# Patient Record
Sex: Female | Born: 1965 | Race: Black or African American | Hispanic: No | Smoking: Never smoker
Health system: Southern US, Community
[De-identification: ages and names within clinical notes are randomized; demographics above are authoritative.]

## PROBLEM LIST (undated history)

## (undated) DIAGNOSIS — F32A Depression, unspecified: Secondary | ICD-10-CM

## (undated) DIAGNOSIS — F419 Anxiety disorder, unspecified: Secondary | ICD-10-CM

## (undated) DIAGNOSIS — I10 Essential (primary) hypertension: Secondary | ICD-10-CM

## (undated) DIAGNOSIS — E119 Type 2 diabetes mellitus without complications: Secondary | ICD-10-CM

## (undated) DIAGNOSIS — F329 Major depressive disorder, single episode, unspecified: Secondary | ICD-10-CM

## (undated) DIAGNOSIS — M1990 Unspecified osteoarthritis, unspecified site: Secondary | ICD-10-CM

## (undated) DIAGNOSIS — E7800 Pure hypercholesterolemia, unspecified: Secondary | ICD-10-CM

## (undated) HISTORY — PX: TONSILLECTOMY: SUR1361

## (undated) HISTORY — DX: Essential (primary) hypertension: I10

## (undated) HISTORY — DX: Major depressive disorder, single episode, unspecified: F32.9

## (undated) HISTORY — DX: Anxiety disorder, unspecified: F41.9

## (undated) HISTORY — DX: Unspecified osteoarthritis, unspecified site: M19.90

## (undated) HISTORY — DX: Depression, unspecified: F32.A

## (undated) HISTORY — PX: TUBAL LIGATION: SHX77

---

## 1989-02-01 HISTORY — PX: ANKLE SURGERY: SHX546

## 2005-02-15 ENCOUNTER — Emergency Department (HOSPITAL_COMMUNITY): Admission: EM | Admit: 2005-02-15 | Discharge: 2005-02-15 | Payer: Self-pay | Admitting: Emergency Medicine

## 2005-02-25 ENCOUNTER — Ambulatory Visit (HOSPITAL_COMMUNITY): Admission: RE | Admit: 2005-02-25 | Discharge: 2005-02-25 | Payer: Self-pay | Admitting: Orthopaedic Surgery

## 2012-09-06 ENCOUNTER — Ambulatory Visit (INDEPENDENT_AMBULATORY_CARE_PROVIDER_SITE_OTHER): Payer: BC Managed Care – PPO | Admitting: Family Medicine

## 2012-09-06 ENCOUNTER — Encounter: Payer: Self-pay | Admitting: Family Medicine

## 2012-09-06 VITALS — BP 150/90 | HR 93 | Resp 18 | Ht 63.0 in | Wt 249.1 lb

## 2012-09-06 DIAGNOSIS — M1610 Unilateral primary osteoarthritis, unspecified hip: Secondary | ICD-10-CM

## 2012-09-06 DIAGNOSIS — IMO0002 Reserved for concepts with insufficient information to code with codable children: Secondary | ICD-10-CM

## 2012-09-06 DIAGNOSIS — R7309 Other abnormal glucose: Secondary | ICD-10-CM

## 2012-09-06 DIAGNOSIS — R7303 Prediabetes: Secondary | ICD-10-CM

## 2012-09-06 DIAGNOSIS — E8881 Metabolic syndrome: Secondary | ICD-10-CM

## 2012-09-06 DIAGNOSIS — I10 Essential (primary) hypertension: Secondary | ICD-10-CM

## 2012-09-06 DIAGNOSIS — Z96642 Presence of left artificial hip joint: Secondary | ICD-10-CM | POA: Insufficient documentation

## 2012-09-06 DIAGNOSIS — M170 Bilateral primary osteoarthritis of knee: Secondary | ICD-10-CM | POA: Insufficient documentation

## 2012-09-06 DIAGNOSIS — Z23 Encounter for immunization: Secondary | ICD-10-CM

## 2012-09-06 DIAGNOSIS — Z0000 Encounter for general adult medical examination without abnormal findings: Secondary | ICD-10-CM | POA: Insufficient documentation

## 2012-09-06 DIAGNOSIS — M160 Bilateral primary osteoarthritis of hip: Secondary | ICD-10-CM

## 2012-09-06 DIAGNOSIS — N3000 Acute cystitis without hematuria: Secondary | ICD-10-CM

## 2012-09-06 DIAGNOSIS — M169 Osteoarthritis of hip, unspecified: Secondary | ICD-10-CM

## 2012-09-06 DIAGNOSIS — Z1239 Encounter for other screening for malignant neoplasm of breast: Secondary | ICD-10-CM

## 2012-09-06 DIAGNOSIS — M1710 Unilateral primary osteoarthritis, unspecified knee: Secondary | ICD-10-CM

## 2012-09-06 DIAGNOSIS — Z01818 Encounter for other preprocedural examination: Secondary | ICD-10-CM

## 2012-09-06 DIAGNOSIS — E785 Hyperlipidemia, unspecified: Secondary | ICD-10-CM

## 2012-09-06 LAB — POCT URINALYSIS DIPSTICK
Bilirubin, UA: NEGATIVE
Blood, UA: NEGATIVE
Glucose, UA: NEGATIVE
Ketones, UA: NEGATIVE
pH, UA: 6

## 2012-09-06 LAB — CBC
MCH: 28.8 pg (ref 26.0–34.0)
Platelets: 380 10*3/uL (ref 150–400)
RDW: 15.1 % (ref 11.5–15.5)

## 2012-09-06 MED ORDER — TRIAMTERENE-HCTZ 37.5-25 MG PO TABS: 1.0000 | ORAL_TABLET | Freq: Every day | ORAL | Status: DC

## 2012-09-06 MED ORDER — HYDROCODONE-ACETAMINOPHEN 7.5-300 MG PO TABS: 1.0000 | ORAL_TABLET | Freq: Three times a day (TID) | ORAL | Status: DC

## 2012-09-06 NOTE — Patient Instructions (Addendum)
F/u in  [facility] September.  Pls get CXR today  CCUA today , also cbc, chem 7, lipid, HBA1c, TSH and Vit D  You are referred for a mammogram  New for blood pressure is maxzide one daily.    EKG today

## 2012-09-06 NOTE — Progress Notes (Signed)
  Subjective:    Patient ID: Tami Pearson, female    DOB: 1965/08/31, 47 y.o.   MRN: [id number]  HPI Pt in to re  establish care.Was seen for  A brief period over 5 years ago.   Has a h/o HTN but has been off medication for years, states due to lack of insurance. Now has been getting injections in her knee for instability, with no improvement, evaluation by ortho needs bilateral hip replacement, and possibly knee replacements. Chronic uncontrolled pain with instability, very poor quality of life. Excessive weight gain and no exercise. Here also for pre op evaluation    Review of Systems See HPI Denies recent fever or chills. Denies sinus pressure, nasal congestion, ear pain or sore throat. Denies chest congestion, productive cough or wheezing. Denies chest pains, palpitations and leg swelling Denies abdominal pain, nausea, vomiting,diarrhea or constipation.   Denies dysuria, frequency, hesitancy or incontinence.  Denies headaches, seizures, numbness, or tingling. Denies uncontrolled  depression, does have  Insomnia from pain Denies skin break down or rash.        Objective:   Physical Exam Patient alert and oriented and in no cardiopulmonary distress.  HEENT: No facial asymmetry, EOMI, no sinus tenderness,  oropharynx pink and moist.  Neck supple no adenopathy.  Chest: Clear to auscultation bilaterally.  CVS: S1, S2 no murmurs, no S3.  ABD: Soft non tender. Bowel sounds normal.  Ext: No edema  NW:GNFAOZHYQ ROM spine,  hips and knees.  Skin: Intact, no ulcerations or rash noted.  Psych: Good eye contact, normal affect. Memory intact not anxious or depressed appearing.  CNS: CN 2-12 intact, power, tone and sensation normal throughout.        Assessment & Plan:

## 2012-09-07 LAB — LIPID PANEL
HDL: 48 mg/dL (ref >39)
LDL Cholesterol: 128 mg/dL — ABNORMAL HIGH (ref 0–99)
Total CHOL/HDL Ratio: 4 Ratio
VLDL: 15 mg/dL (ref 0–40)

## 2012-09-07 LAB — BASIC METABOLIC PANEL
Chloride: 104 mEq/L (ref 96–112)
Potassium: 3.8 mEq/L (ref 3.5–5.3)

## 2012-09-07 LAB — TSH: TSH: 1.185 u[IU]/mL (ref 0.350–4.500)

## 2012-09-07 LAB — HEMOGLOBIN A1C: Hgb A1c MFr Bld: 6.1 % — ABNORMAL HIGH (ref <5.7)

## 2012-09-07 LAB — VITAMIN D 25 HYDROXY (VIT D DEFICIENCY, FRACTURES): Vit D, 25-Hydroxy: 27 ng/mL — ABNORMAL LOW (ref 30–89)

## 2012-09-09 LAB — URINE CULTURE

## 2012-09-10 ENCOUNTER — Other Ambulatory Visit: Payer: Self-pay | Admitting: Family Medicine

## 2012-09-10 DIAGNOSIS — E8881 Metabolic syndrome: Secondary | ICD-10-CM | POA: Insufficient documentation

## 2012-09-10 DIAGNOSIS — E785 Hyperlipidemia, unspecified: Secondary | ICD-10-CM | POA: Insufficient documentation

## 2012-09-10 DIAGNOSIS — E1169 Type 2 diabetes mellitus with other specified complication: Secondary | ICD-10-CM | POA: Insufficient documentation

## 2012-09-10 HISTORY — DX: Metabolic syndrome: E88.810

## 2012-09-10 HISTORY — DX: Metabolic syndrome: E88.81

## 2012-09-10 NOTE — Assessment & Plan Note (Signed)
Severe, limitation in mobility, surgery scheduled for the Fall

## 2012-09-10 NOTE — Assessment & Plan Note (Signed)
Needs to focus on reduced caloric intake for weight loss, unable to exercise due to severe joint disease

## 2012-09-10 NOTE — Assessment & Plan Note (Addendum)
EKG shows nom ischemic changes, and she is in NSR Urinalysis mildly abn sent for c/s

## 2012-09-10 NOTE — Assessment & Plan Note (Signed)
Needs individual counseling with emphasis on low carb diet and low cal diet for weight loss, metformin would also be beneficial will encourage this also on f/u call with resultss

## 2012-09-10 NOTE — Assessment & Plan Note (Signed)
Uncontrolled, pt to start medication DASH diet and commitment to daily physical activity for a minimum of 30 minutes discussed and encouraged, as a part of hypertension management. The importance of attaining a healthy weight is also discussed.

## 2012-09-10 NOTE — Assessment & Plan Note (Signed)
Needs to lose weight , lower carb and fat intake to improve health and reduce cv risk

## 2012-09-10 NOTE — Assessment & Plan Note (Signed)
elervtaed LDL, needs to lower fat intake

## 2012-09-10 NOTE — Assessment & Plan Note (Signed)
Severe, limiting mobility, being followed by orhto

## 2012-09-13 ENCOUNTER — Other Ambulatory Visit: Payer: Self-pay

## 2012-09-13 MED ORDER — METFORMIN HCL 500 MG PO TABS: 500.0000 mg | ORAL_TABLET | Freq: Every day | ORAL | Status: DC

## 2012-09-19 ENCOUNTER — Encounter: Payer: Self-pay | Admitting: Family Medicine

## 2012-09-22 ENCOUNTER — Telehealth: Payer: Self-pay

## 2012-09-22 NOTE — Telephone Encounter (Signed)
She does want to do the 10mg  and pick up wed

## 2012-09-22 NOTE — Telephone Encounter (Signed)
Left hip pain- hurting so bad and can't sleep at night and has even increased her hydrocodone to four times a day as its still really bothering her. Is there anything else that can be called in or a stronger dose of pain med that can be given. Has to have surgery Oct 15.  [facility] in [place]  [phone]

## 2012-09-22 NOTE — Telephone Encounter (Signed)
I can increase her to the 10mg  hydrocodone dose, but this cannot start before next Wednesday, since she recently was prescribed 90 tablets

## 2012-09-26 ENCOUNTER — Other Ambulatory Visit: Payer: Self-pay | Admitting: Family Medicine

## 2012-09-26 MED ORDER — HYDROCODONE-ACETAMINOPHEN 10-325 MG PO TABS: ORAL_TABLET | ORAL | Status: DC

## 2012-09-26 NOTE — Telephone Encounter (Signed)
Script printed and signed , pt may collect on 09/27/2012

## 2012-09-27 NOTE — Telephone Encounter (Signed)
Script sent in per dr to [facility] in [place]

## 2012-10-18 ENCOUNTER — Ambulatory Visit: Payer: BC Managed Care – PPO | Admitting: Family Medicine

## 2012-10-19 ENCOUNTER — Encounter: Payer: Self-pay | Admitting: Family Medicine

## 2012-10-19 ENCOUNTER — Ambulatory Visit (INDEPENDENT_AMBULATORY_CARE_PROVIDER_SITE_OTHER): Payer: BC Managed Care – PPO | Admitting: Family Medicine

## 2012-10-19 ENCOUNTER — Ambulatory Visit (HOSPITAL_COMMUNITY)
Admission: RE | Admit: 2012-10-19 | Discharge: 2012-10-19 | Disposition: A | Discharge status: Home / Self Care | Payer: BC Managed Care – PPO | Source: Ambulatory Visit | Attending: Family Medicine | Admitting: Family Medicine

## 2012-10-19 VITALS — BP 140/96 | HR 98 | Resp 16 | Wt 241.4 lb

## 2012-10-19 DIAGNOSIS — R7303 Prediabetes: Secondary | ICD-10-CM

## 2012-10-19 DIAGNOSIS — I10 Essential (primary) hypertension: Secondary | ICD-10-CM

## 2012-10-19 DIAGNOSIS — M169 Osteoarthritis of hip, unspecified: Secondary | ICD-10-CM

## 2012-10-19 DIAGNOSIS — Z1231 Encounter for screening mammogram for malignant neoplasm of breast: Secondary | ICD-10-CM | POA: Insufficient documentation

## 2012-10-19 DIAGNOSIS — Z1239 Encounter for other screening for malignant neoplasm of breast: Secondary | ICD-10-CM

## 2012-10-19 DIAGNOSIS — M160 Bilateral primary osteoarthritis of hip: Secondary | ICD-10-CM

## 2012-10-19 DIAGNOSIS — R7309 Other abnormal glucose: Secondary | ICD-10-CM

## 2012-10-19 MED ORDER — HYDROCODONE-ACETAMINOPHEN 10-325 MG PO TABS: 1.0000 | ORAL_TABLET | Freq: Four times a day (QID) | ORAL | Status: DC | PRN

## 2012-10-19 MED ORDER — KETOROLAC TROMETHAMINE 60 MG/2ML IJ SOLN
60.0000 mg | Freq: Once | INTRAMUSCULAR | Status: AC
  Administered 2012-10-19: 60 mg via INTRAMUSCULAR

## 2012-10-19 MED ORDER — TRIAMTERENE-HCTZ 75-50 MG PO TABS: 1.0000 | ORAL_TABLET | Freq: Every day | ORAL | Status: DC

## 2012-10-19 NOTE — Progress Notes (Signed)
  Subjective:    Patient ID: [name], female    DOB: [birth year], 47 y.o.   MRN: [id number]  HPI  Pt in primarily for re eval of HTn as needs this controlled for upcoming hip replacement. No adverse s/e with medications noted. Still has disabling hip pain and requests med adjustment  Review of Systems See HPI Denies recent fever or chills. Denies sinus pressure, nasal congestion, ear pain or sore throat. Denies chest congestion, productive cough or wheezing. Denies chest pains, palpitations and leg swelling Denies abdominal pain, nausea, vomiting,diarrhea or constipation.   Denies dysuria, frequency, hesitancy or incontinence.        Objective:   Physical Exam Patient alert and oriented and in no cardiopulmonary distress.Pt in pain  HEENT: No facial asymmetry, EOMI, no sinus tenderness,  oropharynx pink and moist.  Neck supple no adenopathy.  Chest: Clear to auscultation bilaterally.  CVS: S1, S2 no murmurs, no S3.  ABD: Soft non tender. Bowel sounds normal.  Ext: No edema  MS: Decreased ROM  hips and knees.  Skin: Intact, no ulcerations or rash noted.  Psych: Good eye contact, normal affect. Memory intact not anxious or depressed appearing.  CNS: CN 2-12 intact, power, tone and sensation normal throughout.        Assessment & Plan:

## 2012-10-19 NOTE — Patient Instructions (Addendum)
F/u in 2 weeks , call if you need me before  New direction for pain medication is one tablet every 6 hours, (4 times daily, start today)  New higher dose of blood pressure medication. Take TWO 25mg  maxzide tablets once daily, starting tomorrow , until done, your new tablet is maxzide 50mg  ONE daily  Toradol 60mg  Im in the office today for pain  Flu vaccine today

## 2012-10-21 NOTE — Assessment & Plan Note (Signed)
disablinhg , has replacement scheduled for 10/15, increase dose of med, toradol in office today

## 2012-10-21 NOTE — Assessment & Plan Note (Signed)
uncontrolled increase dose of medication, 2 week f/u

## 2012-10-21 NOTE — Assessment & Plan Note (Signed)
Tolerating metformin with excellent weight loss , continue same

## 2012-10-27 ENCOUNTER — Other Ambulatory Visit: Payer: Self-pay | Admitting: Orthopedic Surgery

## 2012-10-27 NOTE — H&P (Signed)
Tami Pearson  DOB: [birth year] Married / Language: English / Race: Black or African American Female  Date of Admission:  11/15/2012  Chief Complaint: Left Hip Pain  History of Present Illness The patient is a 47 year old female who comes in for a preoperative History and Physical. The patient is scheduled for a left total hip arthroplasty to be performed by Tami. Gus Pearson. Tami Pearson at [facility] on 11/15/2012. The patient is a 47 year old female who presents with a hip problem. The patient is seen in referral from Tami Pearson in [place].The patient reports left hip and right hip problems including pain, giving way (left), catching and stiffness symptoms that have been present for year(s). The symptoms began without any known injury. Symptoms reported include hip pain, pain with weightbearing, difficulty flexing hip, difficulty rotating hip and difficulty bearing weightThe symptoms are described as severe.The patient feels as if their symptoms are does feel they are worsening. Symptoms are exacerbated by movement, flexing hip and weight bearing. Symptoms are not relieved by non-opioid analgesics (Tylenol). Prior to being seen today the patient was previously evaluated by an out of town physician (Tami Pearson). Previous workup for this problem has included hip x-rays (Tami Tami Pearson office). Previous treatment for this problem has included oral corticosteroids. Tami Pearson comes in for evaluation of both hips. They have been hurting for years but was progressivley gotten worse with time. She ahs been seen and treated and thought the pain was initially coming form her back but through workup and evaluation, it was determined that the pain has been coming from her hips. She has been treated with medications including analgesics and oral steroid medications without benefit. She has also undergone intraarticular cortisone injections into both hips. She states the injections helped  only for a couple of days and then the pain came right back. She was referred over to Tami. Lequita Pearson by Tami. Eduard Pearson and is her to get her hips evaulated and discuss surgery. 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 She has not missed work but the pain has interferred with her walking and house work. The left hip is the worse of the two and now it has started to give out on her. She can't walk or even stand for any length of time. She has night pain, trouble getting in and out of the car, more difficulty with shoes and socks, and bending. She is ready to get the hip fixed at this time.  They have been treated conservatively in the past for the above stated problem and despite conservative measures, they continue to have progressive pain and severe functional limitations and dysfunction. They have failed non-operative management including home exercise, medications. It is felt that they would benefit from undergoing total joint replacement. Risks and benefits of the procedure have been discussed with the patient and they elect to proceed with surgery. There are no active contraindications to surgery such as ongoing infection or rapidly progressive neurological disease.   Problem List Osteoarthritis, Hip (715.35)   Allergies Ibuprofen *ANALGESICS - ANTI-INFLAMMATORY*. Vomiting.   Family History Hypertension. mother Osteoarthritis. mother Diabetes Mellitus. mother   Social History Marital status. married Number of flights of stairs before winded. 1 Living situation. live with parents Exercise. Exercises rarely; does running / walking Illicit drug use. no Pain Contract. no Tobacco / smoke exposure. no Tobacco use. never smoker Drug/Alcohol Rehab (Currently). no Drug/Alcohol Rehab (Previously). no Current work status. working full time Alcohol use.  never consumed alcohol   Medication History MetFORMIN HCl (500MG  Tablet, Oral)  Active. Hydrocodone-Acetaminophen (10-325MG  Tablet, Oral) Active. Triamterene-HCTZ (75-50MG  Tablet, Oral) Active.   Past Surgical History Ankle Surgery. Date: 10. right Tubal Ligation. Date: 33. Hardware Removal Right Ankle   Medical History High blood pressure Osteoarthritis Diet-Controlled Diabetes Mellitus   Review of Systems General:Not Present- Chills, Fever, Night Sweats, Fatigue, Weight Gain, Weight Loss and Memory Loss. Skin:Not Present- Hives, Itching, Rash, Eczema and Lesions. HEENT:Not Present- Tinnitus, Headache, Double Vision, Visual Loss, Hearing Loss and Dentures. Respiratory:Not Present- Shortness of breath with exertion, Shortness of breath at rest, Allergies, Coughing up blood and Chronic Cough. Cardiovascular:Not Present- Chest Pain, Racing/skipping heartbeats, Difficulty Breathing Lying Down, Murmur, Swelling and Palpitations. Gastrointestinal:Not Present- Bloody Stool, Heartburn, Abdominal Pain, Vomiting, Nausea, Constipation, Diarrhea, Difficulty Swallowing, Jaundice and Loss of appetitie. Female Genitourinary:Not Present- Blood in Urine, Urinary frequency, Weak urinary stream, Discharge, Flank Pain, Incontinence, Painful Urination, Urgency, Urinary Retention and Urinating at Night. Musculoskeletal:Present- Joint Pain. Not Present- Muscle Weakness, Muscle Pain, Joint Swelling, Back Pain, Morning Stiffness and Spasms. Neurological:Not Present- Tremor, Dizziness, Blackout spells, Paralysis, Difficulty with balance and Weakness. Psychiatric:Not Present- Insomnia.   Vitals Pulse: 72 (Regular) Resp.: 16 (Unlabored) BP: 138/92 (Sitting, Right Arm, Standard)    Physical Exam The physical exam findings are as follows:   General Mental Status - Alert, cooperative and good historian. General Appearance- pleasant. Not in acute distress. Orientation- Oriented X3. Build & Nutrition- Overweight, Obese and Well developed.   Head  and Neck Head- normocephalic, atraumatic . Neck Global Assessment- supple. no bruit auscultated on the right and no bruit auscultated on the left.   Eye Pupil- Bilateral- Regular and Round. Motion- Bilateral- EOMI.   Chest and Lung Exam Auscultation: Breath sounds:- clear at anterior chest wall and - clear at posterior chest wall. Adventitious sounds:- No Adventitious sounds.   Cardiovascular Auscultation:Rhythm- Regular rate and rhythm. Heart Sounds- S1 WNL and S2 WNL. Murmurs & Other Heart Sounds:Auscultation of the heart reveals - No Murmurs.   Abdomen Palpation/Percussion:Tenderness- Abdomen is non-tender to palpation. Rigidity (guarding)- Abdomen is soft. Auscultation:Auscultation of the abdomen reveals - Bowel sounds normal.   Female Genitourinary  Not done, not pertinent to present illness  Musculoskeletal Well developed female alert and oriented in no apparent distress. Evaluation of her left hip flexion 90, no internal or external rotation, only about 5 degrees of abduction. Right hip flexion 110, rotation in 10, out 20, abduction 20 with discomfort. She does not have any trochanteric tenderness on either side. Bilateral knee exams are unremarkable except for slight anterior tenderness on the left knee. There is no effusion in either knee. There is no instability noted. Pulses, sensation and motor are intact.  RADIOGRAPHS: AP pelvis, lateral of both hips show severe endstage arthritis in both hips, left being worse than the right. She has severe bone on bone changes with bony erosion of the femoral head. She has large osteophytes present. The femoral head morphology still intact in the right hip but there is bone on bone change with osteophyte formation.  Assessment & Plan Osteoarthritis, Hip (715.35) Impression: Left Hip Current Plans l Pt Education - How to access health information online: discussed with patient and provided  information.  Note: Plan is for a Left Total Hip Replacement by Tami. Lequita Pearson.  Plan is to go home with family.  PCP - Tami. Syliva Overman  The patient does not have any contraindications and will receive TXA (tranexamic acid) prior to surgery.  Signed electronically by Lauraine Rinne, III PA-C

## 2012-10-30 ENCOUNTER — Other Ambulatory Visit: Payer: Self-pay | Admitting: Orthopedic Surgery

## 2012-11-02 ENCOUNTER — Encounter: Payer: Self-pay | Admitting: Family Medicine

## 2012-11-02 ENCOUNTER — Ambulatory Visit (INDEPENDENT_AMBULATORY_CARE_PROVIDER_SITE_OTHER): Payer: BC Managed Care – PPO | Admitting: Family Medicine

## 2012-11-02 VITALS — BP 130/92 | HR 76 | Resp 18 | Ht 63.0 in | Wt 238.0 lb

## 2012-11-02 DIAGNOSIS — R7303 Prediabetes: Secondary | ICD-10-CM

## 2012-11-02 DIAGNOSIS — R7309 Other abnormal glucose: Secondary | ICD-10-CM

## 2012-11-02 DIAGNOSIS — I10 Essential (primary) hypertension: Secondary | ICD-10-CM

## 2012-11-02 NOTE — Patient Instructions (Addendum)
CPE and pap mid January, call if you need me before  All the best with your upcoming surgery, I will send a clearance note to your surgeon  No change in blood pressure medication and metformin  Non fast HBA1C and chem 7 in January before follow up visit

## 2012-11-02 NOTE — Assessment & Plan Note (Signed)
Imoroved, pt cleared for surgery, will send note

## 2012-11-04 NOTE — Assessment & Plan Note (Signed)
Improved. Pt applauded on succesful weight loss through lifestyle change, and encouraged to continue same. Weight loss goal set for the next several months.  

## 2012-11-04 NOTE — Progress Notes (Signed)
  Subjective:    Patient ID: [name], female    DOB: Jun 18, 1965, 47 y.o.   MRN: [id number]  HPI Pt in for re evaluation of uncontrolled BP prior to hip replacement in the next 2 weeks. She has tolerated her increased med dose with no adverse side effects. She reports adequate pain control with current meds   Review of Systems See HPI Denies recent fever or chills. Denies sinus pressure, nasal congestion, ear pain or sore throat. Denies chest congestion, productive cough or wheezing. Denies chest pains, palpitations and leg swelling Denies abdominal pain, nausea, vomiting,diarrhea or constipation.   Denies dysuria, frequency, hesitancy or incontinence.        Objective:   Physical Exam  Patient alert and oriented and in no cardiopulmonary distress.  HEENT: No facial asymmetry, EOMI, no sinus tenderness,  oropharynx pink and moist.  Neck supple no adenopathy.  Chest: Clear to auscultation bilaterally.  CVS: S1, S2 no murmurs, no S3.  ABD: Soft non tender. Bowel sounds normal.  Ext: No edema  MS: decreased  ROM spine,, hips and knees.  Skin: Intact, no ulcerations or rash noted.  Psych: Good eye contact, normal affect. Memory intact not anxious or depressed appearing.  CNS: CN 2-12 intact, power, tone and sensation normal throughout.       Assessment & Plan:

## 2012-11-06 ENCOUNTER — Other Ambulatory Visit (HOSPITAL_COMMUNITY): Payer: Self-pay | Admitting: Orthopedic Surgery

## 2012-11-06 ENCOUNTER — Encounter (HOSPITAL_COMMUNITY): Payer: Self-pay | Admitting: Pharmacy Technician

## 2012-11-06 NOTE — Patient Instructions (Addendum)
20 Tami Pearson  11/06/2012   Your procedure is scheduled on:  11/15/12  [facility]  Report to [facility] at  0600     AM.  Call this number if you have problems the morning of surgery: [phone]       Remember:   Do not eat food  Or drink :After Midnight. Tuesday NIGHT   Take these medicines the morning of surgery with A SIP OF WATER: MAY TAKE HYDROCODONE IF NEEDED              DO NOT TAKE ANY DIABETES MEDICINE MORNING OF SURGERY  .  Contacts, dentures or partial plates can not be worn to surgery  Leave suitcase in the car. After surgery it may be brought to your room.  For patients admitted to the hospital, checkout time is 11:00 AM day of  discharge.             SPECIAL INSTRUCTIONS- SEE [place] PREPARING FOR SURGERY INSTRUCTION SHEET-     DO NOT WEAR JEWELRY, LOTIONS, POWDERS, OR PERFUMES.  WOMEN-- DO NOT SHAVE LEGS OR UNDERARMS FOR 12 HOURS BEFORE SHOWERS. MEN MAY SHAVE FACE.  Patients discharged the day of surgery will not be allowed to drive home. IF going home the day of surgery, you must have a driver and someone to stay with you for the first 24 hours  Name and phone number of your driver:    ADMISSION                                                                    Please read over the following fact sheets that you were given: MRSA Information, Incentive Spirometry Sheet, Blood Transfusion Sheet  Information                                                                                    Telford Nab.Tami Fojtik, RN,BSN PST 336  [id number]                 FAILURE TO FOLLOW THESE INSTRUCTIONS MAY RESULT IN  CANCELLATION   OF YOUR SURGERY                                                  Patient Signature _____________________________

## 2012-11-07 ENCOUNTER — Ambulatory Visit (HOSPITAL_COMMUNITY)
Admission: RE | Admit: 2012-11-07 | Discharge: 2012-11-07 | Disposition: A | Discharge status: Home / Self Care | Payer: BC Managed Care – PPO | Source: Ambulatory Visit | Attending: Orthopedic Surgery | Admitting: Orthopedic Surgery

## 2012-11-07 ENCOUNTER — Encounter (HOSPITAL_COMMUNITY)
Admission: RE | Admit: 2012-11-07 | Discharge: 2012-11-07 | Disposition: A | Discharge status: Home / Self Care | Payer: BC Managed Care – PPO | Source: Ambulatory Visit | Attending: Orthopedic Surgery | Admitting: Orthopedic Surgery

## 2012-11-07 ENCOUNTER — Encounter (HOSPITAL_COMMUNITY): Payer: Self-pay

## 2012-11-07 DIAGNOSIS — M169 Osteoarthritis of hip, unspecified: Secondary | ICD-10-CM | POA: Insufficient documentation

## 2012-11-07 DIAGNOSIS — E119 Type 2 diabetes mellitus without complications: Secondary | ICD-10-CM | POA: Insufficient documentation

## 2012-11-07 DIAGNOSIS — I10 Essential (primary) hypertension: Secondary | ICD-10-CM | POA: Insufficient documentation

## 2012-11-07 DIAGNOSIS — M5137 Other intervertebral disc degeneration, lumbosacral region: Secondary | ICD-10-CM | POA: Insufficient documentation

## 2012-11-07 DIAGNOSIS — M1610 Unilateral primary osteoarthritis, unspecified hip: Secondary | ICD-10-CM | POA: Insufficient documentation

## 2012-11-07 DIAGNOSIS — M51379 Other intervertebral disc degeneration, lumbosacral region without mention of lumbar back pain or lower extremity pain: Secondary | ICD-10-CM | POA: Insufficient documentation

## 2012-11-07 DIAGNOSIS — Z01812 Encounter for preprocedural laboratory examination: Secondary | ICD-10-CM | POA: Insufficient documentation

## 2012-11-07 DIAGNOSIS — I517 Cardiomegaly: Secondary | ICD-10-CM | POA: Insufficient documentation

## 2012-11-07 HISTORY — DX: Type 2 diabetes mellitus without complications: E11.9

## 2012-11-07 LAB — SURGICAL PCR SCREEN
MRSA, PCR: NEGATIVE
Staphylococcus aureus: NEGATIVE

## 2012-11-07 LAB — PROTIME-INR: Prothrombin Time: 13.1 seconds (ref 11.6–15.2)

## 2012-11-07 LAB — CBC
MCHC: 33 g/dL (ref 30.0–36.0)
RBC: 4.23 MIL/uL (ref 3.87–5.11)
RDW: 12.9 % (ref 11.5–15.5)

## 2012-11-07 LAB — COMPREHENSIVE METABOLIC PANEL
ALT: 9 U/L (ref 0–35)
AST: 14 U/L (ref 0–37)
Albumin: 3.4 g/dL — ABNORMAL LOW (ref 3.5–5.2)
Alkaline Phosphatase: 66 U/L (ref 39–117)
BUN: 19 mg/dL (ref 6–23)
Potassium: 3.4 mEq/L — ABNORMAL LOW (ref 3.5–5.1)
Sodium: 136 mEq/L (ref 135–145)
Total Protein: 9.1 g/dL — ABNORMAL HIGH (ref 6.0–8.3)

## 2012-11-07 LAB — URINALYSIS, ROUTINE W REFLEX MICROSCOPIC
Bilirubin Urine: NEGATIVE
Nitrite: NEGATIVE
Protein, ur: NEGATIVE mg/dL
Specific Gravity, Urine: 1.011 (ref 1.005–1.030)
Urobilinogen, UA: 0.2 mg/dL (ref 0.0–1.0)
pH: 6.5 (ref 5.0–8.0)

## 2012-11-07 LAB — URINE MICROSCOPIC-ADD ON

## 2012-11-07 LAB — HCG, SERUM, QUALITATIVE: Preg, Serum: NEGATIVE

## 2012-11-07 LAB — APTT: aPTT: 30 seconds (ref 24–37)

## 2012-11-08 ENCOUNTER — Telehealth: Payer: Self-pay | Admitting: Family Medicine

## 2012-11-08 LAB — URINE CULTURE

## 2012-11-08 NOTE — Telephone Encounter (Signed)
Sent over

## 2012-11-08 NOTE — Telephone Encounter (Signed)
Please advise 

## 2012-11-08 NOTE — Telephone Encounter (Signed)
Script written , pls fax and let her know 

## 2012-11-08 NOTE — Telephone Encounter (Signed)
Called patient.  Unable to leave message.  Will try again to get DME supplier choice.

## 2012-11-08 NOTE — Telephone Encounter (Signed)
Wants an rx for a bath chair also sent to [facility]

## 2012-11-08 NOTE — Telephone Encounter (Signed)
Signed pls send

## 2012-11-08 NOTE — Telephone Encounter (Signed)
Wants it faxed to [facility]

## 2012-11-15 ENCOUNTER — Encounter (HOSPITAL_COMMUNITY): Payer: BC Managed Care – PPO | Admitting: Registered Nurse

## 2012-11-15 ENCOUNTER — Inpatient Hospital Stay (HOSPITAL_COMMUNITY): Payer: BC Managed Care – PPO | Admitting: Registered Nurse

## 2012-11-15 ENCOUNTER — Inpatient Hospital Stay (HOSPITAL_COMMUNITY)
Admission: RE | Admit: 2012-11-15 | Discharge: 2012-11-19 | DRG: 470 | Disposition: A | Discharge status: Home Health | Payer: BC Managed Care – PPO | Source: Ambulatory Visit | Attending: Orthopedic Surgery | Admitting: Orthopedic Surgery

## 2012-11-15 ENCOUNTER — Inpatient Hospital Stay (HOSPITAL_COMMUNITY): Payer: BC Managed Care – PPO

## 2012-11-15 ENCOUNTER — Encounter (HOSPITAL_COMMUNITY)
Admission: RE | Disposition: A | Discharge status: Home Health | Payer: Self-pay | Source: Ambulatory Visit | Attending: Orthopedic Surgery

## 2012-11-15 ENCOUNTER — Encounter (HOSPITAL_COMMUNITY): Payer: Self-pay | Admitting: *Deleted

## 2012-11-15 DIAGNOSIS — E669 Obesity, unspecified: Secondary | ICD-10-CM | POA: Diagnosis present

## 2012-11-15 DIAGNOSIS — Z79899 Other long term (current) drug therapy: Secondary | ICD-10-CM

## 2012-11-15 DIAGNOSIS — M169 Osteoarthritis of hip, unspecified: Secondary | ICD-10-CM | POA: Diagnosis present

## 2012-11-15 DIAGNOSIS — E871 Hypo-osmolality and hyponatremia: Secondary | ICD-10-CM

## 2012-11-15 DIAGNOSIS — I10 Essential (primary) hypertension: Secondary | ICD-10-CM | POA: Diagnosis present

## 2012-11-15 DIAGNOSIS — E119 Type 2 diabetes mellitus without complications: Secondary | ICD-10-CM | POA: Diagnosis present

## 2012-11-15 DIAGNOSIS — E876 Hypokalemia: Secondary | ICD-10-CM

## 2012-11-15 DIAGNOSIS — Z96649 Presence of unspecified artificial hip joint: Secondary | ICD-10-CM

## 2012-11-15 DIAGNOSIS — Z6841 Body Mass Index (BMI) 40.0 and over, adult: Secondary | ICD-10-CM

## 2012-11-15 DIAGNOSIS — M1610 Unilateral primary osteoarthritis, unspecified hip: Principal | ICD-10-CM | POA: Diagnosis present

## 2012-11-15 DIAGNOSIS — D62 Acute posthemorrhagic anemia: Secondary | ICD-10-CM

## 2012-11-15 HISTORY — PX: TOTAL HIP ARTHROPLASTY: SHX124

## 2012-11-15 LAB — TYPE AND SCREEN
ABO/RH(D): B POS
Antibody Screen: NEGATIVE

## 2012-11-15 LAB — GLUCOSE, CAPILLARY
Glucose-Capillary: 138 mg/dL — ABNORMAL HIGH (ref 70–99)
Glucose-Capillary: 119 mg/dL — ABNORMAL HIGH (ref 70–99)

## 2012-11-15 SURGERY — ARTHROPLASTY, HIP, TOTAL,POSTERIOR APPROACH
Anesthesia: Spinal | Site: Hip | Laterality: Left | Wound class: Clean

## 2012-11-15 MED ORDER — CEFAZOLIN SODIUM-DEXTROSE 2-3 GM-% IV SOLR
INTRAVENOUS | Status: AC
  Filled 2012-11-15: qty 50

## 2012-11-15 MED ORDER — RIVAROXABAN 10 MG PO TABS
10.0000 mg | ORAL_TABLET | Freq: Every day | ORAL | Status: DC
  Administered 2012-11-16 – 2012-11-19 (×4): 10 mg via ORAL
  Filled 2012-11-15 (×5): qty 1

## 2012-11-15 MED ORDER — TRANEXAMIC ACID 100 MG/ML IV SOLN
1000.0000 mg | INTRAVENOUS | Status: AC
  Administered 2012-11-15: 1000 mg via INTRAVENOUS
  Filled 2012-11-15: qty 10

## 2012-11-15 MED ORDER — LACTATED RINGERS IV SOLN
INTRAVENOUS | Status: DC | PRN
  Administered 2012-11-15 (×3): via INTRAVENOUS

## 2012-11-15 MED ORDER — MENTHOL 3 MG MT LOZG: 1.0000 | LOZENGE | OROMUCOSAL | Status: DC | PRN

## 2012-11-15 MED ORDER — PHENOL 1.4 % MT LIQD: 1.0000 | OROMUCOSAL | Status: DC | PRN

## 2012-11-15 MED ORDER — MORPHINE SULFATE 2 MG/ML IJ SOLN
1.0000 mg | INTRAMUSCULAR | Status: DC | PRN
  Administered 2012-11-15 (×2): 2 mg via INTRAVENOUS
  Filled 2012-11-15: qty 1

## 2012-11-15 MED ORDER — PROPOFOL INFUSION 10 MG/ML OPTIME
INTRAVENOUS | Status: DC | PRN
  Administered 2012-11-15: 50 ug/kg/min via INTRAVENOUS

## 2012-11-15 MED ORDER — POLYETHYLENE GLYCOL 3350 17 G PO PACK: 17.0000 g | PACK | Freq: Every day | ORAL | Status: DC | PRN

## 2012-11-15 MED ORDER — DEXAMETHASONE SODIUM PHOSPHATE 10 MG/ML IJ SOLN: 10.0000 mg | Freq: Once | INTRAMUSCULAR | Status: DC

## 2012-11-15 MED ORDER — BUPIVACAINE IN DEXTROSE 0.75-8.25 % IT SOLN
INTRATHECAL | Status: DC | PRN
  Administered 2012-11-15: 2 mL via INTRATHECAL

## 2012-11-15 MED ORDER — DOCUSATE SODIUM 100 MG PO CAPS
100.0000 mg | ORAL_CAPSULE | Freq: Two times a day (BID) | ORAL | Status: DC
  Administered 2012-11-15 – 2012-11-19 (×8): 100 mg via ORAL

## 2012-11-15 MED ORDER — CEFAZOLIN SODIUM-DEXTROSE 2-3 GM-% IV SOLR
2.0000 g | Freq: Four times a day (QID) | INTRAVENOUS | Status: AC
  Administered 2012-11-15 (×2): 2 g via INTRAVENOUS
  Filled 2012-11-15 (×2): qty 50

## 2012-11-15 MED ORDER — TRIAMTERENE-HCTZ 75-50 MG PO TABS
1.0000 | ORAL_TABLET | Freq: Every morning | ORAL | Status: DC
  Administered 2012-11-16 – 2012-11-19 (×4): 1 via ORAL
  Filled 2012-11-15 (×4): qty 1

## 2012-11-15 MED ORDER — INSULIN ASPART 100 UNIT/ML ~~LOC~~ SOLN
0.0000 [IU] | Freq: Three times a day (TID) | SUBCUTANEOUS | Status: DC
  Administered 2012-11-15 – 2012-11-19 (×7): 2 [IU] via SUBCUTANEOUS

## 2012-11-15 MED ORDER — TRAMADOL HCL 50 MG PO TABS: 50.0000 mg | ORAL_TABLET | Freq: Four times a day (QID) | ORAL | Status: DC | PRN

## 2012-11-15 MED ORDER — METHOCARBAMOL 500 MG PO TABS
500.0000 mg | ORAL_TABLET | Freq: Four times a day (QID) | ORAL | Status: DC | PRN
  Administered 2012-11-16 – 2012-11-19 (×9): 500 mg via ORAL
  Filled 2012-11-15 (×8): qty 1

## 2012-11-15 MED ORDER — SODIUM CHLORIDE 0.9 % IJ SOLN
INTRAMUSCULAR | Status: AC
  Filled 2012-11-15: qty 50

## 2012-11-15 MED ORDER — FLEET ENEMA 7-19 GM/118ML RE ENEM: 1.0000 | ENEMA | Freq: Once | RECTAL | Status: AC | PRN

## 2012-11-15 MED ORDER — METOCLOPRAMIDE HCL 5 MG/ML IJ SOLN: 5.0000 mg | Freq: Three times a day (TID) | INTRAMUSCULAR | Status: DC | PRN

## 2012-11-15 MED ORDER — BISACODYL 10 MG RE SUPP: 10.0000 mg | Freq: Every day | RECTAL | Status: DC | PRN

## 2012-11-15 MED ORDER — ONDANSETRON HCL 4 MG/2ML IJ SOLN: 4.0000 mg | Freq: Four times a day (QID) | INTRAMUSCULAR | Status: DC | PRN

## 2012-11-15 MED ORDER — OXYCODONE HCL 5 MG PO TABS
5.0000 mg | ORAL_TABLET | ORAL | Status: DC | PRN
  Administered 2012-11-15 – 2012-11-19 (×17): 10 mg via ORAL
  Filled 2012-11-15 (×16): qty 2

## 2012-11-15 MED ORDER — ACETAMINOPHEN 500 MG PO TABS
1000.0000 mg | ORAL_TABLET | Freq: Once | ORAL | Status: AC
  Administered 2012-11-15: 1000 mg via ORAL
  Filled 2012-11-15: qty 2

## 2012-11-15 MED ORDER — METOCLOPRAMIDE HCL 10 MG PO TABS: 5.0000 mg | ORAL_TABLET | Freq: Three times a day (TID) | ORAL | Status: DC | PRN

## 2012-11-15 MED ORDER — MIDAZOLAM HCL 5 MG/5ML IJ SOLN
INTRAMUSCULAR | Status: DC | PRN
  Administered 2012-11-15 (×2): 2 mg via INTRAVENOUS

## 2012-11-15 MED ORDER — ONDANSETRON HCL 4 MG PO TABS: 4.0000 mg | ORAL_TABLET | Freq: Four times a day (QID) | ORAL | Status: DC | PRN

## 2012-11-15 MED ORDER — FENTANYL CITRATE 0.05 MG/ML IJ SOLN
INTRAMUSCULAR | Status: DC | PRN
  Administered 2012-11-15: 100 ug via INTRAVENOUS
  Administered 2012-11-15 (×2): 50 ug via INTRAVENOUS

## 2012-11-15 MED ORDER — DIPHENHYDRAMINE HCL 12.5 MG/5ML PO ELIX: 12.5000 mg | ORAL_SOLUTION | ORAL | Status: DC | PRN

## 2012-11-15 MED ORDER — MORPHINE SULFATE 2 MG/ML IJ SOLN
INTRAMUSCULAR | Status: AC
  Administered 2012-11-15: 2 mg via INTRAVENOUS
  Filled 2012-11-15: qty 1

## 2012-11-15 MED ORDER — BUPIVACAINE HCL (PF) 0.25 % IJ SOLN
INTRAMUSCULAR | Status: AC
  Filled 2012-11-15: qty 30

## 2012-11-15 MED ORDER — HYDROMORPHONE HCL PF 1 MG/ML IJ SOLN
INTRAMUSCULAR | Status: AC
  Filled 2012-11-15: qty 1

## 2012-11-15 MED ORDER — ONDANSETRON HCL 4 MG/2ML IJ SOLN
INTRAMUSCULAR | Status: DC | PRN
  Administered 2012-11-15: 4 mg via INTRAMUSCULAR

## 2012-11-15 MED ORDER — METHOCARBAMOL 100 MG/ML IJ SOLN
500.0000 mg | Freq: Four times a day (QID) | INTRAMUSCULAR | Status: DC | PRN
  Administered 2012-11-15: 500 mg via INTRAVENOUS
  Filled 2012-11-15 (×2): qty 5

## 2012-11-15 MED ORDER — BUPIVACAINE HCL 0.25 % IJ SOLN
INTRAMUSCULAR | Status: DC | PRN
  Administered 2012-11-15: 20 mL

## 2012-11-15 MED ORDER — POTASSIUM CHLORIDE IN NACL 20-0.9 MEQ/L-% IV SOLN
INTRAVENOUS | Status: DC
  Administered 2012-11-15 – 2012-11-16 (×2): via INTRAVENOUS
  Filled 2012-11-15 (×4): qty 1000

## 2012-11-15 MED ORDER — ACETAMINOPHEN 500 MG PO TABS
1000.0000 mg | ORAL_TABLET | Freq: Four times a day (QID) | ORAL | Status: AC
  Administered 2012-11-15 – 2012-11-16 (×4): 1000 mg via ORAL
  Filled 2012-11-15 (×4): qty 2

## 2012-11-15 MED ORDER — SODIUM CHLORIDE 0.9 % IJ SOLN
INTRAMUSCULAR | Status: DC | PRN
  Administered 2012-11-15: 10:00:00

## 2012-11-15 MED ORDER — SODIUM CHLORIDE 0.9 % IV SOLN: INTRAVENOUS | Status: DC

## 2012-11-15 MED ORDER — PHENYLEPHRINE HCL 10 MG/ML IJ SOLN
10.0000 mg | INTRAVENOUS | Status: DC | PRN
  Administered 2012-11-15: 15 ug/min via INTRAVENOUS

## 2012-11-15 MED ORDER — HYDROMORPHONE HCL PF 1 MG/ML IJ SOLN
0.2500 mg | INTRAMUSCULAR | Status: DC | PRN
  Administered 2012-11-15: 0.25 mg via INTRAVENOUS
  Administered 2012-11-15: 0.5 mg via INTRAVENOUS

## 2012-11-15 MED ORDER — SODIUM CHLORIDE 0.9 % IR SOLN
Status: DC | PRN
  Administered 2012-11-15: 1000 mL

## 2012-11-15 MED ORDER — CEFAZOLIN SODIUM-DEXTROSE 2-3 GM-% IV SOLR
2.0000 g | INTRAVENOUS | Status: AC
  Administered 2012-11-15: 2 g via INTRAVENOUS

## 2012-11-15 MED ORDER — BUPIVACAINE LIPOSOME 1.3 % IJ SUSP
20.0000 mL | Freq: Once | INTRAMUSCULAR | Status: DC
  Filled 2012-11-15: qty 20

## 2012-11-15 MED ORDER — METFORMIN HCL 500 MG PO TABS
500.0000 mg | ORAL_TABLET | Freq: Every day | ORAL | Status: DC
  Administered 2012-11-16: 500 mg via ORAL
  Filled 2012-11-15 (×3): qty 1

## 2012-11-15 MED ORDER — PHENYLEPHRINE HCL 10 MG/ML IJ SOLN
INTRAMUSCULAR | Status: DC | PRN
  Administered 2012-11-15 (×2): 80 ug via INTRAVENOUS

## 2012-11-15 MED ORDER — PROMETHAZINE HCL 25 MG/ML IJ SOLN: 6.2500 mg | INTRAMUSCULAR | Status: DC | PRN

## 2012-11-15 SURGICAL SUPPLY — 54 items
BAG SPEC THK2 15X12 ZIP CLS (MISCELLANEOUS) ×1
BAG ZIPLOCK 12X15 (MISCELLANEOUS) ×2 IMPLANT
BIT DRILL 2.8X128 (BIT) ×2 IMPLANT
BLADE EXTENDED COATED 6.5IN (ELECTRODE) ×2 IMPLANT
BLADE SAW SAG 73X25 THK (BLADE) ×1
BLADE SAW SGTL 73X25 THK (BLADE) ×1 IMPLANT
CABLE (Orthopedic Implant) ×1 IMPLANT
CAPT HIP PF COP ×1 IMPLANT
CLOTH BEACON ORANGE TIMEOUT ST (SAFETY) ×1 IMPLANT
DECANTER SPIKE VIAL GLASS SM (MISCELLANEOUS) ×2 IMPLANT
DRAPE INCISE IOBAN 66X45 STRL (DRAPES) ×2 IMPLANT
DRAPE ORTHO SPLIT 77X108 STRL (DRAPES) ×4
DRAPE POUCH INSTRU U-SHP 10X18 (DRAPES) ×2 IMPLANT
DRAPE SURG ORHT 6 SPLT 77X108 (DRAPES) ×2 IMPLANT
DRAPE U-SHAPE 47X51 STRL (DRAPES) ×2 IMPLANT
DRSG ADAPTIC 3X8 NADH LF (GAUZE/BANDAGES/DRESSINGS) ×2 IMPLANT
DRSG MEPILEX BORDER 4X4 (GAUZE/BANDAGES/DRESSINGS) ×2 IMPLANT
DRSG MEPILEX BORDER 4X8 (GAUZE/BANDAGES/DRESSINGS) ×2 IMPLANT
DURAPREP 26ML APPLICATOR (WOUND CARE) ×2 IMPLANT
ELECT REM PT RETURN 9FT ADLT (ELECTROSURGICAL) ×2
ELECTRODE REM PT RTRN 9FT ADLT (ELECTROSURGICAL) ×1 IMPLANT
EVACUATOR 1/8 PVC DRAIN (DRAIN) ×2 IMPLANT
FACESHIELD LNG OPTICON STERILE (SAFETY) ×8 IMPLANT
GLOVE BIO SURGEON STRL SZ7.5 (GLOVE) ×2 IMPLANT
GLOVE BIO SURGEON STRL SZ8 (GLOVE) ×2 IMPLANT
GLOVE BIOGEL PI IND STRL 8 (GLOVE) ×3 IMPLANT
GLOVE BIOGEL PI INDICATOR 8 (GLOVE) ×3
GLOVE SURG SS PI 6.5 STRL IVOR (GLOVE) ×4 IMPLANT
GOWN PREVENTION PLUS LG XLONG (DISPOSABLE) ×4 IMPLANT
GOWN STRL REIN XL XLG (GOWN DISPOSABLE) ×2 IMPLANT
IMMOBILIZER KNEE 20 (SOFTGOODS)
IMMOBILIZER KNEE 20 THIGH 36 (SOFTGOODS) IMPLANT
KIT BASIN OR (CUSTOM PROCEDURE TRAY) ×2 IMPLANT
MANIFOLD NEPTUNE II (INSTRUMENTS) ×2 IMPLANT
NDL SAFETY ECLIPSE 18X1.5 (NEEDLE) ×1 IMPLANT
NEEDLE HYPO 18GX1.5 SHARP (NEEDLE) ×2
NEEDLE HYPO 22GX1.5 SAFETY (NEEDLE) ×2 IMPLANT
NS IRRIG 1000ML POUR BTL (IV SOLUTION) ×2 IMPLANT
PACK TOTAL JOINT (CUSTOM PROCEDURE TRAY) ×2 IMPLANT
PASSER SUT SWANSON 36MM LOOP (INSTRUMENTS) ×2 IMPLANT
POSITIONER SURGICAL ARM (MISCELLANEOUS) ×2 IMPLANT
SPONGE GAUZE 4X4 12PLY (GAUZE/BANDAGES/DRESSINGS) ×1 IMPLANT
STRIP CLOSURE SKIN 1/2X4 (GAUZE/BANDAGES/DRESSINGS) ×3 IMPLANT
SUT ETHIBOND NAB CT1 #1 30IN (SUTURE) ×4 IMPLANT
SUT MNCRL AB 4-0 PS2 18 (SUTURE) ×2 IMPLANT
SUT VIC AB 2-0 CT1 27 (SUTURE) ×6
SUT VIC AB 2-0 CT1 TAPERPNT 27 (SUTURE) ×3 IMPLANT
SUT VLOC 180 0 24IN GS25 (SUTURE) ×4 IMPLANT
SYR 20CC LL (SYRINGE) ×2 IMPLANT
SYR 50ML LL SCALE MARK (SYRINGE) ×2 IMPLANT
TOWEL OR 17X26 10 PK STRL BLUE (TOWEL DISPOSABLE) ×4 IMPLANT
TOWEL OR NON WOVEN STRL DISP B (DISPOSABLE) ×2 IMPLANT
TRAY FOLEY CATH 14FRSI W/METER (CATHETERS) ×2 IMPLANT
WATER STERILE IRR 1500ML POUR (IV SOLUTION) ×3 IMPLANT

## 2012-11-15 NOTE — Progress Notes (Signed)
Utilization review completed.  

## 2012-11-15 NOTE — Transfer of Care (Signed)
Immediate Anesthesia Transfer of Care Note  Patient: Tami Pearson  Procedure(s) Performed: Procedure(s): LEFT TOTAL HIP ARTHROPLASTY (Left)  Patient Location: PACU  Anesthesia Type:Spinal  Level of Consciousness: awake, alert , oriented and patient cooperative  Airway & Oxygen Therapy: Patient Spontanous Breathing and Patient connected to face mask oxygen  Post-op Assessment: Report given to PACU RN and Post -op Vital signs reviewed and stable  Post vital signs: stable  Complications: No apparent anesthesia complications

## 2012-11-15 NOTE — Op Note (Signed)
Pre-operative diagnosis- Osteoarthritis Left hip  Post-operative diagnosis- Osteoarthritis  Left hip  Procedure-  LeftTotal Hip Arthroplasty  Surgeon- Gus Rankin. Hattye Siegfried, MD  Assistant- Avel Peace, PA-C   Anesthesia  Spinal  EBL- 250 ml   Drain Hemovac   Complication- None  Condition-PACU - hemodynamically stable.   Brief Clinical Note-  Tami Pearson is a 47 y.o. female with end stage arthritis of her left hip with progressively worsening pain and dysfunction. Pain occurs with activity and rest including pain at night. She has tried analgesics, protected weight bearing and rest without benefit. Pain is too severe to attempt physical therapy. Radiographs demonstrate bone on bone arthritis with subchondral cyst formation. She presents now for left THA.  Procedure in detail-   The patient is brought into the operating room and placed on the operating table. After successful administration of Spinal  anesthesia, the patient is placed in the  Right lateral decubitus position with the  Left side up and held in place with the hip positioner. The lower extremity is isolated from the perineum with plastic drapes and time-out is performed by the surgical team. The lower extremity is then prepped and draped in the usual sterile fashion. A short posterolateral incision is made with a ten blade through the subcutaneous tissue to the level of the fascia lata which is incised in line with the skin incision. The sciatic nerve is palpated and protected and the short external rotators and capsule are isolated from the femur. The hip is then dislocated and the center of the femoral head is marked. A trial prosthesis is placed such that the trial head corresponds to the center of the patients' native femoral head. The resection level is marked on the femoral neck and the resection is made with an oscillating saw. The femoral head is removed and femoral retractors placed to gain access to the femoral  canal.      The canal finder is passed into the femoral canal and the canal is thoroughly irrigated with sterile saline to remove the fatty contents. Axial reaming is performed to 13.5  mm, proximal reaming to 18 B  and the sleeve machined to a small. A 18 B small trial sleeve is placed into the proximal femur.      The femur is then retracted anteriorly to gain acetabular exposure. Acetabular retractors are placed and the labrum and osteophytes are removed, Acetabular reaming is performed to 49   mm and a 50  mm Pinnacle acetabular shell is placed in anatomic position with excellent purchase. Additional dome screws were placed. An apex hole eliminator is placed and the permanent 32 mm neutral + 4 Marathon liner is placed into the acetabular shell.      The trial femur is then placed into the femoral canal. The size is 18 x 13  stem with a 36 + 8  neck and a 32 + 0 head with the neck version matching  the patients' native anteversion. The hip is reduced with excellent stability with full extension and full external rotation, 70 degrees flexion with 40 degrees adduction and 90 degrees internal rotation and 90 degrees of flexion with 70 degrees of internal rotation. The operative leg is placed on top of the non-operative leg and the leg lengths are found to be equal. The trials are then removed and the permanent implant of the same size is impacted into the femoral canal. It is noted that there is a small crack in the calcar which  is non-displaced and I elected to place a Zimmer cerclage cable around the femur just below the lesser trochanter to stabilize this and prevent propagation of the crack.The ceramic  femoral head of the same size as the trial is placed and the hip is reduced with the same stability parameters. The operative leg is again placed on top of the non-operative leg and the leg lengths are found to be equal.      The wound is then copiously irrigated with saline solution and the capsule and short  external rotators are re-attached to the femur through drill holes with Ethibond suture. The fascia lata is closed over a hemovac drain with #1 vicryl suture and the fascia lata, gluteal muscles and subcutaneous tissues are injected with Exparel 20ml diluted with saline 30 ml plus 20 ml of .25% Marcaine). The subcutaneous tissues are closed with #1 and2-0 vicryl and the subcuticular layer closed with running 4-0 Monocryl. The drain is hooked to suction, incision cleaned and dried, and steri-srips and a bulky sterile dressing applied. The limb is placed into a knee immobilizer and the patient is awakened and transported to recovery in stable condition.      Please note that a surgical assistant was a medical necessity for this procedure in order to perform it in a safe and expeditious manner. The assistant was necessary to provide retraction to the vital neurovascular structures and to retract and position the limb to allow for anatomic placement of the prosthetic components.  Gus Rankin Kashtyn Jankowski, MD    11/15/2012, 10:09 AM

## 2012-11-15 NOTE — Evaluation (Signed)
Physical Therapy Evaluation Patient Details Name: Tami Pearson MRN: [id number] DOB: January 05, 1966 Today's Date: 11/15/2012 Time: 0272-5366 PT Time Calculation (min): 23 min  PT Assessment / Plan / Recommendation History of Present Illness  s/p L posterior THA on 11/15/12  Clinical Impression  Pt expressed being very uncomfortable in bed , felt L knee needed to flexed. Pt assisted to edge of bed and stood. Returned to bed. Positioned pt on partial roll to R with pillows between knees for a rest. Pt will benefit from PT to address problems. Plans for HHPT.    PT Assessment  Patient needs continued PT services    Follow Up Recommendations  Home health PT;Supervision/Assistance - 24 hour    Does the patient have the potential to tolerate intense rehabilitation      Barriers to Discharge        Equipment Recommendations  Rolling walker with 5" wheels    Recommendations for Other Services     Frequency 7X/week    Precautions / Restrictions Precautions Precautions: Posterior Hip Precaution Comments: instructions on wall Restrictions Weight Bearing Restrictions: Yes LLE Weight Bearing: Weight bearing as tolerated   Pertinent Vitals/Pain L knee 9, L hip 9, decreased after moving in/out of bed.      Mobility  Bed Mobility Bed Mobility: Rolling Right;Supine to Sit;Sitting - Scoot to [place] of Bed;Sit to Supine Rolling Right: 1: +2 Total assist;With rail Rolling Right: Patient Percentage: 30% Supine to Sit: 1: +2 Total assist Supine to Sit: Patient Percentage: 30% Sitting - Scoot to Edge of Bed: 3: Mod assist Sit to Supine: 1: +2 Total assist Sit to Supine: Patient Percentage: 30% Details for Bed Mobility Assistance: multimodal cues for safety, posterior hip precautions, assistanc eto semi roll to R side and flex knee due to significant discomfort in KI and on her back. Pillows place between knees , family instructed  to call RN if leg slides down. Transfers Transfers: Sit  to Stand;Stand to Sit Sit to Stand: 1: +2 Total assist;From bed;With upper extremity assist Sit to Stand: Patient Percentage: 60% Stand to Sit: 1: +2 Total assist;To bed Stand to Sit: Patient Percentage: 60% Details for Transfer Assistance: verbal cues for UE placement adnLLE position prior to sitting down. Cues for precautions. Ambulation/Gait Ambulation/Gait Assistance: 1: +2 Total assist Ambulation/Gait: Patient Percentage: 60% Ambulation Distance (Feet): 4 Feet Assistive device: Rolling walker Ambulation/Gait Assistance Details: side steps along edge of bed. cues for sequence, posture. Gait Pattern: Step-to pattern;Decreased stance time - left;Decreased hip/knee flexion - left;Decreased step length - left    Exercises     PT Diagnosis: Difficulty walking;Acute pain  PT Problem List: Decreased strength;Decreased range of motion;Decreased activity tolerance;Decreased mobility;Decreased safety awareness;Decreased knowledge of precautions;Decreased knowledge of use of DME;Pain;Obesity PT Treatment Interventions: DME instruction;Gait training;Stair training;Functional mobility training;Therapeutic activities;Therapeutic exercise;Patient/family education     PT Goals(Current goals can be found in the care plan section) Acute Rehab PT Goals Patient Stated Goal: I want to get  rid of this knee pain, get my joints replaced. PT Goal Formulation: With patient/family Time For Goal Achievement: 11/22/12 Potential to Achieve Goals: Good  Visit Information  Last PT Received On: 11/15/12 Assistance Needed: +2 History of Present Illness: s/p L posterior THA on 11/15/12       Prior Functioning  Home Living Family/patient expects to be discharged to:: Private residence Living Arrangements: Parent;Other relatives Available Help at Discharge: Family;Available 24 hours/day Type of Home: House Home Access: Stairs to enter [employer] of Steps: 3  Entrance Stairs-Rails: None Home  Layout: One level Home Equipment: None Prior Function Level of Independence: Independent Communication Communication: No difficulties    Cognition  Cognition Arousal/Alertness: Awake/alert Behavior During Therapy: WFL for tasks assessed/performed Overall Cognitive Status: Within Functional Limits for tasks assessed    Extremity/Trunk Assessment Upper Extremity Assessment Upper Extremity Assessment: Overall WFL for tasks assessed Lower Extremity Assessment Lower Extremity Assessment: LLE deficits/detail LLE Deficits / Details: requires max assistance to lift or move leg.   Balance    End of Session PT - End of Session Activity Tolerance: Patient limited by pain Patient left: in bed;with call bell/phone within reach;with family/visitor present Nurse Communication: Mobility status  GP     Rada Hay 11/15/2012, 5:30 PM Blanchard Kelch PT [phone]

## 2012-11-15 NOTE — Anesthesia Postprocedure Evaluation (Signed)
  Anesthesia Post-op Note  Patient: Tami Pearson  Procedure(s) Performed: Procedure(s) (LRB): LEFT TOTAL HIP ARTHROPLASTY (Left)  Patient Location: PACU  Anesthesia Type: Spinal  Level of Consciousness: awake and alert   Airway and Oxygen Therapy: Patient Spontanous Breathing  Post-op Pain: mild  Post-op Assessment: Post-op Vital signs reviewed, Patient's Cardiovascular Status Stable, Respiratory Function Stable, Patent Airway and No signs of Nausea or vomiting  Last Vitals:  Filed Vitals:   11/15/12 1152  BP: 106/75  Pulse:   Temp:   Resp:     Post-op Vital Signs: stable   Complications: No apparent anesthesia complications

## 2012-11-15 NOTE — Anesthesia Preprocedure Evaluation (Addendum)
Anesthesia Evaluation  Patient identified by MRN, date of birth, ID band Patient awake    Reviewed: Allergy & Precautions, H&P , NPO status , Patient's Chart, lab work & pertinent test results  History of Anesthesia Complications (+) PONV  Airway Mallampati: III TM Distance: <3 FB Neck ROM: Full    Dental no notable dental hx.    Pulmonary neg pulmonary ROS,  breath sounds clear to auscultation  Pulmonary exam normal       Cardiovascular hypertension, Pt. on medications Rhythm:Regular Rate:Normal     Neuro/Psych negative neurological ROS  negative psych ROS   GI/Hepatic negative GI ROS, Neg liver ROS,   Endo/Other  negative endocrine ROSdiabetesMorbid obesity  Renal/GU negative Renal ROS  negative genitourinary   Musculoskeletal negative musculoskeletal ROS (+)   Abdominal   Peds negative pediatric ROS (+)  Hematology negative hematology ROS (+)   Anesthesia Other Findings   Reproductive/Obstetrics negative OB ROS                           Anesthesia Physical Anesthesia Plan  ASA: III  Anesthesia Plan: Spinal   Post-op Pain Management:    Induction: Intravenous  Airway Management Planned: Simple Face Mask  Additional Equipment:   Intra-op Plan:   Post-operative Plan:   Informed Consent: I have reviewed the patients History and Physical, chart, labs and discussed the procedure including the risks, benefits and alternatives for the proposed anesthesia with the patient or authorized representative who has indicated his/her understanding and acceptance.   Dental advisory given  Plan Discussed with: CRNA and Surgeon  Anesthesia Plan Comments:        Anesthesia Quick Evaluation

## 2012-11-15 NOTE — Progress Notes (Signed)
PACU NOTE:  NEO gtt stopped at 1130.  Total fluids in PACU . Unable to chart in EPIC.

## 2012-11-15 NOTE — H&P (View-Only) (Signed)
Tami Pearson  DOB: 07-11-1965 Married / Language: English / Race: Black or African American Female  Date of Admission:  11/15/2012  Chief Complaint: Left Hip Pain  History of Present Illness The patient is a 47 year old female who comes in for a preoperative History and Physical. The patient is scheduled for a left total hip arthroplasty to be performed by Dr. Gus Pearson. Tami Pearson at [facility] on 11/15/2012. The patient is a 47 year old female who presents with a hip problem. The patient is seen in referral from Dr Tami Pearson in [place].The patient reports left hip and right hip problems including pain, giving way (left), catching and stiffness symptoms that have been present for year(s). The symptoms began without any known injury. Symptoms reported include hip pain, pain with weightbearing, difficulty flexing hip, difficulty rotating hip and difficulty bearing weightThe symptoms are described as severe.The patient feels as if their symptoms are does feel they are worsening. Symptoms are exacerbated by movement, flexing hip and weight bearing. Symptoms are not relieved by non-opioid analgesics (Tylenol). Prior to being seen today the patient was previously evaluated by an out of town physician (Dr Tami Pearson). Previous workup for this problem has included hip x-rays (Dr Tami Pearson office). Previous treatment for this problem has included oral corticosteroids. [name] comes in for evaluation of both hips. They have been hurting for years but was progressivley gotten worse with time. She ahs been seen and treated and thought the pain was initially coming form her back but through workup and evaluation, it was determined that the pain has been coming from her hips. She has been treated with medications including analgesics and oral steroid medications without benefit. She has also undergone intraarticular cortisone injections into both hips. She states the injections helped  only for a couple of days and then the pain came right back. She was referred over to Dr. Lequita Pearson by Dr. Eduard Pearson and is her to get her hips evaulated and discuss surgery. 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 She has not missed work but the pain has interferred with her walking and house work. The left hip is the worse of the two and now it has started to give out on her. She can't walk or even stand for any length of time. She has night pain, trouble getting in and out of the car, more difficulty with shoes and socks, and bending. She is ready to get the hip fixed at this time.  They have been treated conservatively in the past for the above stated problem and despite conservative measures, they continue to have progressive pain and severe functional limitations and dysfunction. They have failed non-operative management including home exercise, medications. It is felt that they would benefit from undergoing total joint replacement. Risks and benefits of the procedure have been discussed with the patient and they elect to proceed with surgery. There are no active contraindications to surgery such as ongoing infection or rapidly progressive neurological disease.   Problem List Osteoarthritis, Hip (715.35)   Allergies Ibuprofen *ANALGESICS - ANTI-INFLAMMATORY*. Vomiting.   Family History Hypertension. mother Osteoarthritis. mother Diabetes Mellitus. mother   Social History Marital status. married Number of flights of stairs before winded. 1 Living situation. live with parents Exercise. Exercises rarely; does running / walking Illicit drug use. no Pain Contract. no Tobacco / smoke exposure. no Tobacco use. never smoker Drug/Alcohol Rehab (Currently). no Drug/Alcohol Rehab (Previously). no Current work status. working full time Alcohol use.  never consumed alcohol   Medication History MetFORMIN HCl (500MG  Tablet, Oral)  Active. Hydrocodone-Acetaminophen (10-325MG  Tablet, Oral) Active. Triamterene-HCTZ (75-50MG  Tablet, Oral) Active.   Past Surgical History Ankle Surgery. Date: 32. right Tubal Ligation. Date: 53. Hardware Removal Right Ankle   Medical History High blood pressure Osteoarthritis Diet-Controlled Diabetes Mellitus   Review of Systems General:Not Present- Chills, Fever, Night Sweats, Fatigue, Weight Gain, Weight Loss and Memory Loss. Skin:Not Present- Hives, Itching, Rash, Eczema and Lesions. HEENT:Not Present- Tinnitus, Headache, Double Vision, Visual Loss, Hearing Loss and Dentures. Respiratory:Not Present- Shortness of breath with exertion, Shortness of breath at rest, Allergies, Coughing up blood and Chronic Cough. Cardiovascular:Not Present- Chest Pain, Racing/skipping heartbeats, Difficulty Breathing Lying Down, Murmur, Swelling and Palpitations. Gastrointestinal:Not Present- Bloody Stool, Heartburn, Abdominal Pain, Vomiting, Nausea, Constipation, Diarrhea, Difficulty Swallowing, Jaundice and Loss of appetitie. Female Genitourinary:Not Present- Blood in Urine, Urinary frequency, Weak urinary stream, Discharge, Flank Pain, Incontinence, Painful Urination, Urgency, Urinary Retention and Urinating at Night. Musculoskeletal:Present- Joint Pain. Not Present- Muscle Weakness, Muscle Pain, Joint Swelling, Back Pain, Morning Stiffness and Spasms. Neurological:Not Present- Tremor, Dizziness, Blackout spells, Paralysis, Difficulty with balance and Weakness. Psychiatric:Not Present- Insomnia.   Vitals Pulse: 72 (Regular) Resp.: 16 (Unlabored) BP: 138/92 (Sitting, Right Arm, Standard)    Physical Exam The physical exam findings are as follows:   General Mental Status - Alert, cooperative and good historian. General Appearance- pleasant. Not in acute distress. Orientation- Oriented X3. Build & Nutrition- Overweight, Obese and Well developed.   Head  and Neck Head- normocephalic, atraumatic . Neck Global Assessment- supple. no bruit auscultated on the right and no bruit auscultated on the left.   Eye Pupil- Bilateral- Regular and Round. Motion- Bilateral- EOMI.   Chest and Lung Exam Auscultation: Breath sounds:- clear at anterior chest wall and - clear at posterior chest wall. Adventitious sounds:- No Adventitious sounds.   Cardiovascular Auscultation:Rhythm- Regular rate and rhythm. Heart Sounds- S1 WNL and S2 WNL. Murmurs & Other Heart Sounds:Auscultation of the heart reveals - No Murmurs.   Abdomen Palpation/Percussion:Tenderness- Abdomen is non-tender to palpation. Rigidity (guarding)- Abdomen is soft. Auscultation:Auscultation of the abdomen reveals - Bowel sounds normal.   Female Genitourinary  Not done, not pertinent to present illness  Musculoskeletal Well developed female alert and oriented in no apparent distress. Evaluation of her left hip flexion 90, no internal or external rotation, only about 5 degrees of abduction. Right hip flexion 110, rotation in 10, out 20, abduction 20 with discomfort. She does not have any trochanteric tenderness on either side. Bilateral knee exams are unremarkable except for slight anterior tenderness on the left knee. There is no effusion in either knee. There is no instability noted. Pulses, sensation and motor are intact.  RADIOGRAPHS: AP pelvis, lateral of both hips show severe endstage arthritis in both hips, left being worse than the right. She has severe bone on bone changes with bony erosion of the femoral head. She has large osteophytes present. The femoral head morphology still intact in the right hip but there is bone on bone change with osteophyte formation.  Assessment & Plan Osteoarthritis, Hip (715.35) Impression: Left Hip Current Plans l Pt Education - How to access health information online: discussed with patient and provided  information.  Note: Plan is for a Left Total Hip Replacement by Dr. Lequita Pearson.  Plan is to go home with family.  PCP - Dr. Syliva Overman  The patient does not have any contraindications and will receive TXA (tranexamic acid) prior to surgery.  Signed electronically by Lauraine Rinne, III PA-C

## 2012-11-15 NOTE — Anesthesia Procedure Notes (Signed)
Spinal  Patient location during procedure: OR Staffing Performed by: anesthesiologist  Preanesthetic Checklist Completed: patient identified, site marked, surgical consent, pre-op evaluation, timeout performed, IV checked, risks and benefits discussed and monitors and equipment checked Spinal Block Patient position: sitting Prep: Betadine Patient monitoring: heart rate, continuous pulse ox and blood pressure Injection technique: single-shot Needle Needle type: Sprotte  Needle gauge: 24 G Needle length: 10 cm Additional Notes Expiration date of kit checked and confirmed. Patient tolerated procedure well, without complications.

## 2012-11-15 NOTE — Interval H&P Note (Signed)
History and Physical Interval Note:  11/15/2012 8:17 AM  Tami Pearson  has presented today for surgery, with the diagnosis of OA OF LEFT HIP  The various methods of treatment have been discussed with the patient and family. After consideration of risks, benefits and other options for treatment, the patient has consented to  Procedure(s): LEFT TOTAL HIP ARTHROPLASTY (Left) as a surgical intervention .  The patient's history has been reviewed, patient examined, no change in status, stable for surgery.  I have reviewed the patient's chart and labs.  Questions were answered to the patient's satisfaction.     Loanne Drilling

## 2012-11-16 ENCOUNTER — Encounter (HOSPITAL_COMMUNITY): Payer: Self-pay | Admitting: Orthopedic Surgery

## 2012-11-16 LAB — GLUCOSE, CAPILLARY
Glucose-Capillary: 119 mg/dL — ABNORMAL HIGH (ref 70–99)
Glucose-Capillary: 117 mg/dL — ABNORMAL HIGH (ref 70–99)
Glucose-Capillary: 157 mg/dL — ABNORMAL HIGH (ref 70–99)

## 2012-11-16 LAB — CBC
Platelets: 295 10*3/uL (ref 150–400)
RDW: 13.3 % (ref 11.5–15.5)
WBC: 9.6 10*3/uL (ref 4.0–10.5)

## 2012-11-16 LAB — BASIC METABOLIC PANEL
Chloride: 105 mEq/L (ref 96–112)
GFR calc Af Amer: 61 mL/min — ABNORMAL LOW (ref >90)
Potassium: 3.5 mEq/L (ref 3.5–5.1)
Sodium: 138 mEq/L (ref 135–145)

## 2012-11-16 NOTE — Progress Notes (Signed)
   Subjective: 1 Day Post-Op Procedure(s) (LRB): LEFT TOTAL HIP ARTHROPLASTY (Left) Patient reports pain as mild and moderate.   Patient seen in rounds with Dr. Lequita Halt. Patient is well, and has had no acute complaints or problems We will start therapy today.  Plan is to go Home after hospital stay.  Objective: Vital signs in last 24 hours: Temp:  [97 F (36.1 C)-98.9 F (37.2 C)] 98.5 F (36.9 C) (10/16 0845) Pulse Rate:  [51-97] 97 (10/16 0845) Resp:  [13-24] 20 (10/16 0845) BP: (85-145)/(52-96) 126/80 mmHg (10/16 0845) SpO2:  [94 %-100 %] 98 % (10/16 0845) Weight:  [107.956 kg (238 lb)] 107.956 kg (238 lb) (10/15 1319)  Intake/Output from previous day:  Intake/Output Summary (Last 24 hours) at 11/16/12 0858 Last data filed at 11/16/12 0600  Gross per 24 hour  Intake 4918.75 ml  Output   2765 ml  Net 2153.75 ml    Labs:  Recent Labs  11/16/12 0526  HGB 9.9*    Recent Labs  11/16/12 0526  WBC 9.6  RBC 3.41*  HCT 30.4*  PLT 295    Recent Labs  11/16/12 0526  NA 138  K 3.5  CL 105  CO2 25  BUN 10  CREATININE 1.20*  GLUCOSE 127*  CALCIUM 8.6   No results found for this basename: LABPT, INR,  in the last 72 hours  EXAM General - Patient is Alert, Appropriate and Oriented Extremity - Neurovascular intact Sensation intact distally Dorsiflexion/Plantar flexion intact Dressing - dressing C/D/I Motor Function - intact, moving foot and toes well on exam.  Hemovac pulled without difficulty.  Past Medical History  Diagnosis Date  . Hypertension   . Arthritis 2010 approx    bilateral hip and knee pain  . Diabetes mellitus without complication     Assessment/Plan: 1 Day Post-Op Procedure(s) (LRB): LEFT TOTAL HIP ARTHROPLASTY (Left) Principal Problem:   OA (osteoarthritis) of hip  Estimated body mass index is 42.17 kg/(m^2) as calculated from the following:   Height as of this encounter: 5\' 3"  (1.6 m).   Weight as of this encounter: 107.956 kg  (238 lb). Advance diet Up with therapy Plan for discharge tomorrow Discharge home with home health  DVT Prophylaxis - Xarelto Weight Bearing As Tolerated left Leg D/C Knee Immobilizer Hemovac Pulled Begin Therapy Hip Preacutions  Mahiya Kercheval 11/16/2012, 8:58 AM

## 2012-11-16 NOTE — Progress Notes (Signed)
Physical Therapy Treatment Patient Details Name: SAMARA STANKOWSKI MRN: [id number] DOB: 10-14-1965 Today's Date: 11/16/2012 Time: 9562-1308 PT Time Calculation (min): 21 min  PT Assessment / Plan / Recommendation  History of Present Illness s/p L posterior THA on 11/15/12   PT Comments   POD # 1 am session.  Pt required MAX encouragement to participate.  Waiting till late am to see her.  Assisted OOB to amb to BR to void then amb to recliner.  Pt wanted to get back into be however I encouraged her to go to the recliner discussing benefits.  Pt's mother present during session.    Follow Up Recommendations  Home health PT;Supervision/Assistance - 24 hour     Does the patient have the potential to tolerate intense rehabilitation     Barriers to Discharge        Equipment Recommendations  Rolling walker with 5" wheels    Recommendations for Other Services    Frequency 7X/week   Progress towards PT Goals Progress towards PT goals: Progressing toward goals  Plan      Precautions / Restrictions Precautions Precautions: Posterior Hip Precaution Comments: instructions on wall Restrictions Weight Bearing Restrictions: No LLE Weight Bearing: Weight bearing as tolerated    Pertinent Vitals/Pain C/o 8/10 pain Pre medicated    Mobility  Bed Mobility Bed Mobility: Rolling Left;Supine to Sit Rolling Left: 4: Min assist Sitting - Scoot to Edge of Bed: 4: Min assist Details for Bed Mobility Assistance: pt required increased time due to pain and BMI Transfers Transfers: Sit to Stand;Stand to Sit Sit to Stand: 4: Min assist;From bed;From toilet Stand to Sit: 4: Min assist;To toilet;To chair/3-in-1 Details for Transfer Assistance: 25% VC's on safety with turns and increased time.   Ambulation/Gait Ambulation/Gait Assistance: 4: Min guard Ambulation Distance (Feet): 24 Feet Assistive device: Rolling walker Ambulation/Gait Assistance Details: Pt amb from bed to BR then to recliner  with increased time and 25% VC's safety with turns. Gait Pattern: Step-to pattern;Decreased stance time - left;Decreased hip/knee flexion - left;Decreased step length - left Gait velocity: decreased    ` PT Goals (current goals can now be found in the care plan section)    Visit Information  Last PT Received On: 11/16/12 Assistance Needed: +1 History of Present Illness: s/p L posterior THA on 11/15/12    Subjective Data      Cognition       Balance     End of Session PT - End of Session Equipment Utilized During Treatment: Gait belt Activity Tolerance: Patient limited by fatigue Patient left: in chair;with call bell/phone within reach;with family/visitor present   Felecia Shelling  PTA [facility]  Acute  Rehab Pager      [phone]

## 2012-11-16 NOTE — Evaluation (Signed)
Occupational Therapy Evaluation Patient Details Name: Tami Pearson MRN: [id number] DOB: November 10, 1965 Today's Date: 11/16/2012 Time: 4098-1191 OT Time Calculation (min): 7 min  OT Assessment / Plan / Recommendation History of present illness s/p L posterior THA on 11/15/12   Clinical Impression   Pt seen only for education.  Mother will assist her at home, and she and pt verbalize understanding of THPs.      OT Assessment       Follow Up Recommendations  No OT follow up    Barriers to Discharge      Equipment Recommendations  None recommended by OT (pt has a 3:1 commode)   Recommendations for Other Services    Frequency       Precautions / Restrictions Precautions Precautions: Posterior Hip Precaution Comments: instructions on wall Restrictions Weight Bearing Restrictions: No LLE Weight Bearing: Weight bearing as tolerated   Pertinent Vitals/Pain Pt has soreness in L hip; c/o being tired and not sleeping well.      ADL  ADL Comments: Pt/mother seen only for education.  Mother will help pt with all adls.   They are not interested in AE.  Pt has a 3:1 commode at home.  She will sponge bathe until she can step into tub:  not interested in tub bench.    Reviewed precautions, sequence for sit to stand, and demonstrated stepping into tub when ready.  Pt can practice with HHPT when she tolerates and is ready for this.  Pt/mother verbalize understanding of precautions with adls/transfers    OT Diagnosis:    OT Problem List:   OT Treatment Interventions:     OT Goals(Current goals can be found in the care plan section)    Visit Information  Last OT Received On: 11/16/12 Assistance Needed: +1 History of Present Illness: s/p L posterior THA on 11/15/12       Prior Functioning     Home Living Family/patient expects to be discharged to:: Private residence Living Arrangements: Parent;Other relatives Home Equipment: Bedside commode Prior Function Level of  Independence: Independent Communication Communication: No difficulties         Vision/Perception     Cognition  Cognition Arousal/Alertness: Awake/alert Behavior During Therapy: WFL for tasks assessed/performed Overall Cognitive Status: Within Functional Limits for tasks assessed    Extremity/Trunk Assessment Upper Extremity Assessment Upper Extremity Assessment: Overall WFL for tasks assessed     Mobility Bed Mobility Bed Mobility: Rolling Left;Supine to Sit Rolling Left: 4: Min assist Sitting - Scoot to Edge of Bed: 4: Min assist Details for Bed Mobility Assistance: pt required increased time due to pain and BMI Transfers Sit to Stand: 4: Min assist;From bed;From toilet Stand to Sit: 4: Min assist;To toilet;To chair/3-in-1 Details for Transfer Assistance: 25% VC's on safety with turns and increased time.       Exercise     Balance     End of Session OT - End of Session Activity Tolerance: Patient limited by fatigue  GO     Tami Pearson 11/16/2012, 2:36 PM Tami Pearson, OTR/L [phone] 11/16/2012

## 2012-11-16 NOTE — Progress Notes (Signed)
Physical Therapy Treatment Patient Details Name: Tami Pearson MRN: [id number] DOB: 09-12-1965 Today's Date: 11/16/2012 Time: 4098-1191 PT Time Calculation (min): 18 min  PT Assessment / Plan / Recommendation  History of Present Illness s/p L posterior THA on 11/15/12   PT Comments   POD # 1 pm session.  Amb pt in hallway then assisted back to bed.  Pt required increased time for mobility due to pain and BMI.   Follow Up Recommendations  Home health PT;Supervision/Assistance - 24 hour     Does the patient have the potential to tolerate intense rehabilitation     Barriers to Discharge        Equipment Recommendations  Rolling walker with 5" wheels    Recommendations for Other Services    Frequency 7X/week   Progress towards PT Goals Progress towards PT goals: Progressing toward goals  Plan      Precautions / Restrictions Precautions Precautions: Posterior Hip Precaution Comments: instructions on wall Restrictions Weight Bearing Restrictions: No LLE Weight Bearing: Weight bearing as tolerated    Pertinent Vitals/Pain C/o 8/10 repositioned    Mobility  Bed Mobility Bed Mobility: Sit to Supine Rolling Left: 4: Min assist Sitting - Scoot to Edge of Bed: 4: Min assist Sit to Supine: 2: Max assist Details for Bed Mobility Assistance: pt required increased time to get back into bed Transfers Transfers: Sit to Stand;Stand to Sit Sit to Stand: 4: Min guard;4: Min assist;From chair/3-in-1 Stand to Sit: 4: Min guard;4: Min assist;To bed Details for Transfer Assistance: 25% VC's on safety with turns and increased time.   Ambulation/Gait Ambulation/Gait Assistance: 4: Min guard Ambulation Distance (Feet): 45 Feet Assistive device: Rolling walker Ambulation/Gait Assistance Details: amb from hallway back to bed with increased time.  Difficulty weight shifting and advancing L LE.  Gait Pattern: Step-to pattern;Decreased stance time - left;Decreased hip/knee flexion -  left;Decreased step length - left Gait velocity: decreased      PT Goals (current goals can now be found in the care plan section)    Visit Information  Last PT Received On: 11/16/12 Assistance Needed: +1 History of Present Illness: s/p L posterior THA on 11/15/12    Subjective Data      Cognition  Cognition Arousal/Alertness: Awake/alert Behavior During Therapy: [facility] for tasks assessed/performed Overall Cognitive Status: Within Functional Limits for tasks assessed    Balance     End of Session PT - End of Session Equipment Utilized During Treatment: Gait belt Activity Tolerance: Patient limited by fatigue Patient left: in bed;with call bell/phone within reach Nurse Communication: Mobility status   Felecia Shelling  PTA [facility]  Acute  Rehab Pager      [phone]

## 2012-11-16 NOTE — Care Management Note (Signed)
  Page 1 of 1   11/16/2012     1:31:30 PM   CARE MANAGEMENT NOTE 11/16/2012  Patient:  Tami Pearson, Tami Pearson   Account Number:  [id number]  Date Initiated:  11/16/2012  Documentation initiated by:  Colleen Can  Subjective/Objective Assessment:   dx total hip replacemnt-left; anterior approach     Action/Plan:   CM spoke with patient. Plans are for her to go to her mother-in-laws home when discharged. Spouse and mother-in-law will provide care, She already has RW, shower chair and raised toilet seat.   Anticipated DC Date:  11/18/2012   Anticipated DC Plan:  HOME W HOME HEALTH SERVICES      DC Planning Services  CM consult      [facility] Choice  HOME HEALTH   Choice offered to / List presented to:  C-1 Patient        HH arranged  HH-2 PT      [facility] agency  [facility]   Status of service:  In process, will continue to follow Medicare Important Message given?   (If response is "NO", the following Medicare IM given date fields will be blank) Date Medicare IM given:   Date Additional Medicare IM given:    Discharge Disposition:    Per UR Regulation:  Reviewed for med. necessity/level of care/duration of stay  If discussed at Long Length of Stay Meetings, dates discussed:    Comments:

## 2012-11-17 DIAGNOSIS — E871 Hypo-osmolality and hyponatremia: Secondary | ICD-10-CM

## 2012-11-17 DIAGNOSIS — E876 Hypokalemia: Secondary | ICD-10-CM

## 2012-11-17 DIAGNOSIS — D62 Acute posthemorrhagic anemia: Secondary | ICD-10-CM

## 2012-11-17 LAB — BASIC METABOLIC PANEL
CO2: 23 mEq/L (ref 19–32)
Calcium: 8.6 mg/dL (ref 8.4–10.5)
Creatinine, Ser: 1.47 mg/dL — ABNORMAL HIGH (ref 0.50–1.10)
GFR calc non Af Amer: 41 mL/min — ABNORMAL LOW (ref >90)
Glucose, Bld: 135 mg/dL — ABNORMAL HIGH (ref 70–99)
Sodium: 134 mEq/L — ABNORMAL LOW (ref 135–145)

## 2012-11-17 LAB — CBC
Hemoglobin: 10.3 g/dL — ABNORMAL LOW (ref 12.0–15.0)
MCH: 29.3 pg (ref 26.0–34.0)
MCHC: 32.9 g/dL (ref 30.0–36.0)
Platelets: 293 10*3/uL (ref 150–400)

## 2012-11-17 LAB — GLUCOSE, CAPILLARY
Glucose-Capillary: 108 mg/dL — ABNORMAL HIGH (ref 70–99)
Glucose-Capillary: 113 mg/dL — ABNORMAL HIGH (ref 70–99)

## 2012-11-17 MED ORDER — METHOCARBAMOL 500 MG PO TABS: 500.0000 mg | ORAL_TABLET | Freq: Four times a day (QID) | ORAL | Status: DC | PRN

## 2012-11-17 MED ORDER — RIVAROXABAN 10 MG PO TABS: 10.0000 mg | ORAL_TABLET | Freq: Every day | ORAL | Status: DC

## 2012-11-17 MED ORDER — POTASSIUM CHLORIDE CRYS ER 20 MEQ PO TBCR
40.0000 meq | EXTENDED_RELEASE_TABLET | Freq: Every day | ORAL | Status: DC
  Administered 2012-11-17 – 2012-11-19 (×3): 40 meq via ORAL
  Filled 2012-11-17 (×3): qty 2

## 2012-11-17 MED ORDER — OXYCODONE HCL 5 MG PO TABS: 5.0000 mg | ORAL_TABLET | ORAL | Status: DC | PRN

## 2012-11-17 MED ORDER — TRAMADOL HCL 50 MG PO TABS: 50.0000 mg | ORAL_TABLET | Freq: Four times a day (QID) | ORAL | Status: DC | PRN

## 2012-11-17 NOTE — Progress Notes (Signed)
   Subjective: 2 Days Post-Op Procedure(s) (LRB): LEFT TOTAL HIP ARTHROPLASTY (Left) Patient reports pain as mild.   Patient seen in rounds with Dr. Lequita Halt. Family in room. Patient is well, but has had some minor complaints of pain in the hip and thigh, requiring pain medications.  Still hurting today. Plan is to go Home after hospital stay.  Objective: Vital signs in last 24 hours: Temp:  [98.3 F (36.8 C)-100 F (37.8 C)] 100 F (37.8 C) (10/17 0515) Pulse Rate:  [100-110] 109 (10/17 0515) Resp:  [16-20] 20 (10/17 0515) BP: (100-110)/(66-78) 100/67 mmHg (10/17 0515) SpO2:  [96 %-99 %] 96 % (10/17 0515)  Intake/Output from previous day:  Intake/Output Summary (Last 24 hours) at 11/17/12 0903 Last data filed at 11/16/12 1244  Gross per 24 hour  Intake    240 ml  Output      0 ml  Net    240 ml    Labs:  Recent Labs  11/16/12 0526 11/17/12 0405  HGB 9.9* 10.3*    Recent Labs  11/16/12 0526 11/17/12 0405  WBC 9.6 15.5*  RBC 3.41* 3.51*  HCT 30.4* 31.3*  PLT 295 293    Recent Labs  11/16/12 0526 11/17/12 0405  NA 138 134*  K 3.5 3.3*  CL 105 100  CO2 25 23  BUN 10 9  CREATININE 1.20* 1.47*  GLUCOSE 127* 135*  CALCIUM 8.6 8.6   No results found for this basename: LABPT, INR,  in the last 72 hours  EXAM General - Patient is Alert and Appropriate Extremity - Neurovascular intact Sensation intact distally Dorsiflexion/Plantar flexion intact No cellulitis present Dressing/Incision - clean, dry, no drainage, healing Motor Function - intact, moving foot and toes well on exam.   Past Medical History  Diagnosis Date  . Hypertension   . Arthritis 2010 approx    bilateral hip and knee pain  . Diabetes mellitus without complication     Assessment/Plan: 2 Days Post-Op Procedure(s) (LRB): LEFT TOTAL HIP ARTHROPLASTY (Left) Principal Problem:   OA (osteoarthritis) of hip Active Problems:   Postoperative anemia due to acute blood loss  Hyponatremia   Hypokalemia  Estimated body mass index is 42.17 kg/(m^2) as calculated from the following:   Height as of this encounter: 5\' 3"  (1.6 m).   Weight as of this encounter: 107.956 kg (238 lb). Up with therapy Discharge home with home health either later this afternoon if meets all goals and doing well or maybe tomorrow.  DVT Prophylaxis - Xarelto Weight Bearing As Tolerated left Leg  PERKINS, ALEXZANDREW 11/17/2012, 9:03 AM

## 2012-11-17 NOTE — Progress Notes (Signed)
Physical Therapy Treatment Patient Details Name: Tami Pearson MRN: [id number] DOB: [birth year] Today's Date: 11/17/2012 Time: 6962-9528 PT Time Calculation (min): 25 min  PT Assessment / Plan / Recommendation  History of Present Illness s/p L posterior THA on 11/15/12   PT Comments   POD # 2 am session pt requested for me to come back later after lunch.  PM session assisted pt OOB to amb in hallway then attempted to perform stairs however unsuccessful.  Pt unable to weight shift enough and step up with R LE.   Pt did NOT meet goals for D/C to day   Follow Up Recommendations  Home health PT;Supervision/Assistance - 24 hour     Does the patient have the potential to tolerate intense rehabilitation     Barriers to Discharge        Equipment Recommendations  Rolling walker with 5" wheels    Recommendations for Other Services    Frequency     Progress towards PT Goals Progress towards PT goals: Progressing toward goals  Plan      Precautions / Restrictions Precautions Precautions: Posterior Hip Precaution Comments: instructions on wall Restrictions Weight Bearing Restrictions: No LLE Weight Bearing: Weight bearing as tolerated    Pertinent Vitals/Pain C/o nausea with exersion 8/10 hip and back pain    Mobility  Bed Mobility Bed Mobility: Supine to Sit;Sit to Supine Supine to Sit: 2: Max assist Sit to Supine: 2: Max assist Details for Bed Mobility Assistance: pt required increased time to complete mvts due to pain and BMI Transfers Transfers: Sit to Stand;Stand to Sit Sit to Stand: 4: Min guard;4: Min assist;From bed Stand to Sit: 4: Min guard;4: Min assist;To bed Details for Transfer Assistance: 25% VC's on safety with turns and increased time.   Ambulation/Gait Ambulation/Gait Assistance: 4: Min guard Ambulation Distance (Feet): 18 Feet Assistive device: Rolling walker Ambulation/Gait Assistance Details: increased time and difficulty advancing L LE.  Pt  repeats, "I just can't do it".  Pt progressing slowly with mobility due to pain and BMI. Gait Pattern: Step-to pattern;Decreased stance time - left;Decreased hip/knee flexion - left;Decreased step length - left Gait velocity: decreased    PT Goals (current goals can now be found in the care plan section)    Visit Information  Last PT Received On: 11/17/12 Assistance Needed: +1 History of Present Illness: s/p L posterior THA on 11/15/12    Subjective Data      Cognition       Balance     End of Session PT - End of Session Equipment Utilized During Treatment: Gait belt Activity Tolerance: Patient limited by fatigue;Patient limited by pain Patient left: in bed;with call bell/phone within reach   Felecia Shelling  PTA [facility]  Acute  Rehab Pager      [phone]

## 2012-11-17 NOTE — Progress Notes (Signed)
PHARMACIST - PHYSICIAN COMMUNICATION DR:  Lequita Halt CONCERNING:  METFORMIN SAFE ADMINISTRATION POLICY  RECOMMENDATION: Metformin has been placed on DISCONTINUE (rejected order) STATUS and should be reordered only after any of the conditions below are ruled out.  Current safety recommendations include avoiding metformin for a minimum of 48 hours after the patient's exposure to intravenous contrast media.  DESCRIPTION:  The Pharmacy Committee has adopted a policy that restricts the use of metformin in hospitalized patients until all the contraindications to administration have been ruled out. Specific contraindications are: []  Serum creatinine ? 1.5 for males [x]  Serum creatinine ? 1.4 for females []  Shock, acute MI, sepsis, hypoxemia, dehydration []  Planned administration of intravenous iodinated contrast media []  Heart Failure patients with low EF []  Acute or chronic metabolic acidosis (including DKA)      Loralee Pacas, PharmD, BCPS 11/17/2012 7:29 AM

## 2012-11-17 NOTE — Discharge Summary (Signed)
Physician Discharge Summary   Patient ID: Tami Pearson MRN: [id number] DOB/AGE: [birth year] 47 y.o.  Admit date: 11/15/2012 Discharge date: 11/19/2012  Primary Diagnosis:  Osteoarthritis Left hip  Admission Diagnoses:  Past Medical History  Diagnosis Date  . Hypertension   . Arthritis 2010 approx    bilateral hip and knee pain  . Diabetes mellitus without complication    Discharge Diagnoses:   Principal Problem:   OA (osteoarthritis) of hip Active Problems:   Postoperative anemia due to acute blood loss   Hyponatremia   Hypokalemia  Estimated body mass index is 42.17 kg/(m^2) as calculated from the following:   Height as of this encounter: 5\' 3"  (1.6 m).   Weight as of this encounter: 107.956 kg (238 lb).  Procedure(s) (LRB): LEFT TOTAL HIP ARTHROPLASTY (Left)   Consults: None  HPI: Tami Pearson is a 47 y.o. female with end stage arthritis of her left hip with progressively worsening pain and dysfunction. Pain occurs with activity and rest including pain at night. She has tried analgesics, protected weight bearing and rest without benefit. Pain is too severe to attempt physical therapy. Radiographs demonstrate bone on bone arthritis with subchondral cyst formation. She presents now for left THA.  Laboratory Data: Admission on 11/15/2012, Discharged on 11/19/2012  Component Date Value Range Status  . Glucose-Capillary 11/15/2012 104* 70 - 99 mg/dL Final  . Comment 1 40/98/1191 Documented in Chart   Final  . Glucose-Capillary 11/15/2012 95  70 - 99 mg/dL Final  . Comment 1 47/82/9562 Documented in Chart   Final  . Comment 2 11/15/2012 Notify RN   Final  . WBC 11/16/2012 9.6  4.0 - 10.5 K/uL Final  . RBC 11/16/2012 3.41* 3.87 - 5.11 MIL/uL Final  . Hemoglobin 11/16/2012 9.9* 12.0 - 15.0 g/dL Final  . HCT 13/09/6576 30.4* 36.0 - 46.0 % Final  . MCV 11/16/2012 89.1  78.0 - 100.0 fL Final  . MCH 11/16/2012 29.0  26.0 - 34.0 pg Final  . MCHC 11/16/2012 32.6   30.0 - 36.0 g/dL Final  . RDW 46/96/2952 13.3  11.5 - 15.5 % Final  . Platelets 11/16/2012 295  150 - 400 K/uL Final  . Sodium 11/16/2012 138  135 - 145 mEq/L Final  . Potassium 11/16/2012 3.5  3.5 - 5.1 mEq/L Final  . Chloride 11/16/2012 105  96 - 112 mEq/L Final  . CO2 11/16/2012 25  19 - 32 mEq/L Final  . Glucose, Bld 11/16/2012 127* 70 - 99 mg/dL Final  . BUN 84/13/2440 10  6 - 23 mg/dL Final  . Creatinine, Ser 11/16/2012 1.20* 0.50 - 1.10 mg/dL Final  . Calcium 11/28/2534 8.6  8.4 - 10.5 mg/dL Final  . GFR calc non Af Amer 11/16/2012 53* >90 mL/min Final  . GFR calc Af Amer 11/16/2012 61* >90 mL/min Final   Comment: (NOTE)                          The eGFR has been calculated using the CKD EPI equation.                          This calculation has not been validated in all clinical situations.                          eGFR's persistently <90 mL/min signify possible Chronic Kidney  Disease.  . Glucose-Capillary 11/15/2012 138* 70 - 99 mg/dL Final  . Glucose-Capillary 11/15/2012 119* 70 - 99 mg/dL Final  . Comment 1 65/78/4696 Notify RN   Final  . Glucose-Capillary 11/16/2012 119* 70 - 99 mg/dL Final  . Glucose-Capillary 11/16/2012 117* 70 - 99 mg/dL Final  . WBC 29/52/8413 15.5* 4.0 - 10.5 K/uL Final  . RBC 11/17/2012 3.51* 3.87 - 5.11 MIL/uL Final  . Hemoglobin 11/17/2012 10.3* 12.0 - 15.0 g/dL Final  . HCT 24/40/1027 31.3* 36.0 - 46.0 % Final  . MCV 11/17/2012 89.2  78.0 - 100.0 fL Final  . MCH 11/17/2012 29.3  26.0 - 34.0 pg Final  . MCHC 11/17/2012 32.9  30.0 - 36.0 g/dL Final  . RDW 25/36/6440 13.5  11.5 - 15.5 % Final  . Platelets 11/17/2012 293  150 - 400 K/uL Final  . Sodium 11/17/2012 134* 135 - 145 mEq/L Final  . Potassium 11/17/2012 3.3* 3.5 - 5.1 mEq/L Final  . Chloride 11/17/2012 100  96 - 112 mEq/L Final  . CO2 11/17/2012 23  19 - 32 mEq/L Final  . Glucose, Bld 11/17/2012 135* 70 - 99 mg/dL Final  . BUN 34/74/2595 9  6 - 23 mg/dL Final    . Creatinine, Ser 11/17/2012 1.47* 0.50 - 1.10 mg/dL Final  . Calcium 63/87/5643 8.6  8.4 - 10.5 mg/dL Final  . GFR calc non Af Amer 11/17/2012 41* >90 mL/min Final  . GFR calc Af Amer 11/17/2012 48* >90 mL/min Final   Comment: (NOTE)                          The eGFR has been calculated using the CKD EPI equation.                          This calculation has not been validated in all clinical situations.                          eGFR's persistently <90 mL/min signify possible Chronic Kidney                          Disease.  . Glucose-Capillary 11/16/2012 106* 70 - 99 mg/dL Final  . Glucose-Capillary 11/16/2012 157* 70 - 99 mg/dL Final  . Glucose-Capillary 11/17/2012 130* 70 - 99 mg/dL Final  . Glucose-Capillary 11/17/2012 108* 70 - 99 mg/dL Final  . WBC 32/95/1884 13.3* 4.0 - 10.5 K/uL Final  . RBC 11/18/2012 3.21* 3.87 - 5.11 MIL/uL Final  . Hemoglobin 11/18/2012 9.2* 12.0 - 15.0 g/dL Final  . HCT 16/60/6301 28.7* 36.0 - 46.0 % Final  . MCV 11/18/2012 89.4  78.0 - 100.0 fL Final  . MCH 11/18/2012 28.7  26.0 - 34.0 pg Final  . MCHC 11/18/2012 32.1  30.0 - 36.0 g/dL Final  . RDW 60/11/9321 13.4  11.5 - 15.5 % Final  . Platelets 11/18/2012 260  150 - 400 K/uL Final  . Sodium 11/18/2012 137  135 - 145 mEq/L Final  . Potassium 11/18/2012 3.9  3.5 - 5.1 mEq/L Final  . Chloride 11/18/2012 102  96 - 112 mEq/L Final  . CO2 11/18/2012 26  19 - 32 mEq/L Final  . Glucose, Bld 11/18/2012 109* 70 - 99 mg/dL Final  . BUN 55/73/2202 11  6 - 23 mg/dL Final  . Creatinine, Ser 11/18/2012 1.40* 0.50 - 1.10  mg/dL Final  . Calcium 78/29/5621 8.9  8.4 - 10.5 mg/dL Final  . GFR calc non Af Amer 11/18/2012 44* >90 mL/min Final  . GFR calc Af Amer 11/18/2012 51* >90 mL/min Final   Comment: (NOTE)                          The eGFR has been calculated using the CKD EPI equation.                          This calculation has not been validated in all clinical situations.                          eGFR's  persistently <90 mL/min signify possible Chronic Kidney                          Disease.  . Glucose-Capillary 11/17/2012 124* 70 - 99 mg/dL Final  . Glucose-Capillary 11/17/2012 113* 70 - 99 mg/dL Final  . Comment 1 30/86/5784 Notify RN   Final  . Glucose-Capillary 11/18/2012 131* 70 - 99 mg/dL Final  . Glucose-Capillary 11/18/2012 130* 70 - 99 mg/dL Final  . Comment 1 69/62/9528 Notify RN   Final  . Glucose-Capillary 11/18/2012 98  70 - 99 mg/dL Final  . Comment 1 41/32/4401 Documented in Chart   Final  . Comment 2 11/18/2012 Notify RN   Final  . Glucose-Capillary 11/18/2012 141* 70 - 99 mg/dL Final  . Comment 1 02/72/5366 Notify RN   Final  . Comment 2 11/18/2012 Documented in Chart   Final  . Glucose-Capillary 11/19/2012 125* 70 - 99 mg/dL Final  . Comment 1 44/04/4740 Documented in Chart   Final  . Comment 2 11/19/2012 Notify RN   Final  . Glucose-Capillary 11/19/2012 121* 70 - 99 mg/dL Final  . Comment 1 59/56/3875 Documented in Chart   Final  . Comment 2 11/19/2012 Notify RN   Final  Hospital Outpatient Visit on 11/07/2012  Component Date Value Range Status  . ABO/RH(D) 11/07/2012 B POS   Final  Hospital Outpatient Visit on 11/07/2012  Component Date Value Range Status  . MRSA, PCR 11/07/2012 NEGATIVE  NEGATIVE Final  . Staphylococcus aureus 11/07/2012 NEGATIVE  NEGATIVE Final   Comment:                                 The Xpert SA Assay (FDA                          approved for NASAL specimens                          in patients over 24 years of age),                          is one component of                          a comprehensive surveillance                          program.  Test performance has  been validated by [facility] for patients greater                          than or equal to 43 year old.                          It is not intended                          to diagnose infection nor to                           guide or monitor treatment.  Marland Kitchen aPTT 11/07/2012 30  24 - 37 seconds Final  . WBC 11/07/2012 7.6  4.0 - 10.5 K/uL Final  . RBC 11/07/2012 4.23  3.87 - 5.11 MIL/uL Final  . Hemoglobin 11/07/2012 12.4  12.0 - 15.0 g/dL Final  . HCT 16/11/9602 37.6  36.0 - 46.0 % Final  . MCV 11/07/2012 88.9  78.0 - 100.0 fL Final  . MCH 11/07/2012 29.3  26.0 - 34.0 pg Final  . MCHC 11/07/2012 33.0  30.0 - 36.0 g/dL Final  . RDW 54/10/8117 12.9  11.5 - 15.5 % Final  . Platelets 11/07/2012 411* 150 - 400 K/uL Final  . Sodium 11/07/2012 136  135 - 145 mEq/L Final  . Potassium 11/07/2012 3.4* 3.5 - 5.1 mEq/L Final  . Chloride 11/07/2012 100  96 - 112 mEq/L Final  . CO2 11/07/2012 21  19 - 32 mEq/L Final  . Glucose, Bld 11/07/2012 103* 70 - 99 mg/dL Final  . BUN 14/78/2956 19  6 - 23 mg/dL Final  . Creatinine, Ser 11/07/2012 1.39* 0.50 - 1.10 mg/dL Final  . Calcium 21/30/8657 10.0  8.4 - 10.5 mg/dL Final  . Total Protein 11/07/2012 9.1* 6.0 - 8.3 g/dL Final  . Albumin 84/69/6295 3.4* 3.5 - 5.2 g/dL Final  . AST 28/41/3244 14  0 - 37 U/L Final  . ALT 11/07/2012 9  0 - 35 U/L Final  . Alkaline Phosphatase 11/07/2012 66  39 - 117 U/L Final  . Total Bilirubin 11/07/2012 0.1* 0.3 - 1.2 mg/dL Final  . GFR calc non Af Amer 11/07/2012 44* >90 mL/min Final  . GFR calc Af Amer 11/07/2012 51* >90 mL/min Final   Comment: (NOTE)                          The eGFR has been calculated using the CKD EPI equation.                          This calculation has not been validated in all clinical situations.                          eGFR's persistently <90 mL/min signify possible Chronic Kidney                          Disease.  Marland Kitchen Prothrombin Time 11/07/2012 13.1  11.6 - 15.2 seconds Final  . INR 11/07/2012 1.01  0.00 - 1.49 Final  . ABO/RH(D) 11/07/2012 B POS  Final  . Antibody Screen 11/07/2012 NEG   Final  . Sample Expiration 11/07/2012 11/18/2012   Final  . Color, Urine 11/07/2012 YELLOW  YELLOW Final  .  APPearance 11/07/2012 CLOUDY* CLEAR Final  . Specific Gravity, Urine 11/07/2012 1.011  1.005 - 1.030 Final  . pH 11/07/2012 6.5  5.0 - 8.0 Final  . Glucose, UA 11/07/2012 NEGATIVE  NEGATIVE mg/dL Final  . Hgb urine dipstick 11/07/2012 TRACE* NEGATIVE Final  . Bilirubin Urine 11/07/2012 NEGATIVE  NEGATIVE Final  . Ketones, ur 11/07/2012 NEGATIVE  NEGATIVE mg/dL Final  . Protein, ur 16/11/9602 NEGATIVE  NEGATIVE mg/dL Final  . Urobilinogen, UA 11/07/2012 0.2  0.0 - 1.0 mg/dL Final  . Nitrite 54/10/8117 NEGATIVE  NEGATIVE Final  . Leukocytes, UA 11/07/2012 LARGE* NEGATIVE Final  . Preg, Serum 11/07/2012 NEGATIVE  NEGATIVE Final   Comment:                                 THE SENSITIVITY OF THIS                          METHODOLOGY IS >10 mIU/mL.  Marland Kitchen Squamous Epithelial / LPF 11/07/2012 MANY* RARE Final  . WBC, UA 11/07/2012 TOO NUMEROUS TO COUNT  <3 WBC/hpf Final  . RBC / HPF 11/07/2012 11-20  <3 RBC/hpf Final  . Bacteria, UA 11/07/2012 FEW* RARE Final  . Urine-Other 11/07/2012 TRICHOMONAS PRESENT   Final  . Specimen Description 11/07/2012 URINE, CLEAN CATCH   Final  . Special Requests 11/07/2012 NONE   Final  . Culture  Setup Time 11/07/2012    Final                   Value:11/08/2012 00:21                         Performed at [facility]  . Colony Count 11/07/2012    Final                   Value:20,OOO COLONIES/ML                         Performed at [facility]  . Culture 11/07/2012    Final                   Value:Multiple bacterial morphotypes present, none predominant. Suggest appropriate recollection if clinically indicated.                         Performed at [facility]  . Report Status 11/07/2012 11/08/2012 FINAL   Final     X-Rays:Dg Chest 2 View  11/07/2012   CLINICAL DATA:  Preop for left total hip. Nonsmoker. History of hypertension.  EXAM: CHEST  2 VIEW  COMPARISON:  None  FINDINGS: The heart is mildly enlarged. There are no focal  consolidations or pleural effusions. No pulmonary edema. Visualized osseous structures have a normal appearance.  IMPRESSION: 1. Cardiomegaly without pulmonary edema. 2.  No evidence for acute cardiopulmonary abnormality.   Electronically Signed   By: Rosalie Gums M.D.   On: 11/07/2012 15:55   Dg Hip Complete Left  11/07/2012   CLINICAL DATA:  Preoperative evaluation for total hip replacement, history hypertension, diabetes  EXAM: LEFT HIP - COMPLETE 2+ VIEW  COMPARISON:  None  FINDINGS: Bones appear mildly demineralized.  Osteoarthritic changes of the hip joints bilaterally with joint space narrowing and spur formation as well as subchondral sclerosis.  No acute fracture, dislocation or bone destruction.  Degenerative disc disease changes lower lumbar spine.  Regional soft tissues unremarkable.  IMPRESSION: Osteoarthritic changes of bilateral hip joints.   Electronically Signed   By: Ulyses Southward M.D.   On: 11/07/2012 15:55   Dg Pelvis Portable  11/15/2012   CLINICAL DATA:  Postop left hip arthroplasty.  EXAM: PORTABLE PELVIS  COMPARISON:  11/07/2012 radiographs.  FINDINGS: Patient has undergone interval left total hip arthroplasty. There is a screw fixed acetabular component which appears well positioned. There is a proximal femoral cerclage wire. No acute fracture or dislocation is identified. A surgical drain is in place with some gas in the soft tissues around the proximal left femur. Changes of right hip osteoarthritis are again noted.  IMPRESSION: No demonstrated complication following left total hip arthroplasty.   Electronically Signed   By: Roxy Horseman M.D.   On: 11/15/2012 11:06   Mm Digital Screening  10/20/2012   *RADIOLOGY REPORT*  Clinical Data: Screening.  DIGITAL SCREENING BILATERAL MAMMOGRAM WITH CAD  Comparison:  Previous exam(s).  FINDINGS:  ACR Breast Density Category b:  There are scattered areas of fibroglandular density.  There are no findings suspicious for malignancy.  Images were  processed with CAD.  IMPRESSION: No mammographic evidence of malignancy.  A result letter of this screening mammogram will be mailed directly to the patient.  RECOMMENDATION: Screening mammogram in one year. (Code:SM-B-01Y)  BI-RADS CATEGORY 1:  Negative.   Original Report Authenticated By: Sherian Rein, M.D.    EKG: Orders placed in visit on 09/19/12  . EKG 12-LEAD     Hospital Course: Patient was admitted to [facility] and taken to the OR and underwent the above state procedure without complications.  Patient tolerated the procedure well and was later transferred to the recovery room and then to the orthopaedic floor for postoperative care.  They were given PO and IV analgesics for pain control following their surgery.  They were given 24 hours of postoperative antibiotics of  Anti-infectives   Start     Dose/Rate Route Frequency Ordered Stop   11/15/12 1400  ceFAZolin (ANCEF) IVPB 2 g/50 mL premix     2 g 100 mL/hr over 30 Minutes Intravenous Every 6 hours 11/15/12 1240 11/15/12 2014   11/15/12 0615  ceFAZolin (ANCEF) IVPB 2 g/50 mL premix     2 g 100 mL/hr over 30 Minutes Intravenous On call to O.R. 11/15/12 1610 11/15/12 0829     and started on DVT prophylaxis in the form of Xarelto.   PT and OT were ordered for total hip protocol.  The patient was allowed to be WBAT with therapy. Discharge planning was consulted to help with postop disposition and equipment needs.  Patient had a tough night on the evening of surgery with pain.  They started to get up OOB with therapy on day one walking over 20 feet.  Hemovac drain was pulled without difficulty.  The knee immobilizer was removed and discontinued.  Continued to work with therapy into day two.  Dressing was changed on day two and the incision was healing well.  By day three, the patient had progressed with therapy and meeting their goals.  Incision was healing well.  She was tired getting up with PT and needed one more day. Patient was  seen  in rounds on POD 4 and was ready to go home later that day after therapy.   Discharge Medications: Prior to Admission medications   Medication Sig Start Date End Date Taking? Authorizing Provider  metFORMIN (GLUCOPHAGE) 500 MG tablet Take 500 mg by mouth daily with breakfast.   Yes Historical Provider, MD  triamterene-hydrochlorothiazide (MAXZIDE) 75-50 MG per tablet Take 1 tablet by mouth every morning.   Yes Historical Provider, MD  methocarbamol (ROBAXIN) 500 MG tablet Take 1 tablet (500 mg total) by mouth every 6 (six) hours as needed. 11/17/12   Oland Arquette, PA-C  oxyCODONE (OXY IR/ROXICODONE) 5 MG immediate release tablet Take 1-2 tablets (5-10 mg total) by mouth every 3 (three) hours as needed. 11/17/12   Deondra Labrador Julien Girt, PA-C  rivaroxaban (XARELTO) 10 MG TABS tablet Take 1 tablet (10 mg total) by mouth daily with breakfast. Take Xarelto for two and a half more weeks, then discontinue Xarelto. Once the patient has completed the blood thinner regimen, then take a Baby 81 mg Aspirin daily for four more weeks. 11/17/12   Tyree Vandruff, PA-C  traMADol (ULTRAM) 50 MG tablet Take 1-2 tablets (50-100 mg total) by mouth every 6 (six) hours as needed (mild pain). 11/17/12   Orlanda Frankum Julien Girt, PA-C   Diet - Cardiac diet and Diabetic diet  Follow up - in 2 weeks  Activity - WBAT  Disposition - Home  Condition Upon Discharge - improving D/C Meds - See DC Summary  DVT Prophylaxis - Xarelto       Discharge Orders   Future Appointments Provider Department Dept Phone   02/19/2013 1:00 PM Kerri Perches, MD [facility] [phone]   Patient should bring all necessary paperwork to be completed.  Arrive 15 minutes prior to the appointment.   Future Orders Complete By Expires   Call MD / Call 911  As directed    Comments:     If you experience chest pain or shortness of breath, CALL 911 and be transported to the hospital emergency room.  If you develope a  fever above 101 F, pus (white drainage) or increased drainage or redness at the wound, or calf pain, call your surgeon's office.   Change dressing  As directed    Comments:     You may change your dressing dressing daily with sterile 4 x 4 inch gauze dressing and paper tape.  Do not submerge the incision under water.   Constipation Prevention  As directed    Comments:     Drink plenty of fluids.  Prune juice may be helpful.  You may use a stool softener, such as Colace (over the counter) 100 mg twice a day.  Use MiraLax (over the counter) for constipation as needed.   Diet - low sodium heart healthy  As directed    Discharge instructions  As directed    Comments:     Pick up stool softner and laxative for home. Do not submerge incision under water. May shower. Continue to use ice for pain and swelling from surgery. Hip precautions.  Total Hip Protocol.  Take Xarelto for two and a half more weeks, then discontinue Xarelto.   Do not sit on low chairs, stoools or toilet seats, as it may be difficult to get up from low surfaces  As directed    Driving restrictions  As directed    Comments:     No driving until released by the physician.   Follow the hip precautions as taught in  Physical Therapy  As directed    Increase activity slowly as tolerated  As directed    Lifting restrictions  As directed    Comments:     No lifting until released by the physician.   Patient may shower  As directed    Comments:     You may shower without a dressing once there is no drainage.  Do not wash over the wound.  If drainage remains, do not shower until drainage stops.   TED hose  As directed    Comments:     Use stockings (TED hose) for 3 weeks on both leg(s).  You may remove them at night for sleeping.   Weight bearing as tolerated  As directed    Questions:     Laterality:     Extremity:         Medication List    STOP taking these medications       cholecalciferol 1000 UNITS tablet    Commonly known as:  VITAMIN D     HYDROcodone-acetaminophen 10-325 MG per tablet  Commonly known as:  NORCO      TAKE these medications       iron polysaccharides 150 MG capsule  Commonly known as:  NIFEREX  Take 1 capsule (150 mg total) by mouth 2 (two) times daily.     metFORMIN 500 MG tablet  Commonly known as:  GLUCOPHAGE  Take 500 mg by mouth daily with breakfast.     methocarbamol 500 MG tablet  Commonly known as:  ROBAXIN  Take 1 tablet (500 mg total) by mouth every 6 (six) hours as needed.     oxyCODONE 5 MG immediate release tablet  Commonly known as:  Oxy IR/ROXICODONE  Take 1-2 tablets (5-10 mg total) by mouth every 3 (three) hours as needed.     rivaroxaban 10 MG Tabs tablet  Commonly known as:  XARELTO  - Take 1 tablet (10 mg total) by mouth daily with breakfast. Take Xarelto for two and a half more weeks, then discontinue Xarelto.  - Once the patient has completed the blood thinner regimen, then take a Baby 81 mg Aspirin daily for four more weeks.     traMADol 50 MG tablet  Commonly known as:  ULTRAM  Take 1-2 tablets (50-100 mg total) by mouth every 6 (six) hours as needed (mild pain).     triamterene-hydrochlorothiazide 75-50 MG per tablet  Commonly known as:  MAXZIDE  Take 1 tablet by mouth every morning.       Follow-up Information   Follow up with Loanne Drilling, MD. Schedule an appointment as soon as possible for a visit on 11/30/2012.   Specialty:  Orthopedic Surgery   Contact information:   [address] [place] [address] [phone]       Follow up with [facility]. [facility])    Contact information:   [address] [place] [address] [phone]       Signed: Patrica Duel 12/07/2012, 11:16 AM

## 2012-11-18 LAB — BASIC METABOLIC PANEL
BUN: 11 mg/dL (ref 6–23)
CO2: 26 mEq/L (ref 19–32)
Chloride: 102 mEq/L (ref 96–112)
GFR calc non Af Amer: 44 mL/min — ABNORMAL LOW (ref >90)
Glucose, Bld: 109 mg/dL — ABNORMAL HIGH (ref 70–99)
Potassium: 3.9 mEq/L (ref 3.5–5.1)
Sodium: 137 mEq/L (ref 135–145)

## 2012-11-18 LAB — GLUCOSE, CAPILLARY
Glucose-Capillary: 131 mg/dL — ABNORMAL HIGH (ref 70–99)
Glucose-Capillary: 130 mg/dL — ABNORMAL HIGH (ref 70–99)
Glucose-Capillary: 98 mg/dL (ref 70–99)

## 2012-11-18 LAB — CBC
HCT: 28.7 % — ABNORMAL LOW (ref 36.0–46.0)
Hemoglobin: 9.2 g/dL — ABNORMAL LOW (ref 12.0–15.0)
MCHC: 32.1 g/dL (ref 30.0–36.0)
RBC: 3.21 MIL/uL — ABNORMAL LOW (ref 3.87–5.11)
RDW: 13.4 % (ref 11.5–15.5)
WBC: 13.3 10*3/uL — ABNORMAL HIGH (ref 4.0–10.5)

## 2012-11-18 MED ORDER — POLYSACCHARIDE IRON COMPLEX 150 MG PO CAPS: 150.0000 mg | ORAL_CAPSULE | Freq: Two times a day (BID) | ORAL | Status: DC

## 2012-11-18 MED ORDER — POLYSACCHARIDE IRON COMPLEX 150 MG PO CAPS
150.0000 mg | ORAL_CAPSULE | Freq: Two times a day (BID) | ORAL | Status: DC
  Administered 2012-11-18 – 2012-11-19 (×3): 150 mg via ORAL
  Filled 2012-11-18 (×4): qty 1

## 2012-11-18 MED ORDER — GLUCERNA SHAKE PO LIQD
237.0000 mL | Freq: Three times a day (TID) | ORAL | Status: DC
  Administered 2012-11-18 – 2012-11-19 (×3): 237 mL via ORAL
  Filled 2012-11-18 (×5): qty 237

## 2012-11-18 NOTE — Progress Notes (Signed)
Physical Therapy Treatment Patient Details Name: Tami Pearson MRN: [id number] DOB: [birth year] Today's Date: 11/18/2012 Time: 9147-8295 PT Time Calculation (min): 17 min  PT Assessment / Plan / Recommendation  History of Present Illness s/p L posterior THA on 11/15/12   PT Comments   POD # 3 pm session.  Pt was premedicated.  Assisted out of recliner to amb in hallway and attempt 2 steps pt has to enter her home.  Pt cont to progress slowly and requires increased time to complete task.  Amb limited distance in hallway and pt was only able tp perform up one step backward with RW with great difficulty.  Amb back to room and assisted back to bed. Pt has not yet met goals to D/C to home. Pt has 2 steps to enter the home.    Follow Up Recommendations  Home health PT;Supervision/Assistance - 24 hour     Does the patient have the potential to tolerate intense rehabilitation     Barriers to Discharge        Equipment Recommendations  Rolling walker with 5" wheels    Recommendations for Other Services    Frequency 7X/week   Progress towards PT Goals Progress towards PT goals: Progressing toward goals  Plan      Precautions / Restrictions Precautions Precautions: Posterior Hip Restrictions Weight Bearing Restrictions: No LLE Weight Bearing: Weight bearing as tolerated    Pertinent Vitals/Pain C/o 8//10 pain Pre medicated    Mobility  Bed Mobility Bed Mobility: Sit to Supine Supine to Sit: 3: Mod assist Sitting - Scoot to Edge of Bed: 4: Min assist Sit to Supine: 2: Max assist Details for Bed Mobility Assistance: Max assist to support B LE up onto bed Transfers Transfers: Sit to Stand;Stand to Sit Sit to Stand: 4: Min guard;From chair/3-in-1 Stand to Sit: 4: Min guard;To bed Details for Transfer Assistance: 25% VC's on safety with turns and increased time.   Ambulation/Gait Ambulation/Gait Assistance: 4: Min guard Ambulation Distance (Feet): 22 Feet Assistive device:  Rolling walker Ambulation/Gait Assistance Details: progressing slowly due to pain and MAX c/o fatigue.  Gait Pattern: Step-to pattern;Decreased stance time - left;Decreased hip/knee flexion - left;Decreased step length - left Gait velocity: decreased Stairs: Yes Stairs Assistance: 4: Min assist Stairs Assistance Details (indicate cue type and reason): 50% VC's pt was only able to complete one step with much effort. Stair Management Technique: Backwards Number of Stairs: 1     PT Goals (current goals can now be found in the care plan section)    Visit Information  Last PT Received On: 11/18/12 Assistance Needed: +1 History of Present Illness: s/p L posterior THA on 11/15/12    Subjective Data      Cognition       Balance     End of Session PT - End of Session Equipment Utilized During Treatment: Gait belt Activity Tolerance: Patient limited by fatigue;Patient limited by pain Patient left: in bed;with call bell/phone within reach Nurse Communication:  (pain meds)   Felecia Shelling  PTA [facility]  Acute  Rehab Pager      [phone]

## 2012-11-18 NOTE — Progress Notes (Signed)
   Subjective: 3 Days Post-Op Procedure(s) (LRB): LEFT TOTAL HIP ARTHROPLASTY (Left) Patient reports pain as mild.   Patient seen in rounds with Dr. Lequita Halt. Family in room Patient is well, but has had some minor complaints of pain and tired when getting up. Patient might be ready to go home if she improves otherwise keep today.  Objective: Vital signs in last 24 hours: Temp:  [99.4 F (37.4 C)-100.3 F (37.9 C)] 99.4 F (37.4 C) (10/18 0520) Pulse Rate:  [90-107] 90 (10/18 0520) Resp:  [20] 20 (10/18 0520) BP: (104-128)/(69-81) 115/81 mmHg (10/18 0520) SpO2:  [93 %-99 %] 98 % (10/18 0520)  Intake/Output from previous day:  Intake/Output Summary (Last 24 hours) at 11/18/12 0806 Last data filed at 11/18/12 0500  Gross per 24 hour  Intake    480 ml  Output      0 ml  Net    480 ml     Labs:  Recent Labs  11/16/12 0526 11/17/12 0405 11/18/12 0415  HGB 9.9* 10.3* 9.2*    Recent Labs  11/17/12 0405 11/18/12 0415  WBC 15.5* 13.3*  RBC 3.51* 3.21*  HCT 31.3* 28.7*  PLT 293 260    Recent Labs  11/17/12 0405 11/18/12 0415  NA 134* 137  K 3.3* 3.9  CL 100 102  CO2 23 26  BUN 9 11  CREATININE 1.47* 1.40*  GLUCOSE 135* 109*  CALCIUM 8.6 8.9   No results found for this basename: LABPT, INR,  in the last 72 hours  EXAM: General - Patient is Alert, Appropriate and Oriented Extremity - Neurovascular intact Sensation intact distally Dorsiflexion/Plantar flexion intact Incision - clean, dry, no drainage, healing Motor Function - intact, moving foot and toes well on exam.   Assessment/Plan: 3 Days Post-Op Procedure(s) (LRB): LEFT TOTAL HIP ARTHROPLASTY (Left) Procedure(s) (LRB): LEFT TOTAL HIP ARTHROPLASTY (Left) Past Medical History  Diagnosis Date  . Hypertension   . Arthritis 2010 approx    bilateral hip and knee pain  . Diabetes mellitus without complication    Principal Problem:   OA (osteoarthritis) of hip Active Problems:   Postoperative  anemia due to acute blood loss   Hyponatremia   Hypokalemia  Estimated body mass index is 42.17 kg/(m^2) as calculated from the following:   Height as of this encounter: 5\' 3"  (1.6 m).   Weight as of this encounter: 107.956 kg (238 lb). Up with therapy Discharge home with home health if she meets goals Possible today but definitely needs two sessions today and will see how she does. Keep if not meeting goals.  Home if improves.  Diet - Cardiac diet and Diabetic diet Follow up - in 2 weeks Activity - WBAT Disposition - Home Condition Upon Discharge - Pending D/C Meds - See DC Summary DVT Prophylaxis - Xarelto  PERKINS, ALEXZANDREW 11/18/2012, 8:06 AM

## 2012-11-18 NOTE — Progress Notes (Signed)
Physical Therapy Treatment Patient Details Name: Tami Pearson MRN: [id number] DOB: 03-26-1965 Today's Date: 11/18/2012 Time: 4098-1191 PT Time Calculation (min): 25 min  PT Assessment / Plan / Recommendation  History of Present Illness s/p L posterior THA on 11/15/12   PT Comments   POD # 3 am session.  Assisted pt from supine to EOB required nearly 7 min to complete.  Pt progressing slowly and only amb to doorway approx 6 feet due to MAX c/o pain and difficulty advancing L LE.  Pt plans to D/C to home with mother but have been unable to attempt negotiating stairs.    Follow Up Recommendations  Home health PT;Supervision/Assistance - 24 hour     Does the patient have the potential to tolerate intense rehabilitation     Barriers to Discharge        Equipment Recommendations  Rolling walker with 5" wheels    Recommendations for Other Services    Frequency 7X/week   Progress towards PT Goals Progress towards PT goals: Progressing toward goals (slowly)  Plan      Precautions / Restrictions Precautions Precautions: Posterior Hip Restrictions Weight Bearing Restrictions: No LLE Weight Bearing: Weight bearing as tolerated    Pertinent Vitals/Pain C/o 10/10 hip pain with act Pain meds requested repositioned    Mobility  Bed Mobility Bed Mobility: Supine to Sit Supine to Sit: 3: Mod assist Sitting - Scoot to Edge of Bed: 4: Min assist Details for Bed Mobility Assistance: increased time nearly 7 min to complete task Transfers Transfers: Sit to Stand;Stand to Sit Sit to Stand: 4: Min guard;From bed;From elevated surface Stand to Sit: 4: Min guard;To chair/3-in-1 Details for Transfer Assistance: 25% VC's on safety with turns and increased time.   Ambulation/Gait Ambulation/Gait Assistance: 4: Min guard Ambulation Distance (Feet): 6 Feet Assistive device: Rolling walker Ambulation/Gait Assistance Details: decreased amb distance 2nd MAX c/o pain and great difficulty  advancing L LE despite cueing to increase knee flex and perform dorsiflexion.  pt getting emotional and upset "I just can't do this".  Gait Pattern: Step-to pattern;Decreased stance time - left;Decreased hip/knee flexion - left;Decreased step length - left Gait velocity: decreased     PT Goals (current goals can now be found in the care plan section)    Visit Information  Last PT Received On: 11/18/12 Assistance Needed: +1 History of Present Illness: s/p L posterior THA on 11/15/12    Subjective Data      Cognition       Balance     End of Session PT - End of Session Equipment Utilized During Treatment: Gait belt Activity Tolerance: Patient limited by fatigue;Patient limited by pain Patient left: in chair;with family/visitor present;with call bell/phone within reach Nurse Communication:  (pain meds)   Felecia Shelling  PTA [facility]  Acute  Rehab Pager      [phone]

## 2012-11-19 LAB — GLUCOSE, CAPILLARY
Glucose-Capillary: 125 mg/dL — ABNORMAL HIGH (ref 70–99)
Glucose-Capillary: 121 mg/dL — ABNORMAL HIGH (ref 70–99)

## 2012-11-19 NOTE — Progress Notes (Signed)
   Subjective: 4 Days Post-Op Procedure(s) (LRB): LEFT TOTAL HIP ARTHROPLASTY (Left) Patient reports pain as moderate.  Mostly with soreness in her thigh. Plan is to go Home after hospital stay.  Objective: Vital signs in last 24 hours: Temp:  [98.8 F (37.1 C)-99.3 F (37.4 C)] 98.8 F (37.1 C) (10/19 0510) Pulse Rate:  [94-96] 94 (10/19 0510) Resp:  [16-20] 20 (10/19 0510) BP: (112-135)/(77-84) 135/84 mmHg (10/19 0510) SpO2:  [94 %-98 %] 94 % (10/19 0510)  Intake/Output from previous day:  Intake/Output Summary (Last 24 hours) at 11/19/12 0713 Last data filed at 11/19/12 0410  Gross per 24 hour  Intake    920 ml  Output      0 ml  Net    920 ml    Intake/Output this shift:    Labs:  Recent Labs  11/17/12 0405 11/18/12 0415  HGB 10.3* 9.2*    Recent Labs  11/17/12 0405 11/18/12 0415  WBC 15.5* 13.3*  RBC 3.51* 3.21*  HCT 31.3* 28.7*  PLT 293 260    Recent Labs  11/17/12 0405 11/18/12 0415  NA 134* 137  K 3.3* 3.9  CL 100 102  CO2 23 26  BUN 9 11  CREATININE 1.47* 1.40*  GLUCOSE 135* 109*  CALCIUM 8.6 8.9   No results found for this basename: LABPT, INR,  in the last 72 hours  EXAM General - Patient is Alert, Appropriate and Oriented Extremity - Neurologically intact Neurovascular intact Incision: dressing C/D/I No cellulitis present Compartment soft Dressing/Incision - clean, dry, no drainage Motor Function - intact, moving foot and toes well on exam.   Past Medical History  Diagnosis Date  . Hypertension   . Arthritis 2010 approx    bilateral hip and knee pain  . Diabetes mellitus without complication     Assessment/Plan: 4 Days Post-Op Procedure(s) (LRB): LEFT TOTAL HIP ARTHROPLASTY (Left) Principal Problem:   OA (osteoarthritis) of hip Active Problems:   Postoperative anemia due to acute blood loss   Hyponatremia   Hypokalemia   Discharge home with home health if she meets her goals today. If not, then look into SNF  tomorrow  DVT Prophylaxis - Xarelto Weight Bearing As Tolerated left Leg  Anjolie Majer V 11/19/2012, 7:13 AM

## 2012-11-19 NOTE — Progress Notes (Signed)
Physical Therapy Treatment Patient Details Name: JANISSA BERTRAM MRN: [id number] DOB: [birth year] Today's Date: 11/19/2012 Time: 0925-0950 PT Time Calculation (min): 25 min  PT Assessment / Plan / Recommendation  History of Present Illness s/p L posterior THA on 11/15/12   PT Comments   POD #4 pt stated "I am ready to get up out of here", "I'm not going to no nursing home".  Pt got OOB with min assist from mom and amb to in the hallway tot he steps.  Pt was able to get up with 4 inch steps backward with her walker.  Pt has met goals to D/C to home today.   Follow Up Recommendations  Home health PT;Supervision/Assistance - 24 hour     Does the patient have the potential to tolerate intense rehabilitation     Barriers to Discharge        Equipment Recommendations  Rolling walker with 5" wheels    Recommendations for Other Services    Frequency     Progress towards PT Goals Progress towards PT goals: Progressing toward goals  Plan      Precautions / Restrictions Precautions Precautions: Posterior Hip Restrictions Weight Bearing Restrictions: No LLE Weight Bearing: Weight bearing as tolerated    Pertinent Vitals/Pain C/o 3/10 hip pain with act     Mobility  Bed Mobility Bed Mobility: Supine to Sit Supine to Sit: 4: Min assist Details for Bed Mobility Assistance: increased time and assisted by family member Transfers Transfers: Sit to Stand;Stand to [employer] to Stand: 4: Min guard;5: Supervision;From bed Stand to Sit: 4: Min guard;5: Supervision;To toilet Details for Transfer Assistance: 25% VC's on safety with turns and increased time.   Ambulation/Gait Ambulation/Gait Assistance: 4: Min guard;5: Supervision Ambulation Distance (Feet): 20 Feet Assistive device: Rolling walker Ambulation/Gait Assistance Details: better ability to advance L LE and increased gait speed Gait Pattern: Step-to pattern;Decreased stance time - left;Decreased hip/knee flexion -  left;Decreased step length - left Gait velocity: WFL Stairs: Yes Stairs Assistance: 4: Min assist Stairs Assistance Details (indicate cue type and reason): with care giver and 50% VC's on proper sequencing and walker placement with each step. Stair Management Technique: Backwards;With walker Number of Stairs: 3 (4 inch rise)     PT Goals (current goals can now be found in the care plan section)    Visit Information  Last PT Received On: 11/19/12 Assistance Needed: +1 History of Present Illness: s/p L posterior THA on 11/15/12    Subjective Data      Cognition       Balance     End of Session PT - End of Session Equipment Utilized During Treatment: Gait belt Activity Tolerance: Patient tolerated treatment well Patient left: in chair;with call bell/phone within reach;with family/visitor present   Felecia Shelling  PTA [facility]  Acute  Rehab Pager      [phone]

## 2012-11-19 NOTE — Progress Notes (Signed)
Discharged from floor via w/c, family with pt. No changes in assessment. Tami Pearson  

## 2012-11-19 NOTE — Plan of Care (Signed)
Problem: Discharge Progression Outcomes Goal: Anticoagulant follow-up in place Outcome: Not Applicable Date Met:  11/19/12 xarelto

## 2012-11-19 NOTE — Progress Notes (Signed)
   CARE MANAGEMENT NOTE 11/19/2012  Patient:  Tami Pearson, Tami Pearson   Account Number:  [id number]  Date Initiated:  11/16/2012  Documentation initiated by:  Colleen Can  Subjective/Objective Assessment:   dx total hip replacemnt-left; anterior approach     Action/Plan:   CM spoke with patient. Plans are for her to go to her mother-in-laws home when discharged. Spouse and mother-in-law will provide care, She already has RW, shower chair and raised toilet seat.   Anticipated DC Date:  11/18/2012   Anticipated DC Plan:  HOME W HOME HEALTH SERVICES      DC Planning Services  CM consult      [facility] Choice  HOME HEALTH   Choice offered to / List presented to:  C-1 Patient   DME arranged  3-N-1  Levan Hurst      DME agency  [facility]     HH arranged  HH-2 PT      [facility] agency  [facility]   Status of service:  Completed, signed off Medicare Important Message given?   (If response is "NO", the following Medicare IM given date fields will be blank) Date Medicare IM given:   Date Additional Medicare IM given:    Discharge Disposition:  HOME W HOME HEALTH SERVICES  Per UR Regulation:  Reviewed for med. necessity/level of care/duration of stay  If discussed at Long Length of Stay Meetings, dates discussed:    Comments:  11/19/2012 1015 NCM spoke to pt and states she needs wide seated 3n1 and wide RW. Contacted [facility] DME rep for equipment. Notified [facility] of dc home today with HH PT. Provided pt with Xarelto 30 day free trial card. Isidoro Donning RN CCM Case Mgmt phone [phone]

## 2012-11-20 ENCOUNTER — Telehealth: Payer: Self-pay

## 2012-11-20 MED ORDER — FIRST-DUKES MOUTHWASH MT SUSP: OROMUCOSAL | Status: DC

## 2012-11-20 NOTE — Telephone Encounter (Signed)
Send in dukles magic mouthwash 10cc every 8 hours for 10 days please, and let her know

## 2012-11-20 NOTE — Telephone Encounter (Signed)
Med sent.

## 2012-11-20 NOTE — Telephone Encounter (Signed)
Was recently on antibiotics and was in the hospital, and now her tongue is white and sore and she was wanting to know if she could have some mouthwash called in. [employer]

## 2012-11-20 NOTE — Telephone Encounter (Signed)
Patient aware.

## 2013-01-05 ENCOUNTER — Telehealth: Payer: Self-pay

## 2013-01-05 NOTE — Telephone Encounter (Signed)
Need to verify with her surgeon, she recentl;y had surgery, f/u will be next week as has to collect hard script

## 2013-01-09 NOTE — Telephone Encounter (Signed)
Again the chart closed. Pain management is from her surgeon only let her know

## 2013-01-09 NOTE — Telephone Encounter (Signed)
Called and spoke with Tamika at Dr Salina April office and she verified that he switched her from Oxycodone to Tramadol Dec 4. She currently only has tramadol and patient states its not helping her pain. Please advise

## 2013-01-09 NOTE — Telephone Encounter (Signed)
Patient aware.

## 2013-02-19 ENCOUNTER — Encounter (INDEPENDENT_AMBULATORY_CARE_PROVIDER_SITE_OTHER): Payer: Self-pay

## 2013-02-19 ENCOUNTER — Encounter: Payer: Self-pay | Admitting: Family Medicine

## 2013-02-19 ENCOUNTER — Ambulatory Visit (INDEPENDENT_AMBULATORY_CARE_PROVIDER_SITE_OTHER): Payer: BC Managed Care – PPO | Admitting: Family Medicine

## 2013-02-19 VITALS — BP 124/84 | HR 97 | Resp 16 | Ht 63.0 in | Wt 240.0 lb

## 2013-02-19 DIAGNOSIS — E8881 Metabolic syndrome: Secondary | ICD-10-CM

## 2013-02-19 DIAGNOSIS — D539 Nutritional anemia, unspecified: Secondary | ICD-10-CM

## 2013-02-19 DIAGNOSIS — E785 Hyperlipidemia, unspecified: Secondary | ICD-10-CM

## 2013-02-19 DIAGNOSIS — R7303 Prediabetes: Secondary | ICD-10-CM

## 2013-02-19 DIAGNOSIS — Z01818 Encounter for other preprocedural examination: Secondary | ICD-10-CM

## 2013-02-19 DIAGNOSIS — I10 Essential (primary) hypertension: Secondary | ICD-10-CM

## 2013-02-19 DIAGNOSIS — R7309 Other abnormal glucose: Secondary | ICD-10-CM

## 2013-02-19 DIAGNOSIS — M169 Osteoarthritis of hip, unspecified: Secondary | ICD-10-CM

## 2013-02-19 DIAGNOSIS — M1710 Unilateral primary osteoarthritis, unspecified knee: Secondary | ICD-10-CM

## 2013-02-19 DIAGNOSIS — M170 Bilateral primary osteoarthritis of knee: Secondary | ICD-10-CM

## 2013-02-19 DIAGNOSIS — IMO0002 Reserved for concepts with insufficient information to code with codable children: Secondary | ICD-10-CM

## 2013-02-19 DIAGNOSIS — M1610 Unilateral primary osteoarthritis, unspecified hip: Secondary | ICD-10-CM

## 2013-02-19 LAB — CBC WITH DIFFERENTIAL/PLATELET
Basophils Absolute: 0.1 10*3/uL (ref 0.0–0.1)
Basophils Relative: 1 % (ref 0–1)
Eosinophils Absolute: 0.1 10*3/uL (ref 0.0–0.7)
Eosinophils Relative: 1 % (ref 0–5)
HCT: 36.2 % (ref 36.0–46.0)
HEMOGLOBIN: 12.2 g/dL (ref 12.0–15.0)
Lymphocytes Relative: 39 % (ref 12–46)
Lymphs Abs: 2.7 10*3/uL (ref 0.7–4.0)
MCH: 28.3 pg (ref 26.0–34.0)
MCHC: 33.7 g/dL (ref 30.0–36.0)
MCV: 84 fL (ref 78.0–100.0)
MONOS PCT: 7 % (ref 3–12)
Monocytes Absolute: 0.5 10*3/uL (ref 0.1–1.0)
NEUTROS PCT: 52 % (ref 43–77)
Neutro Abs: 3.6 10*3/uL (ref 1.7–7.7)
PLATELETS: 414 10*3/uL — AB (ref 150–400)
RBC: 4.31 MIL/uL (ref 3.87–5.11)
RDW: 14.4 % (ref 11.5–15.5)
WBC: 6.9 10*3/uL (ref 4.0–10.5)

## 2013-02-19 LAB — BASIC METABOLIC PANEL
BUN: 24 mg/dL — AB (ref 6–23)
CO2: 26 mEq/L (ref 19–32)
Calcium: 9.5 mg/dL (ref 8.4–10.5)
Chloride: 103 mEq/L (ref 96–112)
Creat: 0.99 mg/dL (ref 0.50–1.10)
Glucose, Bld: 111 mg/dL — ABNORMAL HIGH (ref 70–99)
POTASSIUM: 3.9 meq/L (ref 3.5–5.3)
Sodium: 140 mEq/L (ref 135–145)

## 2013-02-19 LAB — IRON: Iron: 64 ug/dL (ref 42–145)

## 2013-02-19 MED ORDER — HYDROCODONE-ACETAMINOPHEN 10-325 MG PO TABS: ORAL_TABLET | ORAL | Status: DC

## 2013-02-19 NOTE — Assessment & Plan Note (Signed)
Patient educated about the importance of limiting  Carbohydrate intake , the need to commit to daily physical activity for a minimum of 30 minutes , and to commit weight loss. The fact that changes in all these areas will reduce or eliminate all together the development of diabetes is stressed.   Updated lab today 

## 2013-02-19 NOTE — Progress Notes (Signed)
   Subjective:    Patient ID: Tami Pearson, female    DOB: 06/28/1965, 48 y.o.   MRN: [id number]  HPI  Pt originally scheduled for gyne exam but due to severe arthritic problems this is not possible. Hs had succesful left hip replacement and has upcoming right hip replacement in March.Has had tramadol prescribed from her orthopedic Doc but this is ineffective Requests pain medication form this office, sleep disturbed due to pain, also a handicap sticker. Denies any other symptoms , has no other concerns   Review of Systems See HPI Denies recent fever or chills. Denies sinus pressure, nasal congestion, ear pain or sore throat. Denies chest congestion, productive cough or wheezing. Denies chest pains, palpitations and leg swelling Denies abdominal pain, nausea, vomiting,diarrhea or constipation.   Denies dysuria, frequency, hesitancy or incontinence. Denies headaches, seizures, numbness, or tingling. Denies depression or  anxiety unable to sleep due to pain Denies skin break down or rash.        Objective:   Physical Exam  Patient alert and oriented and in no cardiopulmonary distress.  HEENT: No facial asymmetry, EOMI, no sinus tenderness,  oropharynx pink and moist.  Neck supple no adenopathy.  Chest: Clear to auscultation bilaterally.  CVS: S1, S2 no murmurs, no S3.  ABD: Soft non tender. Bowel sounds normal.  Ext: No edema  MS: Adequate ROM spine and  shoulders, decreased ROM  hips and knees.  Skin: Intact, no ulcerations or rash noted.  Psych: Good eye contact, normal affect. Memory intact not anxious or depressed appearing.  CNS: CN 2-12 intact, power, tone and sensation normal throughout.       Assessment & Plan:

## 2013-02-19 NOTE — Assessment & Plan Note (Signed)
Unchanged. Patient re-educated about  the importance of commitment to a  minimum of 150 minutes of exercise per week. The importance of healthy food choices with portion control discussed. Encouraged to start a food diary, count calories and to consider  joining a support group. Sample diet sheets offered. Goals set by the patient for the next several months.    

## 2013-02-19 NOTE — Assessment & Plan Note (Signed)
Patient clleared for surgery pending labs today. History and exam are within normal except for her morbid obesity and significant reduced mobility due to arthritis. Letter will be sent once labs are reviewed. BP is controlled and pt denies cardiac symptoms

## 2013-02-19 NOTE — Assessment & Plan Note (Signed)
Right hip has marked limitation in mobility and extreme pain with limited movement, surgery planned in March Hydrocodone 3 times daily prescribed, pain contract written and message sent to ortho to this effect will cover till March

## 2013-02-19 NOTE — Patient Instructions (Signed)
Pelvic exam at follow up in 6 month, call if you need me before  Lab today.HBA1C and chem 7 and CBC, iron and ferritin   Blood pressure is good and you will be  cleared for surgery, after labs are reviewed, I will contact surgeon  Pain contract for hydrocodone for 2 months until next operation on right hip and I will let Dr Berton LanAllusio know, pain contract today, and do not take the tramadol prescribed along with this please  A healthy diet is rich in fruit, vegetables and whole grains. Poultry fish, nuts and beans are a healthy choice for protein rather then red meat. A low sodium diet and drinking 64 ounces of water daily is generally recommended. Oils and sweet should be limited. Carbohydrates especially for those who are diabetic or overweight, should be limited to 34-45 gram per meal. It is important to eat on a regular schedule, at least 3 times daily. Snacks should be primarily fruits, vegetables or nuts.

## 2013-02-19 NOTE — Assessment & Plan Note (Signed)
Controlled, no change in medication  

## 2013-02-20 LAB — FERRITIN: FERRITIN: 412 ng/mL — AB (ref 10–291)

## 2013-02-20 LAB — HEMOGLOBIN A1C
HEMOGLOBIN A1C: 6.2 % — AB (ref <5.7)
MEAN PLASMA GLUCOSE: 131 mg/dL — AB (ref <117)

## 2013-02-21 ENCOUNTER — Ambulatory Visit (HOSPITAL_COMMUNITY)
Admission: RE | Admit: 2013-02-21 | Discharge: 2013-02-21 | Disposition: A | Discharge status: Home / Self Care | Payer: BC Managed Care – PPO | Source: Ambulatory Visit | Attending: Cardiovascular Disease | Admitting: Cardiovascular Disease

## 2013-02-21 ENCOUNTER — Other Ambulatory Visit (HOSPITAL_COMMUNITY): Payer: Self-pay | Admitting: Physician Assistant

## 2013-02-21 DIAGNOSIS — M79669 Pain in unspecified lower leg: Secondary | ICD-10-CM

## 2013-02-21 DIAGNOSIS — M79609 Pain in unspecified limb: Secondary | ICD-10-CM

## 2013-02-21 NOTE — Progress Notes (Signed)
Left Lower Extremity Venous Duplex Completed. Negative for DVT and SVT. °Brianna L Mazza,RVT °

## 2013-02-27 ENCOUNTER — Other Ambulatory Visit: Payer: Self-pay

## 2013-02-27 MED ORDER — METFORMIN HCL 500 MG PO TABS
500.0000 mg | ORAL_TABLET | Freq: Two times a day (BID) | ORAL | Status: DC
Start: 2013-02-27 — End: 2013-08-15

## 2013-03-22 ENCOUNTER — Other Ambulatory Visit: Payer: Self-pay

## 2013-03-22 MED ORDER — HYDROCODONE-ACETAMINOPHEN 10-325 MG PO TABS: ORAL_TABLET | ORAL | Status: DC

## 2013-03-23 ENCOUNTER — Encounter (HOSPITAL_COMMUNITY): Payer: Self-pay | Admitting: Pharmacy Technician

## 2013-03-23 NOTE — Progress Notes (Signed)
Please put orders in Epic surgery 04-04-13 pre op 03-29-13 Thank you!

## 2013-03-26 NOTE — Progress Notes (Signed)
Need orders in EPIC.  Surgery scheduled for 04/04/13.  Preop on 03/29/13 at 1100am.  Thank You.

## 2013-03-27 ENCOUNTER — Other Ambulatory Visit: Payer: Self-pay | Admitting: Orthopedic Surgery

## 2013-03-29 ENCOUNTER — Inpatient Hospital Stay (HOSPITAL_COMMUNITY): Admission: RE | Admit: 2013-03-29 | Payer: BC Managed Care – PPO | Source: Ambulatory Visit

## 2013-04-02 NOTE — Patient Instructions (Addendum)
Tami Pearson  04/03/2013                           YOUR PROCEDURE IS SCHEDULED ON:  04/04/13               PLEASE REPORT TO SHORT STAY CENTER AT :  11:00 AM               CALL THIS NUMBER IF ANY PROBLEMS THE DAY OF SURGERY :               [phone]                                REMEMBER:   Do not eat food or drink liquids AFTER MIDNIGHT   May have clear liquids UNTIL 6 HOURS BEFORE SURGERY (8:00 AM)               Take these medicines the morning of surgery with A SIP OF WATER: NONE   Do not wear jewelry, make-up   Do not wear lotions, powders, or perfumes.   Do not shave legs or underarms 12 hrs. before surgery (men may shave face)  Do not bring valuables to the hospital.  Contacts, dentures or bridgework may not be worn into surgery.  Leave suitcase in the car. After surgery it may be brought to your room.  For patients admitted to the hospital more than one night, checkout time is            11:00 AM                                                       The day of discharge.   Patients discharged the day of surgery will not be allowed to drive home.            If going home same day of surgery, must have someone stay with you              FIRST 24 hrs at home and arrange for some one to drive you              home from hospital.    Special Instructions             Please read over the following fact sheets that you were given:               1. [place] PREPARING FOR SURGERY SHEET               2. INCENTIVE SPIROMETER               3. MRSA INFORMATION SHEET                                                X_____________________________________________________________________        Failure to follow these instructions may result in cancellation of your surgery

## 2013-04-03 ENCOUNTER — Encounter (HOSPITAL_COMMUNITY): Payer: Self-pay

## 2013-04-03 ENCOUNTER — Ambulatory Visit (HOSPITAL_COMMUNITY)
Admission: RE | Admit: 2013-04-03 | Discharge: 2013-04-03 | Disposition: A | Discharge status: Home / Self Care | Payer: BC Managed Care – PPO | Source: Ambulatory Visit | Attending: Orthopedic Surgery | Admitting: Orthopedic Surgery

## 2013-04-03 ENCOUNTER — Encounter (HOSPITAL_COMMUNITY)
Admission: RE | Admit: 2013-04-03 | Discharge: 2013-04-03 | Disposition: A | Discharge status: Home / Self Care | Payer: BC Managed Care – PPO | Source: Ambulatory Visit | Attending: Orthopedic Surgery | Admitting: Orthopedic Surgery

## 2013-04-03 ENCOUNTER — Other Ambulatory Visit: Payer: Self-pay | Admitting: Surgical

## 2013-04-03 LAB — URINALYSIS, ROUTINE W REFLEX MICROSCOPIC
BILIRUBIN URINE: NEGATIVE
GLUCOSE, UA: NEGATIVE mg/dL
Hgb urine dipstick: NEGATIVE
Ketones, ur: NEGATIVE mg/dL
Nitrite: NEGATIVE
Protein, ur: NEGATIVE mg/dL
Specific Gravity, Urine: 1.02 (ref 1.005–1.030)
UROBILINOGEN UA: 0.2 mg/dL (ref 0.0–1.0)
pH: 5 (ref 5.0–8.0)

## 2013-04-03 LAB — CBC
HEMATOCRIT: 39.2 % (ref 36.0–46.0)
HEMOGLOBIN: 12.8 g/dL (ref 12.0–15.0)
MCH: 28.6 pg (ref 26.0–34.0)
MCHC: 32.7 g/dL (ref 30.0–36.0)
MCV: 87.7 fL (ref 78.0–100.0)
Platelets: 351 10*3/uL (ref 150–400)
RBC: 4.47 MIL/uL (ref 3.87–5.11)
RDW: 14.4 % (ref 11.5–15.5)
WBC: 7.9 10*3/uL (ref 4.0–10.5)

## 2013-04-03 LAB — COMPREHENSIVE METABOLIC PANEL
ALT: 16 U/L (ref 0–35)
AST: 20 U/L (ref 0–37)
Albumin: 3.3 g/dL — ABNORMAL LOW (ref 3.5–5.2)
Alkaline Phosphatase: 73 U/L (ref 39–117)
BUN: 25 mg/dL — AB (ref 6–23)
CHLORIDE: 100 meq/L (ref 96–112)
CO2: 27 mEq/L (ref 19–32)
Calcium: 9.6 mg/dL (ref 8.4–10.5)
Creatinine, Ser: 1.06 mg/dL (ref 0.50–1.10)
GFR calc non Af Amer: 61 mL/min — ABNORMAL LOW (ref >90)
GFR, EST AFRICAN AMERICAN: 71 mL/min — AB (ref >90)
GLUCOSE: 98 mg/dL (ref 70–99)
POTASSIUM: 3.9 meq/L (ref 3.7–5.3)
SODIUM: 140 meq/L (ref 137–147)
Total Protein: 8.7 g/dL — ABNORMAL HIGH (ref 6.0–8.3)

## 2013-04-03 LAB — HCG, SERUM, QUALITATIVE: PREG SERUM: NEGATIVE

## 2013-04-03 LAB — PROTIME-INR
INR: 0.98 (ref 0.00–1.49)
PROTHROMBIN TIME: 12.8 s (ref 11.6–15.2)

## 2013-04-03 LAB — URINE MICROSCOPIC-ADD ON

## 2013-04-03 LAB — SURGICAL PCR SCREEN
MRSA, PCR: NEGATIVE
Staphylococcus aureus: NEGATIVE

## 2013-04-03 LAB — APTT: APTT: 28 s (ref 24–37)
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
## 2013-04-04 ENCOUNTER — Ambulatory Visit (HOSPITAL_COMMUNITY): Payer: BC Managed Care – PPO | Admitting: Anesthesiology

## 2013-04-04 ENCOUNTER — Inpatient Hospital Stay (HOSPITAL_COMMUNITY)
Admission: RE | Admit: 2013-04-04 | Discharge: 2013-04-07 | DRG: 470 | Disposition: A | Discharge status: Home Health | Payer: BC Managed Care – PPO | Source: Ambulatory Visit | Attending: Orthopedic Surgery | Admitting: Orthopedic Surgery

## 2013-04-04 ENCOUNTER — Encounter (HOSPITAL_COMMUNITY): Payer: Self-pay | Admitting: *Deleted

## 2013-04-04 ENCOUNTER — Encounter (HOSPITAL_COMMUNITY)
Admission: RE | Disposition: A | Discharge status: Home Health | Payer: Self-pay | Source: Ambulatory Visit | Attending: Orthopedic Surgery

## 2013-04-04 ENCOUNTER — Ambulatory Visit (HOSPITAL_COMMUNITY): Payer: BC Managed Care – PPO

## 2013-04-04 ENCOUNTER — Encounter (HOSPITAL_COMMUNITY): Payer: BC Managed Care – PPO | Admitting: Anesthesiology

## 2013-04-04 DIAGNOSIS — E785 Hyperlipidemia, unspecified: Secondary | ICD-10-CM | POA: Diagnosis present

## 2013-04-04 DIAGNOSIS — Z6841 Body Mass Index (BMI) 40.0 and over, adult: Secondary | ICD-10-CM

## 2013-04-04 DIAGNOSIS — M169 Osteoarthritis of hip, unspecified: Secondary | ICD-10-CM | POA: Diagnosis present

## 2013-04-04 DIAGNOSIS — Z96642 Presence of left artificial hip joint: Secondary | ICD-10-CM

## 2013-04-04 DIAGNOSIS — Z8249 Family history of ischemic heart disease and other diseases of the circulatory system: Secondary | ICD-10-CM

## 2013-04-04 DIAGNOSIS — E8881 Metabolic syndrome: Secondary | ICD-10-CM | POA: Diagnosis present

## 2013-04-04 DIAGNOSIS — I10 Essential (primary) hypertension: Secondary | ICD-10-CM | POA: Diagnosis present

## 2013-04-04 DIAGNOSIS — Z79899 Other long term (current) drug therapy: Secondary | ICD-10-CM

## 2013-04-04 DIAGNOSIS — Z96641 Presence of right artificial hip joint: Secondary | ICD-10-CM

## 2013-04-04 DIAGNOSIS — E119 Type 2 diabetes mellitus without complications: Secondary | ICD-10-CM | POA: Diagnosis present

## 2013-04-04 DIAGNOSIS — R7303 Prediabetes: Secondary | ICD-10-CM

## 2013-04-04 DIAGNOSIS — M1610 Unilateral primary osteoarthritis, unspecified hip: Principal | ICD-10-CM | POA: Diagnosis present

## 2013-04-04 DIAGNOSIS — Z96649 Presence of unspecified artificial hip joint: Secondary | ICD-10-CM

## 2013-04-04 DIAGNOSIS — Z833 Family history of diabetes mellitus: Secondary | ICD-10-CM

## 2013-04-04 HISTORY — PX: TOTAL HIP ARTHROPLASTY: SHX124

## 2013-04-04 LAB — TYPE AND SCREEN
ABO/RH(D): B POS
ANTIBODY SCREEN: NEGATIVE

## 2013-04-04 LAB — GLUCOSE, CAPILLARY
GLUCOSE-CAPILLARY: 79 mg/dL (ref 70–99)
Glucose-Capillary: 86 mg/dL (ref 70–99)

## 2013-04-04 SURGERY — ARTHROPLASTY, HIP, TOTAL,POSTERIOR APPROACH
Anesthesia: Spinal | Site: Hip | Laterality: Right

## 2013-04-04 MED ORDER — TRIAMTERENE-HCTZ 75-50 MG PO TABS
1.0000 | ORAL_TABLET | Freq: Every morning | ORAL | Status: DC
  Administered 2013-04-05 – 2013-04-07 (×3): 1 via ORAL
  Filled 2013-04-04 (×3): qty 1

## 2013-04-04 MED ORDER — CEFAZOLIN SODIUM-DEXTROSE 2-3 GM-% IV SOLR
2.0000 g | INTRAVENOUS | Status: AC
  Administered 2013-04-04: 2 g via INTRAVENOUS

## 2013-04-04 MED ORDER — ONDANSETRON HCL 4 MG/2ML IJ SOLN
INTRAMUSCULAR | Status: DC | PRN
  Administered 2013-04-04: 4 mg via INTRAVENOUS

## 2013-04-04 MED ORDER — MIDAZOLAM HCL 2 MG/2ML IJ SOLN
INTRAMUSCULAR | Status: AC
  Filled 2013-04-04: qty 2

## 2013-04-04 MED ORDER — METFORMIN HCL 500 MG PO TABS
500.0000 mg | ORAL_TABLET | Freq: Two times a day (BID) | ORAL | Status: DC
  Administered 2013-04-05: 500 mg via ORAL
  Filled 2013-04-04 (×3): qty 1

## 2013-04-04 MED ORDER — ROCURONIUM BROMIDE 100 MG/10ML IV SOLN
INTRAVENOUS | Status: AC
  Filled 2013-04-04: qty 1

## 2013-04-04 MED ORDER — GLYCOPYRROLATE 0.2 MG/ML IJ SOLN
INTRAMUSCULAR | Status: AC
  Filled 2013-04-04: qty 3

## 2013-04-04 MED ORDER — NEOSTIGMINE METHYLSULFATE 1 MG/ML IJ SOLN
INTRAMUSCULAR | Status: AC
  Filled 2013-04-04: qty 10

## 2013-04-04 MED ORDER — DEXAMETHASONE 6 MG PO TABS
10.0000 mg | ORAL_TABLET | Freq: Every day | ORAL | Status: AC
  Administered 2013-04-05: 10 mg via ORAL
  Filled 2013-04-04: qty 1

## 2013-04-04 MED ORDER — POLYETHYLENE GLYCOL 3350 17 G PO PACK: 17.0000 g | PACK | Freq: Every day | ORAL | Status: DC | PRN

## 2013-04-04 MED ORDER — DEXAMETHASONE SODIUM PHOSPHATE 10 MG/ML IJ SOLN
10.0000 mg | Freq: Every day | INTRAMUSCULAR | Status: AC
  Filled 2013-04-04: qty 1

## 2013-04-04 MED ORDER — BUPIVACAINE LIPOSOME 1.3 % IJ SUSP
20.0000 mL | Freq: Once | INTRAMUSCULAR | Status: DC
  Filled 2013-04-04: qty 20

## 2013-04-04 MED ORDER — MIDAZOLAM HCL 5 MG/5ML IJ SOLN
INTRAMUSCULAR | Status: DC | PRN
  Administered 2013-04-04 (×2): 1 mg via INTRAVENOUS

## 2013-04-04 MED ORDER — ONDANSETRON HCL 4 MG/2ML IJ SOLN: 4.0000 mg | Freq: Four times a day (QID) | INTRAMUSCULAR | Status: DC | PRN

## 2013-04-04 MED ORDER — FENTANYL CITRATE 0.05 MG/ML IJ SOLN
INTRAMUSCULAR | Status: AC
  Filled 2013-04-04: qty 5

## 2013-04-04 MED ORDER — BUPIVACAINE HCL (PF) 0.25 % IJ SOLN
INTRAMUSCULAR | Status: AC
  Filled 2013-04-04: qty 30

## 2013-04-04 MED ORDER — CEFAZOLIN SODIUM-DEXTROSE 2-3 GM-% IV SOLR
2.0000 g | Freq: Four times a day (QID) | INTRAVENOUS | Status: AC
  Administered 2013-04-04 – 2013-04-05 (×2): 2 g via INTRAVENOUS
  Filled 2013-04-04 (×2): qty 50

## 2013-04-04 MED ORDER — FENTANYL CITRATE 0.05 MG/ML IJ SOLN
INTRAMUSCULAR | Status: DC | PRN
  Administered 2013-04-04 (×4): 50 ug via INTRAVENOUS

## 2013-04-04 MED ORDER — BISACODYL 10 MG RE SUPP
10.0000 mg | Freq: Every day | RECTAL | Status: DC | PRN
Start: 2013-04-04 — End: 2013-04-07

## 2013-04-04 MED ORDER — MORPHINE SULFATE 2 MG/ML IJ SOLN: 1.0000 mg | INTRAMUSCULAR | Status: DC | PRN

## 2013-04-04 MED ORDER — ACETAMINOPHEN 10 MG/ML IV SOLN
1000.0000 mg | Freq: Once | INTRAVENOUS | Status: AC
  Administered 2013-04-04: 1000 mg via INTRAVENOUS
  Filled 2013-04-04: qty 100

## 2013-04-04 MED ORDER — PROPOFOL 10 MG/ML IV BOLUS
INTRAVENOUS | Status: AC
  Filled 2013-04-04: qty 20

## 2013-04-04 MED ORDER — DEXAMETHASONE SODIUM PHOSPHATE 10 MG/ML IJ SOLN
INTRAMUSCULAR | Status: AC
  Filled 2013-04-04: qty 1

## 2013-04-04 MED ORDER — DIPHENHYDRAMINE HCL 12.5 MG/5ML PO ELIX
12.5000 mg | ORAL_SOLUTION | ORAL | Status: DC | PRN
  Administered 2013-04-05: 25 mg via ORAL
  Filled 2013-04-04: qty 10

## 2013-04-04 MED ORDER — DOCUSATE SODIUM 100 MG PO CAPS
100.0000 mg | ORAL_CAPSULE | Freq: Two times a day (BID) | ORAL | Status: DC
  Administered 2013-04-04 – 2013-04-07 (×6): 100 mg via ORAL

## 2013-04-04 MED ORDER — HYDROMORPHONE HCL PF 2 MG/ML IJ SOLN
INTRAMUSCULAR | Status: AC
  Filled 2013-04-04: qty 1

## 2013-04-04 MED ORDER — ACETAMINOPHEN 650 MG RE SUPP: 650.0000 mg | Freq: Four times a day (QID) | RECTAL | Status: DC | PRN

## 2013-04-04 MED ORDER — ACETAMINOPHEN 325 MG PO TABS: 650.0000 mg | ORAL_TABLET | Freq: Four times a day (QID) | ORAL | Status: DC | PRN

## 2013-04-04 MED ORDER — PHENYLEPHRINE 40 MCG/ML (10ML) SYRINGE FOR IV PUSH (FOR BLOOD PRESSURE SUPPORT)
PREFILLED_SYRINGE | INTRAVENOUS | Status: AC
  Filled 2013-04-04: qty 10

## 2013-04-04 MED ORDER — LACTATED RINGERS IV SOLN
INTRAVENOUS | Status: DC | PRN
  Administered 2013-04-04 (×2): via INTRAVENOUS

## 2013-04-04 MED ORDER — OXYCODONE HCL 5 MG PO TABS
5.0000 mg | ORAL_TABLET | ORAL | Status: DC | PRN
  Administered 2013-04-04 (×2): 5 mg via ORAL
  Administered 2013-04-04 – 2013-04-07 (×14): 10 mg via ORAL
  Filled 2013-04-04 (×12): qty 2
  Filled 2013-04-04: qty 1
  Filled 2013-04-04: qty 2
  Filled 2013-04-04: qty 1
  Filled 2013-04-04: qty 2

## 2013-04-04 MED ORDER — SODIUM CHLORIDE 0.9 % IJ SOLN
INTRAMUSCULAR | Status: AC
  Filled 2013-04-04: qty 50

## 2013-04-04 MED ORDER — METOCLOPRAMIDE HCL 5 MG/ML IJ SOLN: 5.0000 mg | Freq: Three times a day (TID) | INTRAMUSCULAR | Status: DC | PRN

## 2013-04-04 MED ORDER — PHENYLEPHRINE HCL 10 MG/ML IJ SOLN
INTRAMUSCULAR | Status: AC
  Filled 2013-04-04: qty 1

## 2013-04-04 MED ORDER — TRAMADOL HCL 50 MG PO TABS
50.0000 mg | ORAL_TABLET | Freq: Four times a day (QID) | ORAL | Status: DC | PRN
  Administered 2013-04-06: 100 mg via ORAL
  Filled 2013-04-04: qty 2

## 2013-04-04 MED ORDER — SODIUM CHLORIDE 0.9 % IJ SOLN
INTRAMUSCULAR | Status: DC | PRN
  Administered 2013-04-04: 30 mL via INTRAVENOUS

## 2013-04-04 MED ORDER — POTASSIUM CHLORIDE IN NACL 20-0.9 MEQ/L-% IV SOLN
INTRAVENOUS | Status: DC
  Administered 2013-04-04: 23:00:00 via INTRAVENOUS
  Filled 2013-04-04 (×3): qty 1000

## 2013-04-04 MED ORDER — CEFAZOLIN SODIUM-DEXTROSE 2-3 GM-% IV SOLR
INTRAVENOUS | Status: AC
  Filled 2013-04-04: qty 50

## 2013-04-04 MED ORDER — ONDANSETRON HCL 4 MG/2ML IJ SOLN
INTRAMUSCULAR | Status: AC
  Filled 2013-04-04: qty 2

## 2013-04-04 MED ORDER — SODIUM CHLORIDE 0.9 % IV SOLN: INTRAVENOUS | Status: DC

## 2013-04-04 MED ORDER — LIDOCAINE HCL (CARDIAC) 20 MG/ML IV SOLN
INTRAVENOUS | Status: AC
  Filled 2013-04-04: qty 5

## 2013-04-04 MED ORDER — TRANEXAMIC ACID 100 MG/ML IV SOLN
1000.0000 mg | INTRAVENOUS | Status: AC
  Administered 2013-04-04: 1000 mg via INTRAVENOUS
  Filled 2013-04-04: qty 10

## 2013-04-04 MED ORDER — HYDROMORPHONE HCL PF 1 MG/ML IJ SOLN: 0.2500 mg | INTRAMUSCULAR | Status: DC | PRN

## 2013-04-04 MED ORDER — RIVAROXABAN 10 MG PO TABS
10.0000 mg | ORAL_TABLET | Freq: Every day | ORAL | Status: DC
  Administered 2013-04-05 – 2013-04-07 (×3): 10 mg via ORAL
  Filled 2013-04-04 (×4): qty 1

## 2013-04-04 MED ORDER — INSULIN ASPART 100 UNIT/ML ~~LOC~~ SOLN
0.0000 [IU] | Freq: Three times a day (TID) | SUBCUTANEOUS | Status: DC
  Administered 2013-04-05: 2 [IU] via SUBCUTANEOUS
  Administered 2013-04-05 – 2013-04-06 (×2): 3 [IU] via SUBCUTANEOUS

## 2013-04-04 MED ORDER — ONDANSETRON HCL 4 MG PO TABS: 4.0000 mg | ORAL_TABLET | Freq: Four times a day (QID) | ORAL | Status: DC | PRN

## 2013-04-04 MED ORDER — MENTHOL 3 MG MT LOZG
1.0000 | LOZENGE | OROMUCOSAL | Status: DC | PRN
  Filled 2013-04-04: qty 9

## 2013-04-04 MED ORDER — DEXAMETHASONE SODIUM PHOSPHATE 10 MG/ML IJ SOLN
10.0000 mg | Freq: Once | INTRAMUSCULAR | Status: AC
  Administered 2013-04-04: 10 mg via INTRAVENOUS

## 2013-04-04 MED ORDER — METHOCARBAMOL 500 MG PO TABS
500.0000 mg | ORAL_TABLET | Freq: Four times a day (QID) | ORAL | Status: DC | PRN
  Administered 2013-04-05 – 2013-04-07 (×5): 500 mg via ORAL
  Filled 2013-04-04 (×5): qty 1

## 2013-04-04 MED ORDER — PHENOL 1.4 % MT LIQD: 1.0000 | OROMUCOSAL | Status: DC | PRN

## 2013-04-04 MED ORDER — METOCLOPRAMIDE HCL 10 MG PO TABS
5.0000 mg | ORAL_TABLET | Freq: Three times a day (TID) | ORAL | Status: DC | PRN
  Administered 2013-04-04: 10 mg via ORAL
  Filled 2013-04-04: qty 1

## 2013-04-04 MED ORDER — FLEET ENEMA 7-19 GM/118ML RE ENEM: 1.0000 | ENEMA | Freq: Once | RECTAL | Status: AC | PRN

## 2013-04-04 MED ORDER — BUPIVACAINE LIPOSOME 1.3 % IJ SUSP
INTRAMUSCULAR | Status: DC | PRN
  Administered 2013-04-04: 20 mL

## 2013-04-04 MED ORDER — PHENYLEPHRINE HCL 10 MG/ML IJ SOLN
INTRAMUSCULAR | Status: DC | PRN
  Administered 2013-04-04 (×5): 40 ug via INTRAVENOUS

## 2013-04-04 MED ORDER — LACTATED RINGERS IV SOLN
INTRAVENOUS | Status: DC
  Administered 2013-04-04: 17:00:00 via INTRAVENOUS

## 2013-04-04 MED ORDER — ACETAMINOPHEN 500 MG PO TABS
1000.0000 mg | ORAL_TABLET | Freq: Four times a day (QID) | ORAL | Status: AC
  Administered 2013-04-04 – 2013-04-05 (×4): 1000 mg via ORAL
  Filled 2013-04-04 (×5): qty 2

## 2013-04-04 MED ORDER — DEXTROSE 5 % IV SOLN
500.0000 mg | Freq: Four times a day (QID) | INTRAVENOUS | Status: DC | PRN
  Administered 2013-04-04: 500 mg via INTRAVENOUS
  Filled 2013-04-04: qty 5

## 2013-04-04 MED ORDER — FENTANYL CITRATE 0.05 MG/ML IJ SOLN
INTRAMUSCULAR | Status: AC
  Filled 2013-04-04: qty 2

## 2013-04-04 MED ORDER — PROPOFOL INFUSION 10 MG/ML OPTIME
INTRAVENOUS | Status: DC | PRN
  Administered 2013-04-04: 140 ug/kg/min via INTRAVENOUS

## 2013-04-04 MED ORDER — CHLORHEXIDINE GLUCONATE 4 % EX LIQD: 60.0000 mL | Freq: Once | CUTANEOUS | Status: DC

## 2013-04-04 MED ORDER — BUPIVACAINE HCL 0.25 % IJ SOLN
INTRAMUSCULAR | Status: DC | PRN
  Administered 2013-04-04: 20 mL

## 2013-04-04 SURGICAL SUPPLY — 53 items
BAG SPEC THK2 15X12 ZIP CLS (MISCELLANEOUS)
BAG ZIPLOCK 12X15 (MISCELLANEOUS) IMPLANT
BIT DRILL 2.8X128 (BIT) ×2 IMPLANT
BLADE EXTENDED COATED 6.5IN (ELECTRODE) ×2 IMPLANT
BLADE SAW SAG 73X25 THK (BLADE) ×1
BLADE SAW SGTL 73X25 THK (BLADE) ×1 IMPLANT
CAPT HIP PF COP ×1 IMPLANT
DECANTER SPIKE VIAL GLASS SM (MISCELLANEOUS) ×2 IMPLANT
DRAPE INCISE IOBAN 66X45 STRL (DRAPES) ×2 IMPLANT
DRAPE ORTHO SPLIT 77X108 STRL (DRAPES) ×4
DRAPE POUCH INSTRU U-SHP 10X18 (DRAPES) ×2 IMPLANT
DRAPE SURG ORHT 6 SPLT 77X108 (DRAPES) ×2 IMPLANT
DRAPE U-SHAPE 47X51 STRL (DRAPES) ×2 IMPLANT
DRSG ADAPTIC 3X8 NADH LF (GAUZE/BANDAGES/DRESSINGS) ×2 IMPLANT
DRSG EMULSION OIL 3X16 NADH (GAUZE/BANDAGES/DRESSINGS) ×1 IMPLANT
DRSG MEPILEX BORDER 4X12 (GAUZE/BANDAGES/DRESSINGS) ×1 IMPLANT
DRSG MEPILEX BORDER 4X4 (GAUZE/BANDAGES/DRESSINGS) ×2 IMPLANT
DRSG MEPILEX BORDER 4X8 (GAUZE/BANDAGES/DRESSINGS) ×2 IMPLANT
DURAPREP 26ML APPLICATOR (WOUND CARE) ×2 IMPLANT
ELECT REM PT RETURN 9FT ADLT (ELECTROSURGICAL) ×2
ELECTRODE REM PT RTRN 9FT ADLT (ELECTROSURGICAL) ×1 IMPLANT
EVACUATOR 1/8 PVC DRAIN (DRAIN) ×2 IMPLANT
FACESHIELD LNG OPTICON STERILE (SAFETY) ×8 IMPLANT
GLOVE BIO SURGEON STRL SZ7.5 (GLOVE) IMPLANT
GLOVE BIO SURGEON STRL SZ8 (GLOVE) ×2 IMPLANT
GLOVE BIOGEL PI IND STRL 8 (GLOVE) ×1 IMPLANT
GLOVE BIOGEL PI INDICATOR 8 (GLOVE) ×1
GLOVE SURG SS PI 6.5 STRL IVOR (GLOVE) IMPLANT
GOWN STRL REUS W/TWL LRG LVL3 (GOWN DISPOSABLE) ×2 IMPLANT
GOWN STRL REUS W/TWL XL LVL3 (GOWN DISPOSABLE) IMPLANT
IMMOBILIZER KNEE 20 (SOFTGOODS) ×2 IMPLANT
KIT BASIN OR (CUSTOM PROCEDURE TRAY) ×2 IMPLANT
MANIFOLD NEPTUNE II (INSTRUMENTS) ×2 IMPLANT
NDL SAFETY ECLIPSE 18X1.5 (NEEDLE) ×2 IMPLANT
NEEDLE HYPO 18GX1.5 SHARP (NEEDLE) ×4
NS IRRIG 1000ML POUR BTL (IV SOLUTION) ×2 IMPLANT
PACK TOTAL JOINT (CUSTOM PROCEDURE TRAY) ×2 IMPLANT
PASSER SUT SWANSON 36MM LOOP (INSTRUMENTS) ×2 IMPLANT
POSITIONER SURGICAL ARM (MISCELLANEOUS) ×2 IMPLANT
SPONGE GAUZE 4X4 12PLY (GAUZE/BANDAGES/DRESSINGS) ×2 IMPLANT
STRIP CLOSURE SKIN 1/2X4 (GAUZE/BANDAGES/DRESSINGS) ×4 IMPLANT
SUT ETHIBOND NAB CT1 #1 30IN (SUTURE) ×4 IMPLANT
SUT MNCRL AB 4-0 PS2 18 (SUTURE) ×2 IMPLANT
SUT VIC AB 2-0 CT1 27 (SUTURE) ×6
SUT VIC AB 2-0 CT1 TAPERPNT 27 (SUTURE) ×3 IMPLANT
SUT VLOC 180 0 24IN GS25 (SUTURE) ×2 IMPLANT
SYR 20CC LL (SYRINGE) ×2 IMPLANT
SYR 50ML LL SCALE MARK (SYRINGE) ×2 IMPLANT
TAPE STRIPS DRAPE STRL (GAUZE/BANDAGES/DRESSINGS) ×1 IMPLANT
TOWEL OR 17X26 10 PK STRL BLUE (TOWEL DISPOSABLE) ×4 IMPLANT
TOWEL OR NON WOVEN STRL DISP B (DISPOSABLE) ×2 IMPLANT
TRAY FOLEY CATH 14FRSI W/METER (CATHETERS) ×2 IMPLANT
WATER STERILE IRR 1500ML POUR (IV SOLUTION) ×2 IMPLANT

## 2013-04-04 NOTE — Progress Notes (Signed)
Dr. Leta JunglingEwell made aware of patient's CBG results in PACU- 79

## 2013-04-04 NOTE — Op Note (Signed)
Pre-operative diagnosis- Osteoarthritis Right hip  Post-operative diagnosis- Osteoarthritis  Right hip  Procedure-  RightTotal Hip Arthroplasty  Surgeon- Gus RankinFrank V. Ural Acree, MD  Assistant- Avel Peacerew Perkins, PA-C   Anesthesia  Spinal  EBL- 225 ml   Drain Hemovac   Complication- None  Condition-PACU - hemodynamically stable.   Brief Clinical Note-  Tami Pearson is a 48 y.o. female with end stage arthritis of her right hip with progressively worsening pain and dysfunction. Pain occurs with activity and rest including pain at night. She has tried analgesics, protected weight bearing and rest without benefit. Pain is too severe to attempt physical therapy. Radiographs demonstrate bone on bone arthritis with subchondral cyst formation. She presents now for right THA.  Procedure in detail-   The patient is brought into the operating room and placed on the operating table. After successful administration of Spinal  anesthesia, the patient is placed in the  Left lateral decubitus position with the  Right side up and held in place with the hip positioner. The lower extremity is isolated from the perineum with plastic drapes and time-out is performed by the surgical team. The lower extremity is then prepped and draped in the usual sterile fashion. A short posterolateral incision is made with a ten blade through the subcutaneous tissue to the level of the fascia lata which is incised in line with the skin incision. The sciatic nerve is palpated and protected and the short external rotators and capsule are isolated from the femur. The hip is then dislocated and the center of the femoral head is marked. A trial prosthesis is placed such that the trial head corresponds to the center of the patients' native femoral head. The resection level is marked on the femoral neck and the resection is made with an oscillating saw. The femoral head is removed and femoral retractors placed to gain access to the femoral  canal.      The canal finder is passed into the femoral canal and the canal is thoroughly irrigated with sterile saline to remove the fatty contents. Axial reaming is performed to 11.5  mm, proximal reaming to 16 D  and the sleeve machined to a small. A 16 D small trial sleeve is placed into the proximal femur.      The femur is then retracted anteriorly to gain acetabular exposure. Acetabular retractors are placed and the labrum and osteophytes are removed, Acetabular reaming is performed to 47  mm and a 48  mm Pinnacle acetabular shell is placed in anatomic position with excellent purchase. Additional dome screws were not needed. The permanent 28 mm neutral + 4 Marathon liner is placed into the acetabular shell.      The trial femur is then placed into the femoral canal. The size is 16 x 11  stem with a 36 + 6  neck and a 28 + 6 head with the neck version matching  the patients' native anteversion. The hip is reduced with excellent stability with full extension and full external rotation, 70 degrees flexion with 40 degrees adduction and 90 degrees internal rotation and 90 degrees of flexion with 70 degrees of internal rotation. The operative leg is placed on top of the non-operative leg and the leg lengths are found to be equal. The trials are then removed and the permanent implant of the same size is impacted into the femoral canal. The 28 + 6 ceramic femoral head of the same size as the trial is placed and the hip is  reduced with the same stability parameters. The operative leg is again placed on top of the non-operative leg and the leg lengths are found to be equal.      The wound is then copiously irrigated with saline solution and the capsule and short external rotators are re-attached to the femur through drill holes with Ethibond suture. The fascia lata is closed over a hemovac drain with #1 vicryl suture and the fascia lata, gluteal muscles and subcutaneous tissues are injected with Exparel 20ml  diluted with saline 30 ml plus 20 ml of .25% Marcaine. The subcutaneous tissues are closed with #1 and2-0 vicryl and the subcuticular layer closed with running 4-0 Monocryl. The drain is hooked to suction, incision cleaned and dried, and steri-srips and a bulky sterile dressing applied. The limb is placed into a knee immobilizer and the patient is awakened and transported to recovery in stable condition.      Please note that a surgical assistant was a medical necessity for this procedure in order to perform it in a safe and expeditious manner. The assistant was necessary to provide retraction to the vital neurovascular structures and to retract and position the limb to allow for anatomic placement of the prosthetic components.  Gus Rankin Chelsei Mcchesney, MD    04/04/2013, 3:50 PM

## 2013-04-04 NOTE — Transfer of Care (Signed)
Immediate Anesthesia Transfer of Care Note  Patient: Tami Pearson  Procedure(s) Performed: Procedure(s): RIGHT TOTAL HIP ARTHROPLASTY (Right)  Patient Location: PACU  Anesthesia Type:Spinal  Level of Consciousness: awake, oriented, patient cooperative, lethargic and responds to stimulation  Airway & Oxygen Therapy: Patient Spontanous Breathing and Patient connected to face mask oxygen  Post-op Assessment: Report given to PACU RN and Post -op Vital signs reviewed and stable  Post vital signs: Reviewed and stable  Complications: No apparent anesthesia complications

## 2013-04-04 NOTE — Anesthesia Procedure Notes (Signed)
Spinal  Patient location during procedure: OR Start time: 04/04/2013 2:31 PM Staffing CRNA/Resident: Dion Saucier E Performed by: resident/CRNA  Preanesthetic Checklist Completed: patient identified, site marked, surgical consent, pre-op evaluation, timeout performed, IV checked, risks and benefits discussed and monitors and equipment checked Spinal Block Patient position: sitting Prep: Betadine Patient monitoring: heart rate, continuous pulse ox and blood pressure Approach: midline Location: L3-4 Injection technique: single-shot Needle Needle type: Whitacre (introducer)  Needle gauge: 25 G Needle length: 9 cm Additional Notes Kit expiration date checked, clear csf,  negative paresthesias, heme.  tolerated well.

## 2013-04-04 NOTE — Progress Notes (Signed)
Portable AP Pelvis X-RAY done. 

## 2013-04-04 NOTE — Progress Notes (Signed)
X-ray results noted 

## 2013-04-04 NOTE — Anesthesia Preprocedure Evaluation (Addendum)
Anesthesia Evaluation  Patient identified by MRN, date of birth, ID band Patient awake    Reviewed: Allergy & Precautions, H&P , NPO status , Patient's Chart, lab work & pertinent test results  Airway Mallampati: II TM Distance: >3 FB Neck ROM: full    Dental no notable dental hx. (+) Teeth Intact, Dental Advisory Given   Pulmonary neg pulmonary ROS,  breath sounds clear to auscultation  Pulmonary exam normal       Cardiovascular hypertension, Pt. on medications Rhythm:regular Rate:Normal     Neuro/Psych negative neurological ROS  negative psych ROS   GI/Hepatic negative GI ROS, Neg liver ROS,   Endo/Other  diabetes, Well Controlled, Type 2, Oral Hypoglycemic AgentsMorbid obesity  Renal/GU negative Renal ROS  negative genitourinary   Musculoskeletal   Abdominal (+) + obese,   Peds  Hematology negative hematology ROS (+)   Anesthesia Other Findings   Reproductive/Obstetrics negative OB ROS                         Anesthesia Physical Anesthesia Plan  ASA: III  Anesthesia Plan: Spinal   Post-op Pain Management:    Induction:   Airway Management Planned: Simple Face Mask  Additional Equipment:   Intra-op Plan:   Post-operative Plan: Extubation in OR  Informed Consent: I have reviewed the patients History and Physical, chart, labs and discussed the procedure including the risks, benefits and alternatives for the proposed anesthesia with the patient or authorized representative who has indicated his/her understanding and acceptance.   Dental Advisory Given  Plan Discussed with: CRNA and Surgeon  Anesthesia Plan Comments:        Anesthesia Quick Evaluation

## 2013-04-04 NOTE — Interval H&P Note (Signed)
History and Physical Interval Note:  04/04/2013 1:43 PM  Tami Pearson  has presented today for surgery, with the diagnosis of OSTEOARTHRITIS RIGHT HIP  The various methods of treatment have been discussed with the patient and family. After consideration of risks, benefits and other options for treatment, the patient has consented to  Procedure(s): RIGHT TOTAL HIP ARTHROPLASTY (Right) as a surgical intervention .  The patient's history has been reviewed, patient examined, no change in status, stable for surgery.  I have reviewed the patient's chart and labs.  Questions were answered to the patient's satisfaction.     Loanne DrillingALUISIO,Stephania Macfarlane V

## 2013-04-04 NOTE — Anesthesia Postprocedure Evaluation (Signed)
  Anesthesia Post-op Note  Patient: Tami Pearson  Procedure(s) Performed: Procedure(s) (LRB): RIGHT TOTAL HIP ARTHROPLASTY (Right)  Patient Location: PACU  Anesthesia Type: Spinal  Level of Consciousness: awake and alert   Airway and Oxygen Therapy: Patient Spontanous Breathing  Post-op Pain: mild  Post-op Assessment: Post-op Vital signs reviewed, Patient's Cardiovascular Status Stable, Respiratory Function Stable, Patent Airway and No signs of Nausea or vomiting  Last Vitals:  Filed Vitals:   04/04/13 1645  BP: 97/72  Pulse: 63  Temp:   Resp: 15    Post-op Vital Signs: stable   Complications: No apparent anesthesia complications

## 2013-04-04 NOTE — Preoperative (Signed)
Beta Blockers   Reason not to administer Beta Blockers:Not Applicable 

## 2013-04-04 NOTE — Progress Notes (Signed)
O.K. To go to floor with spinal level of L3- per Dr. Leta JunglingEwell

## 2013-04-05 ENCOUNTER — Encounter (HOSPITAL_COMMUNITY): Payer: Self-pay | Admitting: Orthopedic Surgery

## 2013-04-05 LAB — BASIC METABOLIC PANEL
BUN: 24 mg/dL — AB (ref 6–23)
CHLORIDE: 101 meq/L (ref 96–112)
CO2: 24 mEq/L (ref 19–32)
Calcium: 8.9 mg/dL (ref 8.4–10.5)
Creatinine, Ser: 0.99 mg/dL (ref 0.50–1.10)
GFR calc non Af Amer: 66 mL/min — ABNORMAL LOW (ref >90)
GFR, EST AFRICAN AMERICAN: 77 mL/min — AB (ref >90)
Glucose, Bld: 165 mg/dL — ABNORMAL HIGH (ref 70–99)
Potassium: 3.9 mEq/L (ref 3.7–5.3)
Sodium: 139 mEq/L (ref 137–147)

## 2013-04-05 LAB — GLUCOSE, CAPILLARY
GLUCOSE-CAPILLARY: 117 mg/dL — AB (ref 70–99)
Glucose-Capillary: 127 mg/dL — ABNORMAL HIGH (ref 70–99)
Glucose-Capillary: 179 mg/dL — ABNORMAL HIGH (ref 70–99)
Glucose-Capillary: 185 mg/dL — ABNORMAL HIGH (ref 70–99)

## 2013-04-05 LAB — CBC
HCT: 32.5 % — ABNORMAL LOW (ref 36.0–46.0)
Hemoglobin: 10.8 g/dL — ABNORMAL LOW (ref 12.0–15.0)
MCH: 28.7 pg (ref 26.0–34.0)
MCHC: 33.2 g/dL (ref 30.0–36.0)
MCV: 86.4 fL (ref 78.0–100.0)
Platelets: 297 10*3/uL (ref 150–400)
RBC: 3.76 MIL/uL — ABNORMAL LOW (ref 3.87–5.11)
RDW: 14.4 % (ref 11.5–15.5)
WBC: 15.4 10*3/uL — ABNORMAL HIGH (ref 4.0–10.5)

## 2013-04-05 NOTE — Progress Notes (Signed)
   Subjective: 1 Day Post-Op Procedure(s) (LRB): RIGHT TOTAL HIP ARTHROPLASTY (Right) Patient reports pain as mild.   Patient seen in rounds with Dr. Lequita HaltAluisio.  Sitting up in bed. Patient is well, and has had no acute complaints or problems We will start therapy today.  Plan is to go Home after hospital stay.  Objective: Vital signs in last 24 hours: Temp:  [97.3 F (36.3 C)-98.9 F (37.2 C)] 98.4 F (36.9 C) (03/05 0440) Pulse Rate:  [60-91] 91 (03/05 0440) Resp:  [13-19] 16 (03/05 0440) BP: (89-142)/(57-99) 103/70 mmHg (03/05 0440) SpO2:  [95 %-100 %] 95 % (03/05 0440) Weight:  [109.77 kg (242 lb)] 109.77 kg (242 lb) (03/04 1805)  Intake/Output from previous day:  Intake/Output Summary (Last 24 hours) at 04/05/13 0909 Last data filed at 04/05/13 0844  Gross per 24 hour  Intake 4357.92 ml  Output   1330 ml  Net 3027.92 ml    Intake/Output this shift: Total I/O In: 120 [P.O.:120] Out: -   Labs:  Recent Labs  04/03/13 1520 04/05/13 0503  HGB 12.8 10.8*    Recent Labs  04/03/13 1520 04/05/13 0503  WBC 7.9 15.4*  RBC 4.47 3.76*  HCT 39.2 32.5*  PLT 351 297    Recent Labs  04/03/13 1520 04/05/13 0503  NA 140 139  K 3.9 3.9  CL 100 101  CO2 27 24  BUN 25* 24*  CREATININE 1.06 0.99  GLUCOSE 98 165*  CALCIUM 9.6 8.9    Recent Labs  04/03/13 1520  INR 0.98    EXAM General - Patient is Alert, Appropriate and Oriented Extremity - Neurovascular intact Sensation intact distally Dorsiflexion/Plantar flexion intact Dressing - dressing C/D/I Motor Function - intact, moving foot and toes well on exam.  Hemovac came out without difficulty some time during the night.  Past Medical History  Diagnosis Date  . Hypertension   . Arthritis 2010 approx    bilateral hip and knee pain  . Diabetes mellitus without complication     Assessment/Plan: 1 Day Post-Op Procedure(s) (LRB): RIGHT TOTAL HIP ARTHROPLASTY (Right) Active Problems:   OA  (osteoarthritis) of hip  Estimated body mass index is 42.88 kg/(m^2) as calculated from the following:   Height as of this encounter: 5\' 3"  (1.6 m).   Weight as of this encounter: 109.77 kg (242 lb). Advance diet Up with therapy Plan for discharge tomorrow Discharge home with home health  DVT Prophylaxis - Xarelto Weight Bearing As Tolerated right Leg D/C Knee Immobilizer Hemovac Pulled Begin Therapy Hip Preacutions  PERKINS, ALEXZANDREW 04/05/2013, 9:09 AM

## 2013-04-05 NOTE — Progress Notes (Signed)
Physical Therapy Treatment Patient Details Name: Tami Pearson MRN: [id number] DOB: 06/12/1965 Today's Date: 04/05/2013 Time: [id number] PT Time Calculation (min): 23 min  PT Assessment / Plan / Recommendation  History of Present Illness     PT Comments   Progressing well  Follow Up Recommendations  Home health PT     Does the patient have the potential to tolerate intense rehabilitation     Barriers to Discharge        Equipment Recommendations  None recommended by PT    Recommendations for Other Services OT consult  Frequency 7X/week   Progress towards PT Goals    Plan Current plan remains appropriate    Precautions / Restrictions Precautions Precautions: Posterior Hip;Fall Precaution Comments: Pt recalls all THP without cues Restrictions Weight Bearing Restrictions: No Other Position/Activity Restrictions: WBAT   Pertinent Vitals/Pain 4/10; premed, ice pack provided    Mobility  Bed Mobility Overal bed mobility: Needs Assistance Bed Mobility: Sit to Supine Sit to supine: Min assist;Mod assist General bed mobility comments: cues for sequencing and use of L LE to self assist Transfers Overall transfer level: Needs assistance Equipment used: Rolling walker (2 wheeled) Transfers: Sit to/from Stand Sit to Stand: Min assist General transfer comment: cues for LE management and use of UEs to self assist Ambulation/Gait Ambulation/Gait assistance: Min assist;Min guard Ambulation Distance (Feet): 75 Feet (twice) Assistive device: Rolling walker (2 wheeled) Gait Pattern/deviations: Step-to pattern Gait velocity: decr General Gait Details: cues for posture, sequence and position from RW    Exercises     PT Diagnosis:    PT Problem List:   PT Treatment Interventions:     PT Goals (current goals can now be found in the care plan section) Acute Rehab PT Goals Patient Stated Goal: Walk without a walker  PT Goal Formulation: With patient Time For Goal  Achievement: 04/12/13 Potential to Achieve Goals: Good  Visit Information  Last PT Received On: 04/05/13 Assistance Needed: +1    Subjective Data  Patient Stated Goal: Walk without a walker    Cognition  Cognition Arousal/Alertness: Awake/alert Behavior During Therapy: WFL for tasks assessed/performed Overall Cognitive Status: Within Functional Limits for tasks assessed    Balance     End of Session PT - End of Session Equipment Utilized During Treatment: Gait belt Activity Tolerance: Patient tolerated treatment well Patient left: in bed;with call bell/phone within reach Nurse Communication: Mobility status   GP     Mckay Brandt 04/05/2013, 3:44 PM

## 2013-04-05 NOTE — Progress Notes (Signed)
   CARE MANAGEMENT NOTE 04/05/2013  Patient:  Rica RecordsLLISON,Sabriya L   Account Number:  [id number]  Date Initiated:  04/05/2013  Documentation initiated by:  [facility],Cheron Coryell  Subjective/Objective Assessment:   RIGHT TOTAL HIP ARTHROPLASTY     Action/Plan:   lives at home with husband   Anticipated DC Date:  04/07/2013   Anticipated DC Plan:  HOME W HOME HEALTH SERVICES      DC Planning Services  CM consult      [facility]   Choice offered to / List presented to:  C-1 Patient        HH arranged  HH-2 PT      [facility]   Status of service:  Completed, signed off Medicare Important Message given?   (If response is "NO", the following Medicare IM given date fields will be blank) Date Medicare IM given:   Date Additional Medicare IM given:    Discharge Disposition:  HOME W HOME HEALTH SERVICES  Per UR Regulation:    If discussed at Long Length of Stay Meetings, dates discussed:    Comments:  04/05/2013 1045 NCM spoke to pt and [facility] states she [facility] in the past. She has RW and 3n1 at home. Isidoro DonningAlesia Kaaliyah Kita RN CCM Case Mgmt phone [phone]

## 2013-04-05 NOTE — Progress Notes (Signed)
OT Cancellation Note  Patient Details Name: Tami Pearson MRN: [id number] DOB: 01/12/1966   Cancelled Treatment:    Reason Eval/Treat Not Completed: OT screened, no needs identified, will sign off. Pt states she had opposite hip done in Oct 2014 and has all DME and help at d/c. She declines any OT needs currently.  Lennox LaityStone, Christel Bai Stafford [phone] 04/05/2013, 11:44 AM

## 2013-04-05 NOTE — Plan of Care (Signed)
Problem: Consults Goal: Diagnosis- Total Joint Replacement Outcome: Completed/Met Date Met:  04/05/13 Primary Total Hip RIGHT, Anterior

## 2013-04-05 NOTE — Discharge Instructions (Addendum)
Dr. Gaynelle Arabian Total Joint Specialist [facility] [address], [place], [place] [address] [phone]   TOTAL HIP REPLACEMENT POSTOPERATIVE DIRECTIONS    Hip Rehabilitation, Guidelines Following Surgery  The results of a hip operation are greatly improved after range of motion and muscle strengthening exercises. Follow all safety measures which are given to protect your hip. If any of these exercises cause increased pain or swelling in your joint, decrease the amount until you are comfortable again. Then slowly increase the exercises. Call your caregiver if you have problems or questions.  HOME CARE INSTRUCTIONS  Most of the following instructions are designed to prevent the dislocation of your new hip.  Remove items at home which could result in a fall. This includes throw rugs or furniture in walking pathways.  Continue medications as instructed at time of discharge.  You may have some home medications which will be placed on hold until you complete the course of blood thinner medication.  You may start showering once you are discharged home but do not submerge the incision under water. Just pat the incision dry and apply a dry gauze dressing on daily. Do not put on socks or shoes without following the instructions of your caregivers.  Sit on high chairs so your hips are not bent more than 90 degrees.  Sit on chairs with arms. Use the chair arms to help push yourself up when arising.  Keep your leg on the side of the operation out in front of you when standing up.  Arrange for the use of a toilet seat elevator so you are not sitting low.  Do not do any exercises or get in any positions that cause your toes to point in (pigeon toed).  Always sleep with a pillow between your legs. Do not lie on your side in sleep with both knees touching the bed.   Walk with walker as instructed.  You may resume a sexual relationship in one month or when given the OK by  your caregiver.  Use walker as long as suggested by your caregivers.  You may put full weight on your legs and walk as much as is comfortable. Avoid periods of inactivity such as sitting longer than an hour when not asleep. This helps prevent blood clots.  You may return to work once you are cleared by [employer], [employer].  Do not drive a car for 6 weeks or until released by your surgeon.  Do not drive while taking narcotics.  Wear elastic stockings for three weeks following surgery during the day but you may remove then at night.  Make sure you keep all of your appointments after your operation with all of your doctors and caregivers. You should call the office at the above phone number and make an appointment for approximately two weeks after the date of your surgery. Change the dressing daily and reapply a dry dressing each time. Please pick up a stool softener and laxative for home use as long as you are requiring pain medications.  Continue to use ice on the hip for pain and swelling from surgery. You may notice swelling that will progress down to the foot and ankle.  This is normal after  surgery.  Elevate the leg when you are not up walking on it.   It is important for you to complete the blood thinner medication as prescribed by your doctor.  Continue to use the breathing machine which will help keep your temperature down.  It  is common for your temperature to cycle up and down following surgery, especially at night when you are not up moving around and exerting yourself.  The breathing machine keeps your lungs expanded and your temperature down. ° °RANGE OF MOTION AND STRENGTHENING EXERCISES  °These exercises are designed to help you keep full movement of your hip joint. Follow your caregiver's or physical therapist's instructions. Perform all exercises about fifteen times, three times per day or as directed. Exercise both hips, even if you have had only one joint replacement. These exercises can be  done on a training (exercise) mat, on the floor, on a table or on a bed. Use whatever works the best and is most comfortable for you. Use music or television while you are exercising so that the exercises are a pleasant break in your day. This will make your life better with the exercises acting as a break in routine you can look forward to.  °Lying on your back, slowly slide your foot toward your buttocks, raising your knee up off the floor. Then slowly slide your foot back down until your leg is straight again.  °Lying on your back spread your legs as far apart as you can without causing discomfort.  °Lying on your side, raise your upper leg and foot straight up from the floor as far as is comfortable. Slowly lower the leg and repeat.  °Lying on your back, tighten up the muscle in the front of your thigh (quadriceps muscles). You can do this by keeping your leg straight and trying to raise your heel off the floor. This helps strengthen the largest muscle supporting your knee.  °Lying on your back, tighten up the muscles of your buttocks both with the legs straight and with the knee bent at a comfortable angle while keeping your heel on the floor.  ° °SKILLED REHAB INSTRUCTIONS: °If the patient is transferred to a skilled rehab facility following release from the hospital, a list of the current medications will be sent to the facility for the patient to continue.  When discharged from the skilled rehab facility, please have the facility set up the patient's Home Health Physical Therapy prior to being released. Also, the skilled facility will be responsible for providing the patient with their medications at time of release from the facility to include their pain medication, the muscle relaxants, and their blood thinner medication. If the patient is still at the rehab facility at time of the two week follow up appointment, the skilled rehab facility will also need to assist the patient in arranging follow up  appointment in our office and any transportation needs. ° °MAKE SURE YOU:  °Understand these instructions.  °Will watch your condition.  °Will get help right away if you are not doing well or get worse. ° °Pick up stool softner and laxative for home. °Do not submerge incision under water. °May shower. °Continue to use ice for pain and swelling from surgery. °Hip precautions.  °Total Hip Protocol. ° °Take Xarelto for two and a half more weeks, then discontinue Xarelto. °Once the patient has completed the blood thinner regimen, then take a Baby 81 mg Aspirin daily for four more weeks. ° ° ° °Information on my medicine - XARELTO® (Rivaroxaban) ° °This medication education was reviewed with me or my healthcare representative as part of my discharge preparation.  The pharmacist that spoke with me during my hospital stay was:  Absher, Randall K, RPH ° °Why was Xarelto® prescribed for you? °Xarelto®   prescribed for you to reduce the risk of blood clots forming after orthopedic surgery. The medical term for these abnormal blood clots is venous thromboembolism (VTE).  What do you need to know about xarelto ? Take your Xarelto ONCE DAILY at the same time every day. You may take it either with or without food.  If you have difficulty swallowing the tablet whole, you may crush it and mix in applesauce just prior to taking your dose.  Take Xarelto exactly as prescribed by your doctor and DO NOT stop taking Xarelto without talking to the doctor who prescribed the medication.  Stopping without other VTE prevention medication to take the place of Xarelto may increase your risk of developing a clot.  After discharge, you should have regular check-up appointments with your healthcare provider that is prescribing your Xarelto.    What do you do if you miss a dose? If you miss a dose, take it as soon as you remember on the same day then continue your regularly scheduled once daily regimen the next day. Do not take  two doses of Xarelto on the same day.   Important Safety Information A possible side effect of Xarelto is bleeding. You should call your healthcare provider right away if you experience any of the following:   Bleeding from an injury or your nose that does not stop.   Unusual colored urine (red or dark brown) or unusual colored stools (red or black).   Unusual bruising for unknown reasons.   A serious fall or if you hit your head (even if there is no bleeding).  Some medicines may interact with Xarelto and might increase your risk of bleeding while on Xarelto. To help avoid this, consult your healthcare provider or pharmacist prior to using any new prescription or non-prescription medications, including herbals, vitamins, non-steroidal anti-inflammatory drugs (NSAIDs) and supplements.  This website has more information on Xarelto: [email or web address]

## 2013-04-05 NOTE — Evaluation (Signed)
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  Pt should progress to d/c home with family assist and HHPT follow up.    PT Assessment  Patient needs continued PT services    Follow Up Recommendations  Home health PT    Does the patient have the potential to tolerate intense rehabilitation      Barriers to Discharge        Equipment Recommendations  None recommended by PT    Recommendations for Other Services OT consult   Frequency 7X/week    Precautions / Restrictions Precautions Precautions: Posterior Hip;Fall Restrictions Weight Bearing Restrictions: No Other Position/Activity Restrictions: WBAT   Pertinent Vitals/Pain 4/10; premed, ice pack provided      Mobility  Bed Mobility Overal bed mobility: Needs Assistance Bed Mobility: Supine to Sit Supine to sit: Min assist General bed mobility comments: cues for sequencing and use of L LE to self assist Transfers Overall transfer level: Needs assistance Equipment used: Rolling walker (2 wheeled) Transfers: Sit to/from Stand Sit to Stand: Min assist General transfer comment: cues for LE management and use of UEs to self assist Ambulation/Gait Ambulation/Gait assistance: Min assist Ambulation Distance (Feet): 68 Feet Assistive device: Rolling walker (2 wheeled) Gait Pattern/deviations: Step-to pattern;Decreased step length - right;Decreased step length - left;Shuffle;Trunk flexed General Gait Details: cues for posture, sequence and position from RW    Exercises Total Joint Exercises Ankle Circles/Pumps: AROM;Both;10 reps;Supine Quad Sets: AROM;Both;10 reps;Supine Heel Slides:  AAROM;Right;15 reps;Supine Hip ABduction/ADduction: AAROM;Right;10 reps;Supine   PT Diagnosis: Difficulty walking  PT Problem List: Decreased strength;Decreased range of motion;Decreased activity tolerance;Decreased mobility;Decreased knowledge of use of DME;Pain;Obesity;Decreased knowledge of precautions PT Treatment Interventions: DME instruction;Gait training;Stair training;Functional mobility training;Therapeutic activities;Therapeutic exercise;Patient/family education     PT Goals(Current goals can be found in the care plan section) Acute Rehab PT Goals Patient Stated Goal: Walk without a walker  PT Goal Formulation: With patient Time For Goal Achievement: 04/12/13 Potential to Achieve Goals: Good  Visit Information  Last PT Received On: 04/05/13 Assistance Needed: +1       Prior Functioning  Home Living Family/patient expects to be discharged to:: Private residence Living Arrangements: Spouse/significant other Available Help at Discharge: Family;Available 24 hours/day Type of Home: House Home Access: Stairs to enter Entergy CorporationEntrance Stairs-Number of Steps: 1 Entrance Stairs-Rails: None Home Layout: One level Home Equipment: Bedside commode;Walker - 2 wheels Prior Function Level of Independence: Independent with assistive device(s) Comments: has been using RW since previous THR Communication Communication: No difficulties Dominant Hand: Right    Cognition  Cognition Arousal/Alertness: Awake/alert Behavior During Therapy: WFL for tasks assessed/performed Overall Cognitive Status: Within Functional Limits for tasks assessed    Extremity/Trunk Assessment Upper Extremity Assessment Upper Extremity Assessment: Overall WFL for tasks assessed Lower Extremity Assessment Lower Extremity Assessment: RLE deficits/detail RLE Deficits / Details: 2/5 hip strength with AAROM at hip to 85 flex and 20 abd Cervical / Trunk Assessment Cervical / Trunk Assessment: Normal   Balance     End of Session PT - End of Session Equipment Utilized During Treatment: Gait belt Activity Tolerance: Patient tolerated treatment well Patient left: in chair;with call bell/phone within reach Nurse Communication: Mobility status  GP  Teion Ballin 04/05/2013, 12:50 PM

## 2013-04-06 LAB — BASIC METABOLIC PANEL
BUN: 20 mg/dL (ref 6–23)
CO2: 27 meq/L (ref 19–32)
Calcium: 9.3 mg/dL (ref 8.4–10.5)
Chloride: 100 mEq/L (ref 96–112)
Creatinine, Ser: 0.96 mg/dL (ref 0.50–1.10)
GFR calc Af Amer: 80 mL/min — ABNORMAL LOW (ref >90)
GFR, EST NON AFRICAN AMERICAN: 69 mL/min — AB (ref >90)
GLUCOSE: 131 mg/dL — AB (ref 70–99)
POTASSIUM: 3.9 meq/L (ref 3.7–5.3)
Sodium: 138 mEq/L (ref 137–147)

## 2013-04-06 LAB — CBC
HEMATOCRIT: 31.1 % — AB (ref 36.0–46.0)
HEMOGLOBIN: 10.2 g/dL — AB (ref 12.0–15.0)
MCH: 28.3 pg (ref 26.0–34.0)
MCHC: 32.8 g/dL (ref 30.0–36.0)
MCV: 86.1 fL (ref 78.0–100.0)
Platelets: 281 10*3/uL (ref 150–400)
RBC: 3.61 MIL/uL — ABNORMAL LOW (ref 3.87–5.11)
RDW: 14.6 % (ref 11.5–15.5)
WBC: 15.7 10*3/uL — ABNORMAL HIGH (ref 4.0–10.5)

## 2013-04-06 LAB — GLUCOSE, CAPILLARY
GLUCOSE-CAPILLARY: 114 mg/dL — AB (ref 70–99)
Glucose-Capillary: 129 mg/dL — ABNORMAL HIGH (ref 70–99)
Glucose-Capillary: 174 mg/dL — ABNORMAL HIGH (ref 70–99)
Glucose-Capillary: 131 mg/dL — ABNORMAL HIGH (ref 70–99)

## 2013-04-06 MED ORDER — TRAMADOL HCL 50 MG PO TABS: 50.0000 mg | ORAL_TABLET | Freq: Four times a day (QID) | ORAL | Status: DC | PRN

## 2013-04-06 MED ORDER — RIVAROXABAN 10 MG PO TABS: 10.0000 mg | ORAL_TABLET | Freq: Every day | ORAL | Status: DC

## 2013-04-06 MED ORDER — OXYCODONE HCL 5 MG PO TABS: 5.0000 mg | ORAL_TABLET | ORAL | Status: DC | PRN

## 2013-04-06 MED ORDER — METHOCARBAMOL 500 MG PO TABS: 500.0000 mg | ORAL_TABLET | Freq: Four times a day (QID) | ORAL | Status: DC | PRN

## 2013-04-06 NOTE — Progress Notes (Signed)
Utilization review completed.  

## 2013-04-06 NOTE — Progress Notes (Signed)
Physical Therapy Treatment Patient Details Name: Tami Pearson MRN: [id number] DOB: 10/03/1965 Today's Date: 04/06/2013 Time: [id number] PT Time Calculation (min): 25 min  PT Assessment / Plan / Recommendation  History of Present Illness     PT Comments   Pt continues cooperative and willing to participate but ltd by c/o pain and fatigue.    Follow Up Recommendations  Home health PT     Does the patient have the potential to tolerate intense rehabilitation     Barriers to Discharge        Equipment Recommendations  None recommended by PT    Recommendations for Other Services OT consult  Frequency 7X/week   Progress towards PT Goals Progress towards PT goals: Progressing toward goals  Plan Current plan remains appropriate    Precautions / Restrictions Precautions Precautions: Posterior Hip;Fall Precaution Comments: Pt recalls all THP without cues Restrictions Weight Bearing Restrictions: No Other Position/Activity Restrictions: WBAT   Pertinent Vitals/Pain BP 133/83; pain 6/10; premedicated.    Mobility  Bed Mobility Overal bed mobility: Needs Assistance Bed Mobility: Sit to Supine Sit to supine: Min assist;Mod assist General bed mobility comments: cues for sequencing and use of L LE to self assist Transfers Overall transfer level: Needs assistance Equipment used: Rolling walker (2 wheeled) Transfers: Sit to/from Stand Sit to Stand: Min assist General transfer comment: cues for LE management and use of UEs to self assist Ambulation/Gait Ambulation/Gait assistance: Min guard Ambulation Distance (Feet): 95 Feet (and 15) Assistive device: Rolling walker (2 wheeled) Gait Pattern/deviations: Step-to pattern;Decreased step length - right;Decreased step length - left;Shuffle;Trunk flexed Gait velocity: decr General Gait Details: cues for posture, sequence and position from RW; multiple rest for task completion    Exercises     PT Diagnosis:    PT Problem List:    PT Treatment Interventions:     PT Goals (current goals can now be found in the care plan section) Acute Rehab PT Goals Patient Stated Goal: Walk without a walker  PT Goal Formulation: With patient Time For Goal Achievement: 04/12/13 Potential to Achieve Goals: Good  Visit Information  Last PT Received On: 04/06/13 Assistance Needed: +1    Subjective Data  Subjective: I just cant try to do those stairs today, I just cant lift that leg well enough Patient Stated Goal: Walk without a walker    Cognition  Cognition Arousal/Alertness: Awake/alert Behavior During Therapy: WFL for tasks assessed/performed Overall Cognitive Status: Within Functional Limits for tasks assessed    Balance     End of Session PT - End of Session Equipment Utilized During Treatment: Gait belt Activity Tolerance: Patient tolerated treatment well;Patient limited by pain Patient left: in bed;with call bell/phone within reach Nurse Communication: Mobility status   GP     Tami Pearson 04/06/2013, 2:42 PM

## 2013-04-06 NOTE — Discharge Summary (Signed)
Physician Discharge Summary   Patient ID: Tami Pearson MRN: [id number] DOB/AGE: 06/27/1965 48 y.o.  Admit date: 04/04/2013 Discharge date: 04-06-2013  Primary Diagnosis:  Osteoarthritis Right hip  Admission Diagnoses:  Past Medical History  Diagnosis Date  . Hypertension   . Arthritis 2010 approx    bilateral hip and knee pain  . Diabetes mellitus without complication    Discharge Diagnoses:   Active Problems:   OA (osteoarthritis) of hip  Estimated body mass index is 42.88 kg/(m^2) as calculated from the following:   Height as of this encounter: '5\' 3"'  (1.6 m).   Weight as of this encounter: 109.77 kg (242 lb).  Procedure(s) (LRB): RIGHT TOTAL HIP ARTHROPLASTY (Right)   Consults: None  HPI: Tami Pearson is a 48 y.o. female with end stage arthritis of her right hip with progressively worsening pain and dysfunction. Pain occurs with activity and rest including pain at night. She has tried analgesics, protected weight bearing and rest without benefit. Pain is too severe to attempt physical therapy. Radiographs demonstrate bone on bone arthritis with subchondral cyst formation. She presents now for right THA.  Laboratory Data: Admission on 04/04/2013  Component Date Value Ref Range Status  . Glucose-Capillary 04/04/2013 86  70 - 99 mg/dL Final  . Glucose-Capillary 04/04/2013 79  70 - 99 mg/dL Final  . Comment 1 04/04/2013 Documented in Chart   Final  . Comment 2 04/04/2013 Notify RN   Final  . WBC 04/05/2013 15.4* 4.0 - 10.5 K/uL Final  . RBC 04/05/2013 3.76* 3.87 - 5.11 MIL/uL Final  . Hemoglobin 04/05/2013 10.8* 12.0 - 15.0 g/dL Final  . HCT 04/05/2013 32.5* 36.0 - 46.0 % Final  . MCV 04/05/2013 86.4  78.0 - 100.0 fL Final  . MCH 04/05/2013 28.7  26.0 - 34.0 pg Final  . MCHC 04/05/2013 33.2  30.0 - 36.0 g/dL Final  . RDW 04/05/2013 14.4  11.5 - 15.5 % Final  . Platelets 04/05/2013 297  150 - 400 K/uL Final  . Sodium 04/05/2013 139  137 - 147 mEq/L Final  .  Potassium 04/05/2013 3.9  3.7 - 5.3 mEq/L Final  . Chloride 04/05/2013 101  96 - 112 mEq/L Final  . CO2 04/05/2013 24  19 - 32 mEq/L Final  . Glucose, Bld 04/05/2013 165* 70 - 99 mg/dL Final  . BUN 04/05/2013 24* 6 - 23 mg/dL Final  . Creatinine, Ser 04/05/2013 0.99  0.50 - 1.10 mg/dL Final  . Calcium 04/05/2013 8.9  8.4 - 10.5 mg/dL Final  . GFR calc non Af Amer 04/05/2013 66* >90 mL/min Final  . GFR calc Af Amer 04/05/2013 77* >90 mL/min Final   Comment: (NOTE)                          The eGFR has been calculated using the CKD EPI equation.                          This calculation has not been validated in all clinical situations.                          eGFR's persistently <90 mL/min signify possible Chronic Kidney                          Disease.  . Glucose-Capillary 04/05/2013 127* 70 - 99 mg/dL Final  .  Glucose-Capillary 04/05/2013 117* 70 - 99 mg/dL Final  . WBC 04/06/2013 15.7* 4.0 - 10.5 K/uL Final  . RBC 04/06/2013 3.61* 3.87 - 5.11 MIL/uL Final  . Hemoglobin 04/06/2013 10.2* 12.0 - 15.0 g/dL Final  . HCT 04/06/2013 31.1* 36.0 - 46.0 % Final  . MCV 04/06/2013 86.1  78.0 - 100.0 fL Final  . MCH 04/06/2013 28.3  26.0 - 34.0 pg Final  . MCHC 04/06/2013 32.8  30.0 - 36.0 g/dL Final  . RDW 04/06/2013 14.6  11.5 - 15.5 % Final  . Platelets 04/06/2013 281  150 - 400 K/uL Final  . Sodium 04/06/2013 138  137 - 147 mEq/L Final  . Potassium 04/06/2013 3.9  3.7 - 5.3 mEq/L Final  . Chloride 04/06/2013 100  96 - 112 mEq/L Final  . CO2 04/06/2013 27  19 - 32 mEq/L Final  . Glucose, Bld 04/06/2013 131* 70 - 99 mg/dL Final  . BUN 04/06/2013 20  6 - 23 mg/dL Final  . Creatinine, Ser 04/06/2013 0.96  0.50 - 1.10 mg/dL Final  . Calcium 04/06/2013 9.3  8.4 - 10.5 mg/dL Final  . GFR calc non Af Amer 04/06/2013 69* >90 mL/min Final  . GFR calc Af Amer 04/06/2013 80* >90 mL/min Final   Comment: (NOTE)                          The eGFR has been calculated using the CKD EPI equation.                           This calculation has not been validated in all clinical situations.                          eGFR's persistently <90 mL/min signify possible Chronic Kidney                          Disease.  . Glucose-Capillary 04/05/2013 179* 70 - 99 mg/dL Final  . Glucose-Capillary 04/05/2013 185* 70 - 99 mg/dL Final  . Glucose-Capillary 04/06/2013 114* 70 - 99 mg/dL Final  Hospital Outpatient Visit on 04/03/2013  Component Date Value Ref Range Status  . aPTT 04/03/2013 28  24 - 37 seconds Final  . WBC 04/03/2013 7.9  4.0 - 10.5 K/uL Final  . RBC 04/03/2013 4.47  3.87 - 5.11 MIL/uL Final  . Hemoglobin 04/03/2013 12.8  12.0 - 15.0 g/dL Final  . HCT 04/03/2013 39.2  36.0 - 46.0 % Final  . MCV 04/03/2013 87.7  78.0 - 100.0 fL Final  . MCH 04/03/2013 28.6  26.0 - 34.0 pg Final  . MCHC 04/03/2013 32.7  30.0 - 36.0 g/dL Final  . RDW 04/03/2013 14.4  11.5 - 15.5 % Final  . Platelets 04/03/2013 351  150 - 400 K/uL Final  . Sodium 04/03/2013 140  137 - 147 mEq/L Final  . Potassium 04/03/2013 3.9  3.7 - 5.3 mEq/L Final  . Chloride 04/03/2013 100  96 - 112 mEq/L Final  . CO2 04/03/2013 27  19 - 32 mEq/L Final  . Glucose, Bld 04/03/2013 98  70 - 99 mg/dL Final  . BUN 04/03/2013 25* 6 - 23 mg/dL Final  . Creatinine, Ser 04/03/2013 1.06  0.50 - 1.10 mg/dL Final  . Calcium 04/03/2013 9.6  8.4 - 10.5 mg/dL Final  . Total Protein 04/03/2013 8.7* 6.0 - 8.3  g/dL Final  . Albumin 04/03/2013 3.3* 3.5 - 5.2 g/dL Final  . AST 04/03/2013 20  0 - 37 U/L Final  . ALT 04/03/2013 16  0 - 35 U/L Final  . Alkaline Phosphatase 04/03/2013 73  39 - 117 U/L Final  . Total Bilirubin 04/03/2013 <0.2* 0.3 - 1.2 mg/dL Final  . GFR calc non Af Amer 04/03/2013 61* >90 mL/min Final  . GFR calc Af Amer 04/03/2013 71* >90 mL/min Final   Comment: (NOTE)                          The eGFR has been calculated using the CKD EPI equation.                          This calculation has not been validated in all clinical  situations.                          eGFR's persistently <90 mL/min signify possible Chronic Kidney                          Disease.  Marland Kitchen Prothrombin Time 04/03/2013 12.8  11.6 - 15.2 seconds Final  . INR 04/03/2013 0.98  0.00 - 1.49 Final  . Color, Urine 04/03/2013 YELLOW  YELLOW Final  . APPearance 04/03/2013 CLEAR  CLEAR Final  . Specific Gravity, Urine 04/03/2013 1.020  1.005 - 1.030 Final  . pH 04/03/2013 5.0  5.0 - 8.0 Final  . Glucose, UA 04/03/2013 NEGATIVE  NEGATIVE mg/dL Final  . Hgb urine dipstick 04/03/2013 NEGATIVE  NEGATIVE Final  . Bilirubin Urine 04/03/2013 NEGATIVE  NEGATIVE Final  . Ketones, ur 04/03/2013 NEGATIVE  NEGATIVE mg/dL Final  . Protein, ur 04/03/2013 NEGATIVE  NEGATIVE mg/dL Final  . Urobilinogen, UA 04/03/2013 0.2  0.0 - 1.0 mg/dL Final  . Nitrite 04/03/2013 NEGATIVE  NEGATIVE Final  . Leukocytes, UA 04/03/2013 SMALL* NEGATIVE Final  . MRSA, PCR 04/03/2013 NEGATIVE  NEGATIVE Final  . Staphylococcus aureus 04/03/2013 NEGATIVE  NEGATIVE Final   Comment:                                 The Xpert SA Assay (FDA                          approved for NASAL specimens                          in patients over 36 years of age),                          is one component of                          a comprehensive surveillance                          program.  Test performance has                          been validated by [employer]  [facility] for patients greater                          than or equal to 68 year old.                          It is not intended                          to diagnose infection nor to                          guide or monitor treatment.  . ABO/RH(D) 04/03/2013 B POS   Final  . Antibody Screen 04/03/2013 NEG   Final  . Sample Expiration 04/03/2013 04/07/2013   Final  . Preg, Serum 04/03/2013 NEGATIVE  NEGATIVE Final  . Squamous Epithelial / LPF 04/03/2013 FEW* RARE Final  . WBC, UA 04/03/2013 3-6  <3 WBC/hpf Final  .  RBC / HPF 04/03/2013 0-2  <3 RBC/hpf Final  . Bacteria, UA 04/03/2013 FEW* RARE Final  Office Visit on 02/19/2013  Component Date Value Ref Range Status  . Hemoglobin A1C 02/19/2013 6.2* <5.7 % Final   Comment:                                                                                                 According to the ADA Clinical Practice Recommendations for 2011, when                          HbA1c is used as a screening test:                                                       >=6.5%   Diagnostic of Diabetes Mellitus                                     (if abnormal result is confirmed)                                                     5.7-6.4%   Increased risk of developing Diabetes Mellitus                                                     References:Diagnosis and Classification of Diabetes Mellitus,Diabetes  EXNT,7001,74(BSWHQ 1):S62-S69 and Standards of Medical Care in                                  Diabetes - 2011,Diabetes PRFF,6384,66 (Suppl 1):S11-S61.                             . Mean Plasma Glucose 02/19/2013 131* <117 mg/dL Final  . Sodium 02/19/2013 140  135 - 145 mEq/L Final  . Potassium 02/19/2013 3.9  3.5 - 5.3 mEq/L Final  . Chloride 02/19/2013 103  96 - 112 mEq/L Final  . CO2 02/19/2013 26  19 - 32 mEq/L Final  . Glucose, Bld 02/19/2013 111* 70 - 99 mg/dL Final  . BUN 02/19/2013 24* 6 - 23 mg/dL Final  . Creat 02/19/2013 0.99  0.50 - 1.10 mg/dL Final  . Calcium 02/19/2013 9.5  8.4 - 10.5 mg/dL Final  . WBC 02/19/2013 6.9  4.0 - 10.5 K/uL Final  . RBC 02/19/2013 4.31  3.87 - 5.11 MIL/uL Final  . Hemoglobin 02/19/2013 12.2  12.0 - 15.0 g/dL Final  . HCT 02/19/2013 36.2  36.0 - 46.0 % Final  . MCV 02/19/2013 84.0  78.0 - 100.0 fL Final  . MCH 02/19/2013 28.3  26.0 - 34.0 pg Final  . MCHC 02/19/2013 33.7  30.0 - 36.0 g/dL Final  . RDW 02/19/2013 14.4  11.5 - 15.5 % Final  . Platelets 02/19/2013 414* 150 - 400 K/uL Final  .  Neutrophils Relative % 02/19/2013 52  43 - 77 % Final  . Neutro Abs 02/19/2013 3.6  1.7 - 7.7 K/uL Final  . Lymphocytes Relative 02/19/2013 39  12 - 46 % Final  . Lymphs Abs 02/19/2013 2.7  0.7 - 4.0 K/uL Final  . Monocytes Relative 02/19/2013 7  3 - 12 % Final  . Monocytes Absolute 02/19/2013 0.5  0.1 - 1.0 K/uL Final  . Eosinophils Relative 02/19/2013 1  0 - 5 % Final  . Eosinophils Absolute 02/19/2013 0.1  0.0 - 0.7 K/uL Final  . Basophils Relative 02/19/2013 1  0 - 1 % Final  . Basophils Absolute 02/19/2013 0.1  0.0 - 0.1 K/uL Final  . Smear Review 02/19/2013 Criteria for review not met   Final  . Ferritin 02/19/2013 412* 10 - 291 ng/mL Final  . Iron 02/19/2013 64  42 - 145 ug/dL Final     X-Rays:Dg Hip Complete Right  04/03/2013   CLINICAL DATA:  Preop hip surgery  EXAM: RIGHT HIP - COMPLETE 2+ VIEW  COMPARISON:  11/15/2012 and earlier studies  FINDINGS: Visualized portions of the left hip arthroplasty project in expected location. Marked narrowing of the articular cartilage in the right hip with near bone on bone apposition at the superior margin of the femoral head, with subchondral sclerosis in the adjacent femoral head and acetabulum. Marginal spurs from the femoral head. Negative for fracture, dislocation, or other acute bone abnormality.  IMPRESSION: 1. Advanced right hip arthritis. 2. Prior left hip arthroplasty.   Electronically Signed   By: Arne Cleveland M.D.   On: 04/03/2013 16:12   Dg Pelvis Portable  04/04/2013   CLINICAL DATA:  Postoperative right hip surgery.  EXAM: PORTABLE PELVIS 1-2 VIEWS  COMPARISON:  DG PELVIS PORTABLE dated 11/15/2012  FINDINGS: The patient has undergone right total hip joint prosthesis placement. Radiographic position of the prosthetic components is radiographically good. Surgical  drain lines are present. 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  IMPRESSION: The patient has undergone right total hip joint prosthesis placement without immediate  postprocedure complication.   Electronically Signed   By: David  Martinique   On: 04/04/2013 16:39    EKG: Orders placed in visit on 09/19/12  . EKG 12-LEAD     Hospital Course: Patient was admitted to [facility] and taken to the OR and underwent the above state procedure without complications.  Patient tolerated the procedure well and was later transferred to the recovery room and then to the orthopaedic floor for postoperative care.  They were given PO and IV analgesics for pain control following their surgery.  They were given 24 hours of postoperative antibiotics of  Anti-infectives   Start     Dose/Rate Route Frequency Ordered Stop   04/04/13 2000  ceFAZolin (ANCEF) IVPB 2 g/50 mL premix     2 g 100 mL/hr over 30 Minutes Intravenous Every 6 hours 04/04/13 1811 04/05/13 0328   04/04/13 1111  ceFAZolin (ANCEF) IVPB 2 g/50 mL premix     2 g 100 mL/hr over 30 Minutes Intravenous On call to O.R. 04/04/13 1111 04/04/13 1415     and started on DVT prophylaxis in the form of Xarelto.   PT and OT were ordered for total hip protocol.  The patient was allowed to be WBAT with therapy. Discharge planning was consulted to help with postop disposition and equipment needs.  Patient had a good night on the evening of surgery.  They started to get up OOB with therapy on day one.  Hemovac drain was pulled without difficulty.  The knee immobilizer was removed and discontinued.  Continued to work with therapy into day two.  Dressing was changed on day two and the incision was healing well.  Patient was seen in rounds by Dr. Wynelle Link and was ready to go home.   Discharge Medications: Prior to Admission medications   Medication Sig Start Date End Date Taking? Authorizing Provider  metFORMIN (GLUCOPHAGE) 500 MG tablet Take 1 tablet (500 mg total) by mouth 2 (two) times daily with a meal. 02/27/13  Yes Fayrene Helper, MD  triamterene-hydrochlorothiazide (MAXZIDE) 75-50 MG per tablet Take 1 tablet by  mouth every morning.   Yes Historical Provider, MD  methocarbamol (ROBAXIN) 500 MG tablet Take 1 tablet (500 mg total) by mouth every 6 (six) hours as needed for muscle spasms. 04/06/13   Alexzandrew Perkins, PA-C  oxyCODONE (OXY IR/ROXICODONE) 5 MG immediate release tablet Take 1-2 tablets (5-10 mg total) by mouth every 3 (three) hours as needed for moderate pain, severe pain or breakthrough pain. 04/06/13   Alexzandrew Dara Lords, PA-C  rivaroxaban (XARELTO) 10 MG TABS tablet Take 1 tablet (10 mg total) by mouth daily with breakfast. Take Xarelto for two and a half more weeks, then discontinue Xarelto. Once the patient has completed the blood thinner regimen, then take a Baby 81 mg Aspirin daily for four more weeks. 04/06/13   Alexzandrew Perkins, PA-C  traMADol (ULTRAM) 50 MG tablet Take 1-2 tablets (50-100 mg total) by mouth every 6 (six) hours as needed (mild to moderate pain). 04/06/13   Alexzandrew Perkins, PA-C    No bending hip over 90 degrees- A "L" Angle Do not cross legs Do not let foot roll inward When turning these patients a pillow should be placed between the patient's legs to prevent crossing. Patients should have the affected knee fully extended when trying to  sit or stand from all surfaces to prevent excessive hip flexion. When ambulating and turning toward the affected side the affected leg should have the toes turned out prior to moving the walker and the rest of patient's body as to prevent internal rotation/ turning in of the leg. Abduction pillows are the most effective way to prevent a patient from not crossing legs or turning toes in at rest. If an abduction pillow is not ordered placing a regular pillow length wise between the patient's legs is also an effective reminder. It is imperative that these precautions be maintained so that the surgical hip does not dislocate. Discharge home with home health  Diet - Cardiac diet  Follow up - in 2 weeks  Activity - WBAT  Disposition - Home   Condition Upon Discharge - Good  D/C Meds - See DC Summary  DVT Prophylaxis - Xarelto       Discharge Orders   Future Appointments Provider Department Dept Phone   08/20/2013 1:00 PM Fayrene Helper, MD [facility] [phone]   Patient should bring all necessary paperwork to be completed.  Arrive 15 minutes prior to the appointment.   Future Orders Complete By Expires   Call MD / Call 911  As directed    Comments:     If you experience chest pain or shortness of breath, CALL 911 and be transported to the hospital emergency room.  If you develope a fever above 101 F, pus (white drainage) or increased drainage or redness at the wound, or calf pain, call your surgeon's office.   Change dressing  As directed    Comments:     You may change your dressing dressing daily with sterile 4 x 4 inch gauze dressing and paper tape.  Do not submerge the incision under water.   Constipation Prevention  As directed    Comments:     Drink plenty of fluids.  Prune juice may be helpful.  You may use a stool softener, such as Colace (over the counter) 100 mg twice a day.  Use MiraLax (over the counter) for constipation as needed.   Diet general  As directed    Discharge instructions  As directed    Comments:     Pick up stool softner and laxative for home. Do not submerge incision under water. May shower. Continue to use ice for pain and swelling from surgery.  Take Xarelto for two and a half more weeks, then discontinue Xarelto. Once the patient has completed the blood thinner regimen, then take a Baby 81 mg Aspirin daily for four more weeks.   Do not sit on low chairs, stoools or toilet seats, as it may be difficult to get up from low surfaces  As directed    Driving restrictions  As directed    Comments:     No driving until released by the physician.   Follow the hip precautions as taught in Physical Therapy  As directed    Increase activity slowly as tolerated  As directed     Lifting restrictions  As directed    Comments:     No lifting until released by the physician.   Patient may shower  As directed    Comments:     You may shower without a dressing once there is no drainage.  Do not wash over the wound.  If drainage remains, do not shower until drainage stops.   TED hose  As directed  Comments:     Use stockings (TED hose) for 3 weeks on both leg(s).  You may remove them at night for sleeping.   Weight bearing as tolerated  As directed    Questions:     Laterality:     Extremity:         Medication List    STOP taking these medications       cholecalciferol 1000 UNITS tablet  Commonly known as:  VITAMIN D      TAKE these medications       metFORMIN 500 MG tablet  Commonly known as:  GLUCOPHAGE  Take 1 tablet (500 mg total) by mouth 2 (two) times daily with a meal.     methocarbamol 500 MG tablet  Commonly known as:  ROBAXIN  Take 1 tablet (500 mg total) by mouth every 6 (six) hours as needed for muscle spasms.     oxyCODONE 5 MG immediate release tablet  Commonly known as:  Oxy IR/ROXICODONE  Take 1-2 tablets (5-10 mg total) by mouth every 3 (three) hours as needed for moderate pain, severe pain or breakthrough pain.     rivaroxaban 10 MG Tabs tablet  Commonly known as:  XARELTO  - Take 1 tablet (10 mg total) by mouth daily with breakfast. Take Xarelto for two and a half more weeks, then discontinue Xarelto.  - Once the patient has completed the blood thinner regimen, then take a Baby 81 mg Aspirin daily for four more weeks.     traMADol 50 MG tablet  Commonly known as:  ULTRAM  Take 1-2 tablets (50-100 mg total) by mouth every 6 (six) hours as needed (mild to moderate pain).     triamterene-hydrochlorothiazide 75-50 MG per tablet  Commonly known as:  MAXZIDE  Take 1 tablet by mouth every morning.       Follow-up Information   Follow up with [place])    Contact information:    [place] [address] [phone]       Follow up with Gearlean Alf, MD. Schedule an appointment as soon as possible for a visit in 2 weeks.   Specialty:  Orthopedic Surgery   Contact information:   [address] [place] [address] [place] [address] [phone]       Signed: Mickel Crow 04/06/2013, 9:22 AM

## 2013-04-06 NOTE — Progress Notes (Signed)
Physical Therapy Treatment Patient Details Name: Tami Pearson MRN: [id number] DOB: 05/30/1965 Today's Date: 04/06/2013 Time: [id number] PT Time Calculation (min): 39 min  PT Assessment / Plan / Recommendation  History of Present Illness     PT Comments   Pt c/o increased discomfort this am but able to participate with therex and ambulate in hall as well as to/from bathroom.  Stairs deferred this am at pt request.  Follow Up Recommendations  Home health PT     Does the patient have the potential to tolerate intense rehabilitation     Barriers to Discharge        Equipment Recommendations  None recommended by PT    Recommendations for Other Services OT consult  Frequency 7X/week   Progress towards PT Goals Progress towards PT goals: Progressing toward goals  Plan Current plan remains appropriate    Precautions / Restrictions Precautions Precautions: Posterior Hip;Fall Precaution Comments: Pt recalls all THP without cues Restrictions Weight Bearing Restrictions: No Other Position/Activity Restrictions: WBAT   Pertinent Vitals/Pain 6/10; premed; ice pack provided but declined    Mobility  Bed Mobility Overal bed mobility: Needs Assistance Bed Mobility: Supine to Sit Supine to sit: Min assist General bed mobility comments: cues for sequencing and use of L LE to self assist Transfers Overall transfer level: Needs assistance Equipment used: Rolling walker (2 wheeled) Transfers: Sit to/from Stand Sit to Stand: Min assist General transfer comment: cues for LE management and use of UEs to self assist Ambulation/Gait Ambulation/Gait assistance: Min guard Ambulation Distance (Feet): 100 Feet (and 15') Assistive device: Rolling walker (2 wheeled) Gait Pattern/deviations: Step-to pattern;Decreased step length - right;Decreased step length - left;Trunk flexed Gait velocity: decr General Gait Details: cues for posture, sequence and position from RW    Exercises Total  Joint Exercises Ankle Circles/Pumps: AROM;Both;Supine;15 reps Quad Sets: AROM;Both;10 reps;Supine Gluteal Sets: AROM;Both;10 reps;Supine Heel Slides: AAROM;Right;15 reps;Supine Hip ABduction/ADduction: AAROM;Right;Supine;15 reps   PT Diagnosis:    PT Problem List:   PT Treatment Interventions:     PT Goals (current goals can now be found in the care plan section) Acute Rehab PT Goals Patient Stated Goal: Walk without a walker  PT Goal Formulation: With patient Time For Goal Achievement: 04/12/13 Potential to Achieve Goals: Good  Visit Information  Last PT Received On: 04/06/13 Assistance Needed: +1    Subjective Data  Subjective: I am a lot stiffer today, I can hardly move Patient Stated Goal: Walk without a walker    Cognition  Cognition Arousal/Alertness: Awake/alert Behavior During Therapy: WFL for tasks assessed/performed Overall Cognitive Status: Within Functional Limits for tasks assessed    Balance     End of Session PT - End of Session Equipment Utilized During Treatment: Gait belt Activity Tolerance: Patient tolerated treatment well;Patient limited by pain Patient left: in chair;with call bell/phone within reach;with family/visitor present Nurse Communication: Mobility status   GP     Garnett Nunziata 04/06/2013, 12:36 PM

## 2013-04-06 NOTE — Progress Notes (Signed)
   Subjective: 2 Days Post-Op Procedure(s) (LRB): RIGHT TOTAL HIP ARTHROPLASTY (Right) Patient reports pain as mild.   Patient seen in rounds with Dr. Lequita HaltAluisio. Patient is well, and has had no acute complaints or problems Patient is ready to go home later today  Objective: Vital signs in last 24 hours: Temp:  [97.7 F (36.5 C)-99.1 F (37.3 C)] 99.1 F (37.3 C) (03/06 0619) Pulse Rate:  [76-90] 90 (03/06 0619) Resp:  [16-18] 16 (03/06 0619) BP: (97-129)/(62-77) 110/73 mmHg (03/06 0619) SpO2:  [95 %-100 %] 97 % (03/06 0619)  Intake/Output from previous day:  Intake/Output Summary (Last 24 hours) at 04/06/13 0858 Last data filed at 04/06/13 0620  Gross per 24 hour  Intake    600 ml  Output    300 ml  Net    300 ml    Intake/Output this shift:    Labs:  Recent Labs  04/03/13 1520 04/05/13 0503 04/06/13 0410  HGB 12.8 10.8* 10.2*    Recent Labs  04/05/13 0503 04/06/13 0410  WBC 15.4* 15.7*  RBC 3.76* 3.61*  HCT 32.5* 31.1*  PLT 297 281    Recent Labs  04/05/13 0503 04/06/13 0410  NA 139 138  K 3.9 3.9  CL 101 100  CO2 24 27  BUN 24* 20  CREATININE 0.99 0.96  GLUCOSE 165* 131*  CALCIUM 8.9 9.3    Recent Labs  04/03/13 1520  INR 0.98    EXAM: General - Patient is Alert, Appropriate and Oriented Extremity - Neurovascular intact Sensation intact distally Incision - clean, dry, no drainage, healing Motor Function - intact, moving foot and toes well on exam.   Assessment/Plan: 2 Days Post-Op Procedure(s) (LRB): RIGHT TOTAL HIP ARTHROPLASTY (Right) Procedure(s) (LRB): RIGHT TOTAL HIP ARTHROPLASTY (Right) Past Medical History  Diagnosis Date  . Hypertension   . Arthritis 2010 approx    bilateral hip and knee pain  . Diabetes mellitus without complication    Active Problems:   OA (osteoarthritis) of hip  Estimated body mass index is 42.88 kg/(m^2) as calculated from the following:   Height as of this encounter: 5\' 3"  (1.6 m).   Weight  as of this encounter: 109.77 kg (242 lb). Discharge home with home health Diet - Cardiac diet Follow up - in 2 weeks Activity - WBAT Disposition - Home Condition Upon Discharge - Good D/C Meds - See DC Summary DVT Prophylaxis - Xarelto  Tami Pearson 04/06/2013, 8:58 AM

## 2013-04-07 LAB — CBC
HEMATOCRIT: 30.5 % — AB (ref 36.0–46.0)
Hemoglobin: 9.6 g/dL — ABNORMAL LOW (ref 12.0–15.0)
MCH: 27.4 pg (ref 26.0–34.0)
MCHC: 31.5 g/dL (ref 30.0–36.0)
MCV: 87.1 fL (ref 78.0–100.0)
Platelets: 267 10*3/uL (ref 150–400)
RBC: 3.5 MIL/uL — ABNORMAL LOW (ref 3.87–5.11)
RDW: 14.8 % (ref 11.5–15.5)
WBC: 12.2 10*3/uL — AB (ref 4.0–10.5)

## 2013-04-07 LAB — GLUCOSE, CAPILLARY
GLUCOSE-CAPILLARY: 133 mg/dL — AB (ref 70–99)
Glucose-Capillary: 116 mg/dL — ABNORMAL HIGH (ref 70–99)

## 2013-04-07 NOTE — Progress Notes (Signed)
Discharged from floor via w/c, family with pt. No changes in assessment. Tami Pearson  

## 2013-04-07 NOTE — Progress Notes (Signed)
   Subjective: 3 Days Post-Op Procedure(s) (LRB): RIGHT TOTAL HIP ARTHROPLASTY (Right) Patient reports pain as mild.  Feeling much better today and looking forward to going home Plan is to go Home after hospital stay.  Objective: Vital signs in last 24 hours: Temp:  [98.6 F (37 C)-99.8 F (37.7 C)] 98.9 F (37.2 C) (03/07 0537) Pulse Rate:  [83-89] 83 (03/07 0537) Resp:  [16-18] 18 (03/07 0537) BP: (112-162)/(69-81) 119/80 mmHg (03/07 0537) SpO2:  [96 %-99 %] 96 % (03/07 0537)  Intake/Output from previous day:  Intake/Output Summary (Last 24 hours) at 04/07/13 0911 Last data filed at 04/07/13 0600  Gross per 24 hour  Intake   1080 ml  Output      0 ml  Net   1080 ml    Intake/Output this shift:    Labs:  Recent Labs  04/05/13 0503 04/06/13 0410 04/07/13 0414  HGB 10.8* 10.2* 9.6*    Recent Labs  04/06/13 0410 04/07/13 0414  WBC 15.7* 12.2*  RBC 3.61* 3.50*  HCT 31.1* 30.5*  PLT 281 267    Recent Labs  04/05/13 0503 04/06/13 0410  NA 139 138  K 3.9 3.9  CL 101 100  CO2 24 27  BUN 24* 20  CREATININE 0.99 0.96  GLUCOSE 165* 131*  CALCIUM 8.9 9.3   No results found for this basename: LABPT, INR,  in the last 72 hours  EXAM General - Patient is Alert, Appropriate and Oriented Extremity - Neurologically intact Neurovascular intact Incision: dressing C/D/I No cellulitis present Compartment soft Dressing/Incision - clean, dry, no drainage Motor Function - intact, moving foot and toes well on exam.   Past Medical History  Diagnosis Date  . Hypertension   . Arthritis 2010 approx    bilateral hip and knee pain  . Diabetes mellitus without complication     Assessment/Plan: 3 Days Post-Op Procedure(s) (LRB): RIGHT TOTAL HIP ARTHROPLASTY (Right) Active Problems:   OA (osteoarthritis) of hip   Discharge home with home health  DVT Prophylaxis - Xarelto Weight Bearing As Tolerated right Leg  Kaiden Dardis V 04/07/2013, 9:11 AM

## 2013-04-07 NOTE — Progress Notes (Signed)
Physical Therapy Treatment Patient Details Name: Rica Recordsngeline L Neisler MRN: [id number] DOB: 06/10/1965 Today's Date: 04/07/2013 Time: [id number] PT Time Calculation (min): 28 min  PT Assessment / Plan / Recommendation  History of Present Illness     PT Comments   POD # 3 pt plans to D/C to home today.  Assisted OOB to amb in hallway, practiced negociating 2 steps backward with RW due to no rails then returned to bed to perform THR TE's.    Follow Up Recommendations  Home health PT     Does the patient have the potential to tolerate intense rehabilitation     Barriers to Discharge        Equipment Recommendations  None recommended by PT    Recommendations for Other Services    Frequency 7X/week   Progress towards PT Goals Progress towards PT goals: Progressing toward goals  Plan      Precautions / Restrictions Precautions Precautions: Posterior Hip;Fall Precaution Comments: Pt recalls all THP without cues Restrictions Weight Bearing Restrictions: No Other Position/Activity Restrictions: WBAT   Pertinent Vitals/Pain C/o "alot sorness"    Mobility  Bed Mobility Overal bed mobility: Modified Independent General bed mobility comments: increased time Transfers Overall transfer level: Needs assistance Equipment used: Rolling walker (2 wheeled) Transfers: Sit to/from Stand Sit to Stand: Supervision General transfer comment: increased time Ambulation/Gait Ambulation/Gait assistance: Supervision Ambulation Distance (Feet): 75 Feet Assistive device: Rolling walker (2 wheeled) Gait Pattern/deviations: Step-to pattern;Trunk flexed;Decreased stance time - right Gait velocity: decr General Gait Details: increased time Stairs: Yes Stairs assistance: Min assist Stair Management: No rails;Backwards;With walker Number of Stairs: 2 General stair comments: 25% VC's on proper tech and safety    Exercises   Total Hip Replacement TE's 10 reps ankle pumps 10 reps knee presses 10 reps  heel slides 10 reps SAQ's 10 reps ABD Followed by [facility]    PT Goals (current goals can now be found in the care plan section)    Visit Information  Last PT Received On: 04/07/13    Subjective Data      Cognition       Balance     End of Session PT - End of Session Equipment Utilized During Treatment: Gait belt Activity Tolerance: Patient tolerated treatment well Patient left: in bed;with call bell/phone within reach;with family/visitor present   Felecia ShellingLori Jayro Mcmath  PTA [facility]  Acute  Rehab Pager      [phone]

## 2013-05-22 ENCOUNTER — Telehealth: Payer: Self-pay

## 2013-05-22 MED ORDER — ALPRAZOLAM 0.5 MG PO TABS: 0.5000 mg | ORAL_TABLET | Freq: Every day | ORAL | Status: DC

## 2013-05-22 MED ORDER — TEMAZEPAM 15 MG PO CAPS: 15.0000 mg | ORAL_CAPSULE | Freq: Every evening | ORAL | Status: DC | PRN

## 2013-05-22 NOTE — Telephone Encounter (Signed)
pls send in xanax 0.5 mg one at night #30 only and restoril 15mg  one at night #30 only Let her know the family is in our thoughts, and I know that this time is difficult for them

## 2013-05-22 NOTE — Addendum Note (Signed)
Addended by: Kandis FantasiaSLADE, Marieme Mcmackin B on: 05/22/2013 09:26 AM   Modules accepted: Orders

## 2013-05-22 NOTE — Telephone Encounter (Signed)
Patient aware and very appreciative.

## 2013-06-19 ENCOUNTER — Other Ambulatory Visit: Payer: Self-pay

## 2013-06-19 ENCOUNTER — Telehealth: Payer: Self-pay

## 2013-06-19 MED ORDER — TEMAZEPAM 15 MG PO CAPS: 15.0000 mg | ORAL_CAPSULE | Freq: Every evening | ORAL | Status: DC | PRN

## 2013-06-19 MED ORDER — ALPRAZOLAM 0.5 MG PO TABS: 0.5000 mg | ORAL_TABLET | Freq: Every day | ORAL | Status: DC

## 2013-06-19 NOTE — Telephone Encounter (Signed)
Patient aware.

## 2013-06-19 NOTE — Telephone Encounter (Signed)
Wants a refill on her anxiety med- xanax and also her sleep med- temazepam. She is still having a hard time over her mothers passing. She has been taking 2 of the xanax's a day too. Do you want to increase the dose of xanax or just refill both like she had originally got in the past? Please advise. [facility] [place]

## 2013-06-19 NOTE — Telephone Encounter (Signed)
As she had been doing one only refill only, she needs an OV also before any additional refills

## 2013-08-15 ENCOUNTER — Other Ambulatory Visit: Payer: Self-pay | Admitting: Family Medicine

## 2013-08-20 ENCOUNTER — Other Ambulatory Visit (HOSPITAL_COMMUNITY)
Admission: RE | Admit: 2013-08-20 | Discharge: 2013-08-20 | Disposition: A | Discharge status: Home / Self Care | Payer: BC Managed Care – PPO | Source: Ambulatory Visit | Attending: Family Medicine | Admitting: Family Medicine

## 2013-08-20 ENCOUNTER — Encounter (INDEPENDENT_AMBULATORY_CARE_PROVIDER_SITE_OTHER): Payer: Self-pay

## 2013-08-20 ENCOUNTER — Encounter: Payer: Self-pay | Admitting: Family Medicine

## 2013-08-20 ENCOUNTER — Ambulatory Visit (INDEPENDENT_AMBULATORY_CARE_PROVIDER_SITE_OTHER): Payer: BC Managed Care – PPO | Admitting: Family Medicine

## 2013-08-20 VITALS — BP 138/92 | HR 83 | Resp 16 | Ht 63.5 in | Wt 246.0 lb

## 2013-08-20 DIAGNOSIS — Z01419 Encounter for gynecological examination (general) (routine) without abnormal findings: Secondary | ICD-10-CM | POA: Insufficient documentation

## 2013-08-20 DIAGNOSIS — Z0000 Encounter for general adult medical examination without abnormal findings: Secondary | ICD-10-CM

## 2013-08-20 DIAGNOSIS — R7303 Prediabetes: Secondary | ICD-10-CM

## 2013-08-20 DIAGNOSIS — F5102 Adjustment insomnia: Secondary | ICD-10-CM

## 2013-08-20 DIAGNOSIS — Z124 Encounter for screening for malignant neoplasm of cervix: Secondary | ICD-10-CM

## 2013-08-20 DIAGNOSIS — M25562 Pain in left knee: Secondary | ICD-10-CM

## 2013-08-20 DIAGNOSIS — F3289 Other specified depressive episodes: Secondary | ICD-10-CM

## 2013-08-20 DIAGNOSIS — F329 Major depressive disorder, single episode, unspecified: Secondary | ICD-10-CM

## 2013-08-20 DIAGNOSIS — Z1211 Encounter for screening for malignant neoplasm of colon: Secondary | ICD-10-CM

## 2013-08-20 DIAGNOSIS — F32A Depression, unspecified: Secondary | ICD-10-CM

## 2013-08-20 DIAGNOSIS — Z1151 Encounter for screening for human papillomavirus (HPV): Secondary | ICD-10-CM | POA: Insufficient documentation

## 2013-08-20 DIAGNOSIS — I10 Essential (primary) hypertension: Secondary | ICD-10-CM

## 2013-08-20 DIAGNOSIS — M25569 Pain in unspecified knee: Secondary | ICD-10-CM

## 2013-08-20 DIAGNOSIS — F324 Major depressive disorder, single episode, in partial remission: Secondary | ICD-10-CM | POA: Insufficient documentation

## 2013-08-20 LAB — CBC
HEMATOCRIT: 39.7 % (ref 36.0–46.0)
Hemoglobin: 13.4 g/dL (ref 12.0–15.0)
MCH: 27.9 pg (ref 26.0–34.0)
MCHC: 33.8 g/dL (ref 30.0–36.0)
MCV: 82.7 fL (ref 78.0–100.0)
Platelets: 406 10*3/uL — ABNORMAL HIGH (ref 150–400)
RBC: 4.8 MIL/uL (ref 3.87–5.11)
RDW: 14.2 % (ref 11.5–15.5)
WBC: 7 10*3/uL (ref 4.0–10.5)

## 2013-08-20 LAB — POC HEMOCCULT BLD/STL (OFFICE/1-CARD/DIAGNOSTIC): Fecal Occult Blood, POC: NEGATIVE

## 2013-08-20 MED ORDER — HYDROCODONE-ACETAMINOPHEN 10-325 MG PO TABS
ORAL_TABLET | ORAL | Status: DC
Start: 2013-08-20 — End: 2013-09-14

## 2013-08-20 MED ORDER — FLUOXETINE HCL 20 MG PO TABS: 20.0000 mg | ORAL_TABLET | Freq: Every day | ORAL | Status: DC

## 2013-08-20 MED ORDER — TEMAZEPAM 30 MG PO CAPS: 30.0000 mg | ORAL_CAPSULE | Freq: Every evening | ORAL | Status: DC | PRN

## 2013-08-20 NOTE — Patient Instructions (Signed)
F/u in 2.5 month, call if you need me before  Know that you continue to be in my thoughts as you cope with  The passing of your Mom  You need to sign a pain contract for chronic meds for left knee pain, follow guidelines as printed and we discussed  New for depression and difficulty with sleep are fluoxetine and restoril, you are also referred to therapy ,that office will call you  Try to limit sweets and carb intake and reduce portion  Size so thatr you again start losing weight so knees will hold up for longer time  Blood pressure today is slightly elevated  HBA1C , CBc and chem 7 today please

## 2013-08-20 NOTE — Progress Notes (Signed)
   Subjective:    Patient ID: Tami Pearson, female    DOB: 10/10/1965, 48 y.o.   MRN: [id number]  HPI Patient is in for pelvic and breast exam. She c/o increased left knee pain , no recent trauma, requests chronic pain management for this. C/o worsening depression following the unexpected loss of her mother after a brief illness, wants help for this.  Review of Systems See HPI     Objective:   Physical Exam  BP 138/92  Pulse 83  Resp 16  Ht 5' 3.5" (1.613 m)  Wt 246 lb (111.585 kg)  BMI 42.89 kg/m2  SpO2 96%  Pleasant well nourished female, alert and oriented x 3, in no cardio-pulmonary distress. Afebrile. HEENT No facial trauma or asymetry. Sinuses non tender.  Extra occullar muscles intact, pupils equally reactive to light. External ears normal, tympanic membranes clear. Oropharynx moist, no exudate, good dentition. Neck: supple, no adenopathy,JVD or thyromegaly.No bruits.  Chest: Clear to ascultation bilaterally.No crackles or wheezes. Non tender to palpation  Breast: No asymetry,no masses or lumps. No tenderness. No nipple discharge or inversion. No axillary or supraclavicular adenopathy  Cardiovascular system; Heart sounds normal,  S1 and  S2 ,no S3.  No murmur, or thrill. Apical beat not displaced Peripheral pulses normal.  Abdomen: Soft, non tender, no organomegaly or masses. No bruits. Bowel sounds normal. No guarding, tenderness or rebound.  Rectal:  Normal sphincter tone. No mass.No rectal masses.  Guaiac negative stool.  GU: External genitalia normal female genitalia , female distribution of hair. No lesions. Urethral meatus normal in size, no  Prolapse, no lesions visibly  Present. Bladder non tender. Vagina pink and moist , with no visible lesions , discharge present . Adequate pelvic support no  cystocele or rectocele noted Cervix pink and appears healthy, no lesions or ulcerations noted, no discharge noted from os Uterus normal size,  no adnexal masses, no cervical motion or adnexal tenderness.   Musculoskeletal exam: Decreased  ROM of spine, hips ,  and knees.reduced mobility i most marked in left knee Mild  deformity ,swelling and  crepitus noted in both knees, left more than right No muscle wasting or atrophy.   Neurologic: Cranial nerves 2 to 12 intact. Power, tone ,sensation and reflexes normal throughout. No disturbance in gait. No tremor.  Skin: Intact, no ulceration, erythema , scaling or rash noted. Pigmentation normal throughout  Psych; Tearful and sad mood. Judgement and concentration normal. Positive depression screen Not suicidal or homicidal       Assessment & Plan:  Encounter for annual physical exam Annual exam as documented. Counseling done  re healthy lifestyle involving commitment to 150 minutes exercise per week, heart healthy diet, and attaining healthy weight.The importance of adequate sleep also discussed. Regular seat belt use and home safety, is also discussed. Changes in health habits are decided on by the patient with goals and time frames  set for achieving them. Immunization and cancer screening needs are specifically addressed at this visit.   Depression Medcation prescribed and pt referred for therapy She is not suicidal or homicidal  Left knee pain Worsened pain, pain contract entered for daily hydrocodone, pt also encouraged to work on weight loss  Special screening for malignant neoplasms, colon Heme negative stool, and no rectal/anal mass palpated

## 2013-08-21 LAB — HEMOGLOBIN A1C
Hgb A1c MFr Bld: 6.4 % — ABNORMAL HIGH (ref <5.7)
Mean Plasma Glucose: 137 mg/dL — ABNORMAL HIGH (ref <117)

## 2013-08-21 LAB — BASIC METABOLIC PANEL
BUN: 23 mg/dL (ref 6–23)
CALCIUM: 9.5 mg/dL (ref 8.4–10.5)
CHLORIDE: 102 meq/L (ref 96–112)
CO2: 24 mEq/L (ref 19–32)
Creat: 0.97 mg/dL (ref 0.50–1.10)
GLUCOSE: 79 mg/dL (ref 70–99)
Potassium: 3.8 mEq/L (ref 3.5–5.3)
SODIUM: 139 meq/L (ref 135–145)

## 2013-08-22 LAB — CYTOLOGY - PAP

## 2013-09-06 ENCOUNTER — Ambulatory Visit (INDEPENDENT_AMBULATORY_CARE_PROVIDER_SITE_OTHER): Payer: BC Managed Care – PPO | Admitting: Psychiatry

## 2013-09-06 ENCOUNTER — Encounter (HOSPITAL_COMMUNITY): Payer: Self-pay | Admitting: Psychiatry

## 2013-09-06 DIAGNOSIS — F329 Major depressive disorder, single episode, unspecified: Secondary | ICD-10-CM

## 2013-09-06 DIAGNOSIS — F3289 Other specified depressive episodes: Secondary | ICD-10-CM
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
## 2013-09-06 NOTE — Patient Instructions (Signed)
Discussed orally 

## 2013-09-14 ENCOUNTER — Ambulatory Visit (HOSPITAL_COMMUNITY): Payer: Self-pay | Admitting: Psychiatry

## 2013-09-14 ENCOUNTER — Other Ambulatory Visit: Payer: Self-pay

## 2013-09-14 DIAGNOSIS — M25562 Pain in left knee: Secondary | ICD-10-CM

## 2013-09-14 MED ORDER — HYDROCODONE-ACETAMINOPHEN 10-325 MG PO TABS: ORAL_TABLET | ORAL | Status: DC

## 2013-09-26 ENCOUNTER — Ambulatory Visit (HOSPITAL_COMMUNITY): Payer: Self-pay | Admitting: Psychiatry

## 2013-10-03 ENCOUNTER — Encounter (HOSPITAL_COMMUNITY): Payer: Self-pay | Admitting: Psychology

## 2013-10-03 ENCOUNTER — Ambulatory Visit (INDEPENDENT_AMBULATORY_CARE_PROVIDER_SITE_OTHER): Payer: BC Managed Care – PPO | Admitting: Psychology

## 2013-10-03 DIAGNOSIS — F329 Major depressive disorder, single episode, unspecified: Secondary | ICD-10-CM

## 2013-10-03 DIAGNOSIS — F3289 Other specified depressive episodes: Secondary | ICD-10-CM
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
## 2013-10-12 ENCOUNTER — Other Ambulatory Visit: Payer: Self-pay

## 2013-10-12 DIAGNOSIS — M25562 Pain in left knee: Secondary | ICD-10-CM

## 2013-10-12 MED ORDER — HYDROCODONE-ACETAMINOPHEN 10-325 MG PO TABS: ORAL_TABLET | ORAL | Status: DC

## 2013-10-16 ENCOUNTER — Ambulatory Visit (HOSPITAL_COMMUNITY): Payer: Self-pay | Admitting: Psychiatry

## 2013-10-25 ENCOUNTER — Other Ambulatory Visit: Payer: Self-pay | Admitting: Family Medicine

## 2013-11-01 ENCOUNTER — Ambulatory Visit (INDEPENDENT_AMBULATORY_CARE_PROVIDER_SITE_OTHER): Payer: BC Managed Care – PPO | Admitting: Psychology

## 2013-11-01 ENCOUNTER — Encounter (HOSPITAL_COMMUNITY): Payer: Self-pay | Admitting: Psychology

## 2013-11-01 DIAGNOSIS — F331 Major depressive disorder, recurrent, moderate: Secondary | ICD-10-CM
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
## 2013-11-15 ENCOUNTER — Ambulatory Visit (HOSPITAL_COMMUNITY): Payer: Self-pay | Admitting: Psychiatry

## 2013-11-19 ENCOUNTER — Other Ambulatory Visit: Payer: Self-pay

## 2013-11-19 DIAGNOSIS — M25562 Pain in left knee: Secondary | ICD-10-CM

## 2013-11-19 MED ORDER — HYDROCODONE-ACETAMINOPHEN 10-325 MG PO TABS: ORAL_TABLET | ORAL | Status: DC

## 2013-11-23 ENCOUNTER — Other Ambulatory Visit: Payer: Self-pay | Admitting: Family Medicine

## 2013-11-26 DIAGNOSIS — Z1211 Encounter for screening for malignant neoplasm of colon: Secondary | ICD-10-CM | POA: Insufficient documentation

## 2013-11-26 DIAGNOSIS — Z0000 Encounter for general adult medical examination without abnormal findings: Secondary | ICD-10-CM | POA: Insufficient documentation

## 2013-11-26 NOTE — Assessment & Plan Note (Signed)
Medcation prescribed and pt referred for therapy She is not suicidal or homicidal

## 2013-11-26 NOTE — Assessment & Plan Note (Signed)
Heme negative stool, and no rectal/anal mass palpated

## 2013-11-26 NOTE — Assessment & Plan Note (Signed)
Worsened pain, pain contract entered for daily hydrocodone, pt also encouraged to work on weight loss

## 2013-11-26 NOTE — Assessment & Plan Note (Signed)

## 2013-11-29 ENCOUNTER — Telehealth: Payer: Self-pay

## 2013-11-29 ENCOUNTER — Ambulatory Visit (HOSPITAL_COMMUNITY): Payer: Self-pay | Admitting: Psychology

## 2013-11-29 NOTE — Telephone Encounter (Signed)
Has appt 12/11/2013 will address then

## 2013-11-29 NOTE — Telephone Encounter (Signed)
Since her mother died she has been feeling nervous and has knots in her stomach and everything she eats goes right through her (diarrhea several times daily when she eats) Has gotten worse over the past few weeks. Feels nauseated as well.  States she needs something for her stomach or her nerves. Please advise

## 2013-11-30 NOTE — Telephone Encounter (Signed)
Pt aware.

## 2013-12-11 ENCOUNTER — Ambulatory Visit: Payer: BC Managed Care – PPO | Admitting: Family Medicine

## 2013-12-13 ENCOUNTER — Other Ambulatory Visit: Payer: Self-pay

## 2013-12-13 DIAGNOSIS — M25562 Pain in left knee: Secondary | ICD-10-CM

## 2013-12-13 MED ORDER — HYDROCODONE-ACETAMINOPHEN 10-325 MG PO TABS: ORAL_TABLET | ORAL | Status: DC

## 2013-12-19 ENCOUNTER — Ambulatory Visit (INDEPENDENT_AMBULATORY_CARE_PROVIDER_SITE_OTHER): Payer: BC Managed Care – PPO | Admitting: Psychology

## 2013-12-19 DIAGNOSIS — F331 Major depressive disorder, recurrent, moderate: Secondary | ICD-10-CM

## 2013-12-20 ENCOUNTER — Encounter (HOSPITAL_COMMUNITY): Payer: Self-pay | Admitting: Psychiatry

## 2013-12-20 ENCOUNTER — Ambulatory Visit (INDEPENDENT_AMBULATORY_CARE_PROVIDER_SITE_OTHER): Payer: BC Managed Care – PPO | Admitting: Psychiatry

## 2013-12-20 VITALS — Ht 63.0 in | Wt 252.0 lb

## 2013-12-20 DIAGNOSIS — F332 Major depressive disorder, recurrent severe without psychotic features: Secondary | ICD-10-CM

## 2013-12-20 DIAGNOSIS — F331 Major depressive disorder, recurrent, moderate: Secondary | ICD-10-CM

## 2013-12-20 MED ORDER — DULOXETINE HCL 60 MG PO CPEP: 60.0000 mg | ORAL_CAPSULE | Freq: Every day | ORAL | Status: DC
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
## 2014-01-09 ENCOUNTER — Encounter (HOSPITAL_COMMUNITY): Payer: Self-pay | Admitting: Psychology
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
## 2014-01-11 ENCOUNTER — Other Ambulatory Visit: Payer: Self-pay

## 2014-01-11 DIAGNOSIS — M25562 Pain in left knee: Secondary | ICD-10-CM

## 2014-01-11 MED ORDER — HYDROCODONE-ACETAMINOPHEN 10-325 MG PO TABS: ORAL_TABLET | ORAL | Status: DC

## 2014-01-15 ENCOUNTER — Encounter: Payer: Self-pay | Admitting: Family Medicine

## 2014-01-16 ENCOUNTER — Ambulatory Visit (INDEPENDENT_AMBULATORY_CARE_PROVIDER_SITE_OTHER): Payer: BC Managed Care – PPO | Admitting: Psychiatry

## 2014-01-16 ENCOUNTER — Encounter (HOSPITAL_COMMUNITY): Payer: Self-pay | Admitting: Psychiatry

## 2014-01-16 VITALS — BP 112/66 | HR 78 | Ht 63.0 in | Wt 239.0 lb

## 2014-01-16 DIAGNOSIS — F332 Major depressive disorder, recurrent severe without psychotic features: Secondary | ICD-10-CM

## 2014-01-16 DIAGNOSIS — F331 Major depressive disorder, recurrent, moderate: Secondary | ICD-10-CM

## 2014-01-16 MED ORDER — DULOXETINE HCL 60 MG PO CPEP: 60.0000 mg | ORAL_CAPSULE | Freq: Every day | ORAL | Status: DC
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
## 2014-01-18 ENCOUNTER — Ambulatory Visit (HOSPITAL_COMMUNITY): Payer: Self-pay | Admitting: Psychology

## 2014-01-23 ENCOUNTER — Other Ambulatory Visit: Payer: Self-pay | Admitting: Family Medicine

## 2014-01-30 ENCOUNTER — Ambulatory Visit (INDEPENDENT_AMBULATORY_CARE_PROVIDER_SITE_OTHER): Payer: BC Managed Care – PPO | Admitting: Family Medicine

## 2014-01-30 ENCOUNTER — Ambulatory Visit (INDEPENDENT_AMBULATORY_CARE_PROVIDER_SITE_OTHER): Payer: BC Managed Care – PPO

## 2014-01-30 ENCOUNTER — Encounter: Payer: Self-pay | Admitting: Family Medicine

## 2014-01-30 VITALS — BP 148/94 | HR 90 | Resp 18 | Ht 63.0 in | Wt 250.0 lb

## 2014-01-30 DIAGNOSIS — Z23 Encounter for immunization: Secondary | ICD-10-CM

## 2014-01-30 DIAGNOSIS — E1169 Type 2 diabetes mellitus with other specified complication: Secondary | ICD-10-CM

## 2014-01-30 DIAGNOSIS — E559 Vitamin D deficiency, unspecified: Secondary | ICD-10-CM

## 2014-01-30 DIAGNOSIS — E119 Type 2 diabetes mellitus without complications: Secondary | ICD-10-CM

## 2014-01-30 DIAGNOSIS — F329 Major depressive disorder, single episode, unspecified: Secondary | ICD-10-CM

## 2014-01-30 DIAGNOSIS — E669 Obesity, unspecified: Secondary | ICD-10-CM

## 2014-01-30 DIAGNOSIS — M5410 Radiculopathy, site unspecified: Secondary | ICD-10-CM | POA: Insufficient documentation

## 2014-01-30 DIAGNOSIS — M549 Dorsalgia, unspecified: Secondary | ICD-10-CM

## 2014-01-30 DIAGNOSIS — E785 Hyperlipidemia, unspecified: Secondary | ICD-10-CM

## 2014-01-30 DIAGNOSIS — F32A Depression, unspecified: Secondary | ICD-10-CM

## 2014-01-30 DIAGNOSIS — I10 Essential (primary) hypertension: Secondary | ICD-10-CM

## 2014-01-30 DIAGNOSIS — M25562 Pain in left knee: Secondary | ICD-10-CM

## 2014-01-30 DIAGNOSIS — R7309 Other abnormal glucose: Secondary | ICD-10-CM

## 2014-01-30 MED ORDER — SPIRONOLACTONE 25 MG PO TABS: 25.0000 mg | ORAL_TABLET | Freq: Every day | ORAL | Status: DC

## 2014-01-30 MED ORDER — METHYLPREDNISOLONE ACETATE 80 MG/ML IJ SUSP
80.0000 mg | Freq: Once | INTRAMUSCULAR | Status: AC
  Administered 2014-01-30: 80 mg via INTRAMUSCULAR

## 2014-01-30 MED ORDER — KETOROLAC TROMETHAMINE 60 MG/2ML IM SOLN
60.0000 mg | Freq: Once | INTRAMUSCULAR | Status: AC
  Administered 2014-01-30: 60 mg via INTRAMUSCULAR

## 2014-01-30 MED ORDER — PREDNISONE (PAK) 10 MG PO TABS: ORAL_TABLET | Freq: Every day | ORAL | Status: DC

## 2014-01-30 MED ORDER — GABAPENTIN 300 MG PO CAPS: 300.0000 mg | ORAL_CAPSULE | Freq: Every day | ORAL | Status: DC

## 2014-01-30 NOTE — Patient Instructions (Addendum)
F/u in 2 month, call if you need me before  New additional medication, spironolactone is needed for uncontrolled blood pressure, continue the maxzide   Fasting labs today  New for pain is gabapentin at bedtime and short course of prednisone sent to pharmacy  Toradol and depomedrol in the office today for uncontrolled pain  Flu vaccine today  [facility]  I will refer you back to ortho re left knee due to report of increased pain and instability

## 2014-01-30 NOTE — Progress Notes (Signed)
   Subjective:    Patient ID: Tami Pearson, femRica Recordsale    DOB: 12/16/1965, 48 y.o.   MRN: [id number]  HPI  The PT is here for follow up and re-evaluation of chronic medical conditions, medication management and review of any available recent lab and radiology data.  Preventive health is updated, specifically  Cancer screening and Immunization.   . The PT denies any adverse reactions to current medications since the last visit.  2 month h/o uncontrolled bilateral anterior thigh pain to  Knee, right worse than left , rated at a 10,disturbs sleep, no weakness or numbness in thighs, chronic low back pain, no incontinence of stool or urine Increased left knee pain and instability, has appt with ortho in March but needs sooner eval Needs flu vaccine Still depressed about Mom's death but benefiting from psych treatment      Review of Systems See HPI Denies recent fever or chills. Denies sinus pressure, nasal congestion, ear pain or sore throat. Denies chest congestion, productive cough or wheezing. Denies chest pains, palpitations and leg swelling Denies abdominal pain, nausea, vomiting,diarrhea or constipation.   Denies dysuria, frequency, hesitancy or incontinence.  Denies headaches, seizures,   Denies skin break down or rash.        Objective:   Physical Exam BP 148/94 mmHg  Pulse 90  Resp 18  Ht 5\' 3"  (1.6 m)  Wt 250 lb 0.6 oz (113.417 kg)  BMI 44.30 kg/m2  SpO2 96% Patient alert and oriented and in no cardiopulmonary distress.Pt in pain HEENT: No facial asymmetry, EOMI,   oropharynx pink and moist.  Neck supple no JVD, no mass.  Chest: Clear to auscultation bilaterally.  CVS: S1, S2 no murmurs, no S3.Regular rate.  ABD: Soft non tender.   Ext: No edema  MS: decreased ROM spine,  hips and knees.Left knee swollen and  deformed with limitation in mobility and crepitus  Skin: Intact, no ulcerations or rash noted.  Psych: Good eye contact, normal affect. Memory  intact not anxious mildly depressed appearing.  CNS: CN 2-12 intact, power,  normal throughout.no focal deficits noted.        Assessment & Plan:  Back pain with radiation Uncontrolled, worsened, tanti inflammatory injection in office , also pt to start gabapentin, reports hydrocodone not helping pain. Needs x ray of lumbar spine, pain described seems neurogenic in nature  Morbid obesity Unchanged Patient re-educated about  the importance of commitment to a  minimum of 150 minutes of exercise per week. The importance of healthy food choices with portion control discussed. Encouraged to start a food diary, count calories and to consider  joining a support group. Sample diet sheets offered. Goals set by the patient for the next several months.     Depression Being treated by psychiatry and has improved, though still experiencing grief at her Mom's passing  Diabetes mellitus type 2 in obese No med change, pt needs to follow diabetic diet and work on weight loss  Essential hypertension UnControlled,  spironolactone added  Hyperlipidemia LDL goal <100 Elevated and uncontrolled Hyperlipidemia:Low fat diet discussed and encouraged.  Needs statin therapy, will need to be contacted about this as labs not available at time of visit  Need for prophylactic vaccination and inoculation against influenza Vaccine administered at visit.

## 2014-01-31 DIAGNOSIS — E559 Vitamin D deficiency, unspecified: Secondary | ICD-10-CM | POA: Insufficient documentation

## 2014-01-31 LAB — COMPLETE METABOLIC PANEL WITH GFR
ALBUMIN: 3.8 g/dL (ref 3.5–5.2)
ALK PHOS: 65 U/L (ref 39–117)
ALT: 12 U/L (ref 0–35)
AST: 20 U/L (ref 0–37)
BUN: 22 mg/dL (ref 6–23)
CALCIUM: 9.5 mg/dL (ref 8.4–10.5)
CO2: 26 mEq/L (ref 19–32)
CREATININE: 0.87 mg/dL (ref 0.50–1.10)
Chloride: 104 mEq/L (ref 96–112)
GFR, Est African American: >89 mL/min
GFR, Est Non African American: 78 mL/min
Glucose, Bld: 92 mg/dL (ref 70–99)
Potassium: 4.1 mEq/L (ref 3.5–5.3)
Sodium: 140 mEq/L (ref 135–145)
Total Bilirubin: 0.2 mg/dL (ref 0.2–1.2)
Total Protein: 8.3 g/dL (ref 6.0–8.3)

## 2014-01-31 LAB — LIPID PANEL
CHOL/HDL RATIO: 4.5 ratio
CHOLESTEROL: 202 mg/dL — AB (ref 0–200)
HDL: 45 mg/dL (ref >39)
LDL Cholesterol: 129 mg/dL — ABNORMAL HIGH (ref 0–99)
TRIGLYCERIDES: 142 mg/dL (ref <150)
VLDL: 28 mg/dL (ref 0–40)

## 2014-01-31 LAB — VITAMIN D 25 HYDROXY (VIT D DEFICIENCY, FRACTURES): Vit D, 25-Hydroxy: 15 ng/mL — ABNORMAL LOW (ref 30–100)

## 2014-01-31 LAB — HEMOGLOBIN A1C
HEMOGLOBIN A1C: 6.4 % — AB (ref <5.7)
Mean Plasma Glucose: 137 mg/dL — ABNORMAL HIGH (ref <117)

## 2014-01-31 LAB — TSH: TSH: 1.614 u[IU]/mL (ref 0.350–4.500)

## 2014-02-02 DIAGNOSIS — Z23 Encounter for immunization: Secondary | ICD-10-CM | POA: Insufficient documentation

## 2014-02-02 NOTE — Assessment & Plan Note (Signed)
Elevated and uncontrolled Hyperlipidemia:Low fat diet discussed and encouraged.  Needs statin therapy, will need to be contacted about this as labs not available at time of visit

## 2014-02-02 NOTE — Assessment & Plan Note (Signed)
Unchanged. Patient re-educated about  the importance of commitment to a  minimum of 150 minutes of exercise per week. The importance of healthy food choices with portion control discussed. Encouraged to start a food diary, count calories and to consider  joining a support group. Sample diet sheets offered. Goals set by the patient for the next several months.    

## 2014-02-02 NOTE — Assessment & Plan Note (Signed)
Uncontrolled, worsened, tanti inflammatory injection in office , also pt to start gabapentin, reports hydrocodone not helping pain. Needs x ray of lumbar spine, pain described seems neurogenic in nature

## 2014-02-02 NOTE — Assessment & Plan Note (Signed)
Being treated by psychiatry and has improved, though still experiencing grief at her Mom's passing

## 2014-02-02 NOTE — Assessment & Plan Note (Signed)
Vaccine administered at visit.  

## 2014-02-02 NOTE — Assessment & Plan Note (Signed)
No med change, pt needs to follow diabetic diet and work on weight loss

## 2014-02-02 NOTE — Assessment & Plan Note (Signed)
Increased and uncontrolled,  Marked limitation in mobility. Pt reports high fall risk, is scheduled to see ortho in march, however due to symptom seberity will refer for sooner re eval

## 2014-02-02 NOTE — Assessment & Plan Note (Addendum)
UnControlled,  spironolactone added

## 2014-02-05 ENCOUNTER — Telehealth: Payer: Self-pay | Admitting: Family Medicine

## 2014-02-06 ENCOUNTER — Other Ambulatory Visit: Payer: Self-pay

## 2014-02-06 DIAGNOSIS — E669 Obesity, unspecified: Secondary | ICD-10-CM

## 2014-02-06 DIAGNOSIS — E785 Hyperlipidemia, unspecified: Secondary | ICD-10-CM

## 2014-02-06 DIAGNOSIS — E1169 Type 2 diabetes mellitus with other specified complication: Secondary | ICD-10-CM

## 2014-02-06 DIAGNOSIS — E559 Vitamin D deficiency, unspecified: Secondary | ICD-10-CM

## 2014-02-06 MED ORDER — ERGOCALCIFEROL 1.25 MG (50000 UT) PO CAPS: 50000.0000 [IU] | ORAL_CAPSULE | ORAL | Status: DC

## 2014-02-06 MED ORDER — PRAVASTATIN SODIUM 20 MG PO TABS
20.0000 mg | ORAL_TABLET | Freq: Every day | ORAL | Status: DC
Start: 2014-02-06 — End: 2014-06-17

## 2014-02-06 NOTE — Addendum Note (Signed)
Addended by: Kandis FantasiaSLADE, COURTNEY B on: 02/06/2014 09:58 AM   Modules accepted: Orders

## 2014-02-06 NOTE — Telephone Encounter (Signed)
noted 

## 2014-02-08 ENCOUNTER — Other Ambulatory Visit: Payer: Self-pay

## 2014-02-08 DIAGNOSIS — M25562 Pain in left knee: Secondary | ICD-10-CM

## 2014-02-08 MED ORDER — HYDROCODONE-ACETAMINOPHEN 10-325 MG PO TABS: ORAL_TABLET | ORAL | Status: DC

## 2014-02-22 ENCOUNTER — Ambulatory Visit (HOSPITAL_COMMUNITY): Payer: Self-pay | Admitting: Psychology

## 2014-02-27 ENCOUNTER — Ambulatory Visit (HOSPITAL_COMMUNITY): Payer: Self-pay | Admitting: Psychiatry

## 2014-02-28 ENCOUNTER — Telehealth (HOSPITAL_COMMUNITY): Payer: Self-pay | Admitting: *Deleted

## 2014-03-14 ENCOUNTER — Other Ambulatory Visit: Payer: Self-pay

## 2014-03-14 DIAGNOSIS — M25562 Pain in left knee: Secondary | ICD-10-CM

## 2014-03-14 MED ORDER — HYDROCODONE-ACETAMINOPHEN 10-325 MG PO TABS: ORAL_TABLET | ORAL | Status: DC

## 2014-03-18 ENCOUNTER — Ambulatory Visit (HOSPITAL_COMMUNITY): Payer: Self-pay | Admitting: Psychiatry

## 2014-03-19 ENCOUNTER — Ambulatory Visit (HOSPITAL_COMMUNITY): Payer: Self-pay | Admitting: Psychology

## 2014-03-26 ENCOUNTER — Encounter (HOSPITAL_COMMUNITY): Payer: Self-pay | Admitting: Psychiatry

## 2014-03-26 ENCOUNTER — Ambulatory Visit (INDEPENDENT_AMBULATORY_CARE_PROVIDER_SITE_OTHER): Payer: BLUE CROSS/BLUE SHIELD | Admitting: Psychiatry

## 2014-03-26 VITALS — BP 131/90 | HR 82 | Ht 63.0 in | Wt 243.2 lb

## 2014-03-26 DIAGNOSIS — F331 Major depressive disorder, recurrent, moderate: Secondary | ICD-10-CM

## 2014-03-26 MED ORDER — DULOXETINE HCL 60 MG PO CPEP: 60.0000 mg | ORAL_CAPSULE | Freq: Every day | ORAL | Status: DC
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
## 2014-04-03 ENCOUNTER — Ambulatory Visit (HOSPITAL_COMMUNITY): Payer: Self-pay | Admitting: Psychology

## 2014-04-12 ENCOUNTER — Other Ambulatory Visit: Payer: Self-pay

## 2014-04-12 DIAGNOSIS — M25562 Pain in left knee: Secondary | ICD-10-CM

## 2014-04-12 MED ORDER — HYDROCODONE-ACETAMINOPHEN 10-325 MG PO TABS: ORAL_TABLET | ORAL | Status: DC

## 2014-04-16 ENCOUNTER — Encounter: Payer: Self-pay | Admitting: Family Medicine

## 2014-04-16 ENCOUNTER — Ambulatory Visit (INDEPENDENT_AMBULATORY_CARE_PROVIDER_SITE_OTHER): Payer: BLUE CROSS/BLUE SHIELD | Admitting: Family Medicine

## 2014-04-16 VITALS — BP 120/84 | HR 84 | Resp 18 | Ht 63.0 in | Wt 227.0 lb

## 2014-04-16 DIAGNOSIS — E1169 Type 2 diabetes mellitus with other specified complication: Secondary | ICD-10-CM

## 2014-04-16 DIAGNOSIS — E119 Type 2 diabetes mellitus without complications: Secondary | ICD-10-CM

## 2014-04-16 DIAGNOSIS — E785 Hyperlipidemia, unspecified: Secondary | ICD-10-CM

## 2014-04-16 DIAGNOSIS — I10 Essential (primary) hypertension: Secondary | ICD-10-CM

## 2014-04-16 DIAGNOSIS — H543 Unqualified visual loss, both eyes: Secondary | ICD-10-CM

## 2014-04-16 DIAGNOSIS — J209 Acute bronchitis, unspecified: Secondary | ICD-10-CM

## 2014-04-16 DIAGNOSIS — E559 Vitamin D deficiency, unspecified: Secondary | ICD-10-CM

## 2014-04-16 DIAGNOSIS — F32A Depression, unspecified: Secondary | ICD-10-CM

## 2014-04-16 DIAGNOSIS — Z1159 Encounter for screening for other viral diseases: Secondary | ICD-10-CM

## 2014-04-16 DIAGNOSIS — E669 Obesity, unspecified: Secondary | ICD-10-CM

## 2014-04-16 DIAGNOSIS — F329 Major depressive disorder, single episode, unspecified: Secondary | ICD-10-CM

## 2014-04-16 DIAGNOSIS — M549 Dorsalgia, unspecified: Secondary | ICD-10-CM

## 2014-04-16 MED ORDER — AZITHROMYCIN 250 MG PO TABS: ORAL_TABLET | ORAL | Status: DC

## 2014-04-16 MED ORDER — BENZONATATE 100 MG PO CAPS: 100.0000 mg | ORAL_CAPSULE | Freq: Three times a day (TID) | ORAL | Status: DC | PRN

## 2014-04-16 MED ORDER — PROMETHAZINE-DM 6.25-15 MG/5ML PO SYRP: ORAL_SOLUTION | ORAL | Status: DC

## 2014-04-16 NOTE — Progress Notes (Signed)
Subjective:    Patient ID: Rica RecordsAngeline L Pearson, female    DOB: 06/29/1965, 49 y.o.   MRN: [id number]  HPI The PT is here for follow up and re-evaluation of chronic medical conditions, medication management and review of any available recent lab and radiology data.  Preventive health is updated, specifically  Cancer screening and Immunization.   Questions or concerns regarding consultations or procedures which the PT has had in the interim are  addressed. 1 week h/o yellow  Sputum form chest cold and congestion, no fevr, some chills, no URI symptoms, sick contact in the hime  Left knee buckles, no immediate plan for surgery per pt, hips are good and pain managemnt good  Denies polyuria, polydipsia, blurred vision , or hypoglycemic episodes. Excellent weight loss by dietary modification     Review of Systems See HPI`  Denies sinus pressure, nasal congestion, ear pain or sore throat. Denies chest pains, palpitations and leg swelling Denies abdominal pain, nausea, vomiting,diarrhea or constipation.   Denies dysuria, frequency, hesitancy or incontinence. Chronic spine and joint pain with some instability in knee that is for surgery in the future. Denies headaches, seizures, numbness, or tingling. Denies depression, anxiety or insomnia. Denies skin break down or rash.        Objective:   Physical Exam BP 120/84 mmHg  Pulse 84  Resp 18  Ht 5\' 3"  (1.6 m)  Wt 227 lb (102.967 kg)  BMI 40.22 kg/m2  SpO2 98% Patient alert and oriented and in no cardiopulmonary distress.  HEENT: No facial asymmetry, EOMI,   oropharynx pink and moist.  Neck supple no JVD, no mass.  Chest: decreased air entry, bilateral crackles, no wheezes  CVS: S1, S2 no murmurs, no S3.Regular rate.  ABD: Soft non tender.   Ext: No edema  MS: Adequate though reduced  ROM spine, shoulders, hips and knees.  Skin: Intact, no ulcerations or rash noted.  Psych: Good eye contact, normal affect. Memory intact  not anxious or depressed appearing.  CNS: CN 2-12 intact, power,  normal throughout.no focal deficits noted.        Assessment & Plan:  Acute bronchitis Antibiotic and decongestant prescribed.    Essential hypertension Controlled, no change in medication DASH diet and commitment to daily physical activity for a minimum of 30 minutes discussed and encouraged, as a part of hypertension management.   BP/Weight 04/16/2014 01/30/2014 08/20/2013 04/07/2013 04/04/2013 04/03/2013 02/19/2013  Systolic BP 120 148 138 119 - [id number] 124  Diastolic BP 84 94 92 80 - 99 84  Wt. (Lbs) 227 250.04 246 - 242 242.13 240  BMI 40.22 44.3 42.89 - 42.88 42.9 42.52  Some encounter information is confidential and restricted. Go to Review Flowsheets activity to see all data.        Morbid obesity Improved, pt congratulated  Patient re-educated about  the importance of commitment to a  minimum of 150 minutes of exercise per week.  The importance of healthy food choices with portion control discussed. Encouraged to start a food diary, count calories and to consider  joining a support group. Sample diet sheets offered. Goals set by the patient for the next several months.   Weight /BMI 04/16/2014 01/30/2014 08/20/2013  WEIGHT 227 lb 250 lb 0.6 oz 246 lb  HEIGHT 5\' 3"  5\' 3"  5' 3.5"  BMI 40.22 kg/m2 44.3 kg/m2 42.89 kg/m2  Some encounter information is confidential and restricted. Go to Review Flowsheets activity to see all data.    Current exercise  per week 90 minutes.Limited due to arthritis   Diabetes mellitus type 2 in obese Controlled, no change in medication Updated lab needed at/ before next visit. Patient educated about the importance of limiting  Carbohydrate intake , the need to commit to daily physical activity for a minimum of 30 minutes , and to commit weight loss. The fact that changes in all these areas will reduce or eliminate all together the development of diabetes is stressed.   Diabetic  Labs Latest Ref Rng 04/16/2014 01/30/2014 08/20/2013 04/06/2013 04/05/2013  HbA1c <5.7 % - 6.4(H) 6.4(H) - -  Microalbumin <2.0 mg/dL 1.1 - - - -  Micro/Creat Ratio 0.0 - 30.0 mg/g 7.9 - - - -  Chol 0 - 200 mg/dL - 161(W) - - -  HDL >96 mg/dL - 45 - - -  Calc LDL 0 - 99 mg/dL - 045(W) - - -  Triglycerides <150 mg/dL - 098 - - -  Creatinine 0.50 - 1.10 mg/dL - 1.19 1.47 8.29 5.62   BP/Weight 04/16/2014 01/30/2014 08/20/2013 04/07/2013 04/04/2013 04/03/2013 02/19/2013  Systolic BP 120 148 138 119 - 130 124  Diastolic BP 84 94 92 80 - 99 84  Wt. (Lbs) 227 250.04 246 - 242 242.13 240  BMI 40.22 44.3 42.89 - 42.88 42.9 42.52  Some encounter information is confidential and restricted. Go to Review Flowsheets activity to see all data.   Foot/eye exam completion dates 04/16/2014  Foot Form Completion Done       Hyperlipidemia LDL goal <100 Hyperlipidemia:Low fat diet discussed and encouraged. Statin indicated but pt prefers to hold off Updated lab needed at/ before next visit. l    Lipid Panel  Lab Results  Component Value Date   CHOL 202* 01/30/2014   HDL 45 01/30/2014   LDLCALC 129* 01/30/2014   TRIG 142 01/30/2014   CHOLHDL 4.5 01/30/2014   Uncontrolled, statin indicated      Depression Improving, treated by psychiatry  Back pain with radiation Chronic pain management to continue as before

## 2014-04-16 NOTE — Patient Instructions (Addendum)
F/u  early June, call if you need me before  You are treated for acute bronchitis  You are referred for eye exam which you need and is overdue  Fasting lipid, cmp and EGFr, HBA1C Vit D and HIV test prr to f/u   Congrats on weight loss keep it up

## 2014-04-17 LAB — MICROALBUMIN / CREATININE URINE RATIO
Creatinine, Urine: 139.8 mg/dL
MICROALB/CREAT RATIO: 7.9 mg/g (ref 0.0–30.0)
Microalb, Ur: 1.1 mg/dL (ref <2.0)

## 2014-05-01 ENCOUNTER — Other Ambulatory Visit: Payer: Self-pay | Admitting: Family Medicine

## 2014-05-07 ENCOUNTER — Ambulatory Visit (INDEPENDENT_AMBULATORY_CARE_PROVIDER_SITE_OTHER): Payer: BLUE CROSS/BLUE SHIELD | Admitting: Psychology

## 2014-05-07 DIAGNOSIS — F331 Major depressive disorder, recurrent, moderate: Secondary | ICD-10-CM | POA: Diagnosis not present

## 2014-05-09 ENCOUNTER — Telehealth: Payer: Self-pay

## 2014-05-09 ENCOUNTER — Ambulatory Visit (INDEPENDENT_AMBULATORY_CARE_PROVIDER_SITE_OTHER): Payer: Self-pay

## 2014-05-09 DIAGNOSIS — M25562 Pain in left knee: Secondary | ICD-10-CM

## 2014-05-09 MED ORDER — METHYLPREDNISOLONE ACETATE 80 MG/ML IJ SUSP
80.0000 mg | Freq: Once | INTRAMUSCULAR | Status: AC
  Administered 2014-05-09: 80 mg via INTRAMUSCULAR

## 2014-05-09 MED ORDER — KETOROLAC TROMETHAMINE 60 MG/2ML IM SOLN
60.0000 mg | Freq: Once | INTRAMUSCULAR | Status: AC
  Administered 2014-05-09: 60 mg via INTRAMUSCULAR

## 2014-05-09 NOTE — Progress Notes (Signed)
Left knee pain x chronic but worsening in the past 2 days. Rates pain at a 9. Ok to get t60 and d80 per Dr Lodema HongSimpson. Injections given with no complications

## 2014-05-09 NOTE — Telephone Encounter (Signed)
Noted.  Verified with patient that she will be in.

## 2014-05-09 NOTE — Telephone Encounter (Signed)
OK to come today for torasdol 60 mg Im ansd depo medrol 80 mg IM spoke with pt , states she wioll be in area around 3 to 3:30 pls verify with her if this is OK with you or change the time

## 2014-05-10 ENCOUNTER — Other Ambulatory Visit: Payer: Self-pay

## 2014-05-10 DIAGNOSIS — M25562 Pain in left knee: Secondary | ICD-10-CM

## 2014-05-10 MED ORDER — HYDROCODONE-ACETAMINOPHEN 10-325 MG PO TABS: ORAL_TABLET | ORAL | Status: DC

## 2014-05-23 ENCOUNTER — Encounter (HOSPITAL_COMMUNITY): Payer: Self-pay | Admitting: Psychology
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
## 2014-05-24 ENCOUNTER — Ambulatory Visit (INDEPENDENT_AMBULATORY_CARE_PROVIDER_SITE_OTHER): Payer: BLUE CROSS/BLUE SHIELD | Admitting: Psychiatry

## 2014-05-24 ENCOUNTER — Encounter (HOSPITAL_COMMUNITY): Payer: Self-pay | Admitting: Psychiatry

## 2014-05-24 VITALS — BP 127/95 | HR 67 | Ht 63.0 in | Wt 246.0 lb

## 2014-05-24 DIAGNOSIS — F331 Major depressive disorder, recurrent, moderate: Secondary | ICD-10-CM

## 2014-05-24 MED ORDER — DULOXETINE HCL 60 MG PO CPEP: 60.0000 mg | ORAL_CAPSULE | Freq: Every day | ORAL | Status: DC
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
## 2014-06-05 ENCOUNTER — Ambulatory Visit (HOSPITAL_COMMUNITY): Payer: Self-pay | Admitting: Psychology

## 2014-06-06 ENCOUNTER — Other Ambulatory Visit: Payer: Self-pay | Admitting: Family Medicine

## 2014-06-06 ENCOUNTER — Other Ambulatory Visit: Payer: Self-pay

## 2014-06-06 DIAGNOSIS — M25562 Pain in left knee: Secondary | ICD-10-CM

## 2014-06-06 MED ORDER — HYDROCODONE-ACETAMINOPHEN 10-325 MG PO TABS: ORAL_TABLET | ORAL | Status: DC

## 2014-06-10 ENCOUNTER — Encounter (HOSPITAL_COMMUNITY): Payer: Self-pay | Admitting: *Deleted

## 2014-06-10 ENCOUNTER — Ambulatory Visit (HOSPITAL_COMMUNITY): Payer: Self-pay | Admitting: Psychology

## 2014-06-17 ENCOUNTER — Other Ambulatory Visit: Payer: Self-pay

## 2014-06-17 MED ORDER — LISINOPRIL-HYDROCHLOROTHIAZIDE 10-12.5 MG PO TABS: 1.0000 | ORAL_TABLET | Freq: Every day | ORAL | Status: DC

## 2014-06-17 MED ORDER — ROSUVASTATIN CALCIUM 10 MG PO TABS: 10.0000 mg | ORAL_TABLET | Freq: Every day | ORAL | Status: DC

## 2014-06-17 MED ORDER — VITAMIN D (ERGOCALCIFEROL) 1.25 MG (50000 UNIT) PO CAPS: 50000.0000 [IU] | ORAL_CAPSULE | ORAL | Status: DC

## 2014-06-18 ENCOUNTER — Other Ambulatory Visit: Payer: Self-pay

## 2014-06-18 MED ORDER — HYDROXYZINE HCL 50 MG PO TABS: 50.0000 mg | ORAL_TABLET | Freq: Every day | ORAL | Status: DC

## 2014-06-19 ENCOUNTER — Ambulatory Visit (HOSPITAL_COMMUNITY): Payer: Self-pay | Admitting: Psychology

## 2014-06-27 ENCOUNTER — Telehealth: Payer: Self-pay | Admitting: Family Medicine

## 2014-06-27 NOTE — Telephone Encounter (Signed)
Patient called to cancel appointment for June 2016 because her insurance has been terminated and she is going to apply for medicaid and wanted to know about getting her pain Rx I asked Merry ProudBrandi and she stated that patient needs to be seen every 4 months and patients last appt was 3.2016 so she will need to come back in July 2016 patient is aware of this.

## 2014-07-03 ENCOUNTER — Other Ambulatory Visit: Payer: Self-pay

## 2014-07-03 DIAGNOSIS — M25562 Pain in left knee: Secondary | ICD-10-CM

## 2014-07-03 MED ORDER — HYDROCODONE-ACETAMINOPHEN 10-325 MG PO TABS: ORAL_TABLET | ORAL | Status: DC

## 2014-07-12 ENCOUNTER — Ambulatory Visit (HOSPITAL_COMMUNITY): Payer: Self-pay | Admitting: Psychology

## 2014-07-15 NOTE — Assessment & Plan Note (Addendum)
Improved, pt congratulated  Patient re-educated about  the importance of commitment to a  minimum of 150 minutes of exercise per week.  The importance of healthy food choices with portion control discussed. Encouraged to start a food diary, count calories and to consider  joining a support group. Sample diet sheets offered. Goals set by the patient for the next several months.   Weight /BMI 04/16/2014 01/30/2014 08/20/2013  WEIGHT 227 lb 250 lb 0.6 oz 246 lb  HEIGHT 5\' 3"  5\' 3"  5' 3.5"  BMI 40.22 kg/m2 44.3 kg/m2 42.89 kg/m2  Some encounter information is confidential and restricted. Go to Review Flowsheets activity to see all data.    Current exercise per week 90 minutes.Limited due to arthritis

## 2014-07-15 NOTE — Assessment & Plan Note (Signed)
Controlled, no change in medication DASH diet and commitment to daily physical activity for a minimum of 30 minutes discussed and encouraged, as a part of hypertension management.   BP/Weight 04/16/2014 01/30/2014 08/20/2013 04/07/2013 04/04/2013 04/03/2013 02/19/2013  Systolic BP 120 148 138 119 - 828 124  Diastolic BP 84 94 92 80 - 99 84  Wt. (Lbs) 227 250.04 246 - 242 242.13 240  BMI 40.22 44.3 42.89 - 42.88 42.9 42.52  Some encounter information is confidential and restricted. Go to Review Flowsheets activity to see all data.

## 2014-07-15 NOTE — Assessment & Plan Note (Signed)
Improving, treated by psychiatry

## 2014-07-15 NOTE — Assessment & Plan Note (Addendum)
Hyperlipidemia:Low fat diet discussed and encouraged. Statin indicated but pt prefers to hold off Updated lab needed at/ before next visit. l    Lipid Panel  Lab Results  Component Value Date   CHOL 202* 01/30/2014   HDL 45 01/30/2014   LDLCALC 129* 01/30/2014   TRIG 142 01/30/2014   CHOLHDL 4.5 01/30/2014   Uncontrolled, statin indicated

## 2014-07-15 NOTE — Assessment & Plan Note (Signed)
Antibiotic and decongestant prescribed 

## 2014-07-15 NOTE — Assessment & Plan Note (Signed)
Chronic pain management to continue as before

## 2014-07-15 NOTE — Assessment & Plan Note (Signed)
Controlled, no change in medication Updated lab needed at/ before next visit. Patient educated about the importance of limiting  Carbohydrate intake , the need to commit to daily physical activity for a minimum of 30 minutes , and to commit weight loss. The fact that changes in all these areas will reduce or eliminate all together the development of diabetes is stressed.   Diabetic Labs Latest Ref Rng 04/16/2014 01/30/2014 08/20/2013 04/06/2013 04/05/2013  HbA1c <5.7 % - 6.4(H) 6.4(H) - -  Microalbumin <2.0 mg/dL 1.1 - - - -  Micro/Creat Ratio 0.0 - 30.0 mg/g 7.9 - - - -  Chol 0 - 200 mg/dL - 025(K) - - -  HDL >27 mg/dL - 45 - - -  Calc LDL 0 - 99 mg/dL - 062(B) - - -  Triglycerides <150 mg/dL - 762 - - -  Creatinine 0.50 - 1.10 mg/dL - 8.31 5.17 6.16 0.73   BP/Weight 04/16/2014 01/30/2014 08/20/2013 04/07/2013 04/04/2013 04/03/2013 02/19/2013  Systolic BP 120 148 138 119 - 710 124  Diastolic BP 84 94 92 80 - 99 84  Wt. (Lbs) 227 250.04 246 - 242 242.13 240  BMI 40.22 44.3 42.89 - 42.88 42.9 42.52  Some encounter information is confidential and restricted. Go to Review Flowsheets activity to see all data.   Foot/eye exam completion dates 04/16/2014  Foot Form Completion Done

## 2014-07-17 ENCOUNTER — Ambulatory Visit: Payer: Self-pay | Admitting: Family Medicine

## 2014-07-31 ENCOUNTER — Other Ambulatory Visit: Payer: Self-pay

## 2014-07-31 MED ORDER — HYDROXYZINE HCL 50 MG PO TABS: 50.0000 mg | ORAL_TABLET | Freq: Every day | ORAL | Status: DC

## 2014-08-02 ENCOUNTER — Other Ambulatory Visit: Payer: Self-pay

## 2014-08-02 DIAGNOSIS — M25562 Pain in left knee: Secondary | ICD-10-CM

## 2014-08-02 MED ORDER — HYDROCODONE-ACETAMINOPHEN 10-325 MG PO TABS
ORAL_TABLET | ORAL | Status: DC
Start: 2014-08-02 — End: 2014-09-06

## 2014-08-23 ENCOUNTER — Ambulatory Visit (HOSPITAL_COMMUNITY): Payer: Self-pay | Admitting: Psychiatry

## 2014-08-28 ENCOUNTER — Encounter: Payer: Self-pay | Admitting: Family Medicine

## 2014-08-28 ENCOUNTER — Ambulatory Visit (INDEPENDENT_AMBULATORY_CARE_PROVIDER_SITE_OTHER): Payer: Self-pay | Admitting: Family Medicine

## 2014-08-28 VITALS — BP 128/90 | HR 88 | Resp 16 | Ht 63.0 in | Wt 258.0 lb

## 2014-08-28 DIAGNOSIS — F32A Depression, unspecified: Secondary | ICD-10-CM

## 2014-08-28 DIAGNOSIS — E1169 Type 2 diabetes mellitus with other specified complication: Secondary | ICD-10-CM

## 2014-08-28 DIAGNOSIS — E785 Hyperlipidemia, unspecified: Secondary | ICD-10-CM

## 2014-08-28 DIAGNOSIS — Z113 Encounter for screening for infections with a predominantly sexual mode of transmission: Secondary | ICD-10-CM

## 2014-08-28 DIAGNOSIS — M549 Dorsalgia, unspecified: Secondary | ICD-10-CM

## 2014-08-28 DIAGNOSIS — E669 Obesity, unspecified: Secondary | ICD-10-CM

## 2014-08-28 DIAGNOSIS — F329 Major depressive disorder, single episode, unspecified: Secondary | ICD-10-CM

## 2014-08-28 DIAGNOSIS — I10 Essential (primary) hypertension: Secondary | ICD-10-CM

## 2014-08-28 DIAGNOSIS — E119 Type 2 diabetes mellitus without complications: Secondary | ICD-10-CM

## 2014-08-28 MED ORDER — GABAPENTIN 300 MG PO CAPS: ORAL_CAPSULE | ORAL | Status: DC

## 2014-08-28 NOTE — Progress Notes (Signed)
Subjective:    Patient ID: Tami Pearson, female    DOB: July 03, 1965, 49 y.o.   MRN: [id number]  HPI   Tami Pearson     MRN: [id number]      DOB: [birth year]   HPI Tami Pearson is here for follow up and re-evaluation of chronic medical conditions, medication management and review of any available recent lab and radiology data.  Preventive health is updated, specifically  Cancer screening and Immunization.  Currently uninsured, unable to do necessary tests at this time C/o increased back pain C/o increased depression, not suicidal or homicidal, unable to get therapy which was helpful C/o weight gain , overeating and reduced activity  ROS Denies recent fever or chills. Denies sinus pressure, nasal congestion, ear pain or sore throat. Denies chest congestion, productive cough or wheezing. Denies chest pains, palpitations and leg swelling Denies abdominal pain, nausea, vomiting,diarrhea or constipation.   Denies dysuria, frequency, hesitancy or incontinence. . Denies headaches, seizures, numbness, or tingling. Denies skin break down or rash.   PE  BP 128/90 mmHg  Pulse 88  Resp 16  Ht 5\' 3"  (1.6 m)  Wt 258 lb (117.028 kg)  BMI 45.71 kg/m2  SpO2 99%  Patient alert and oriented and in no cardiopulmonary distress.  HEENT: No facial asymmetry, EOMI,   oropharynx pink and moist.  Neck supple no JVD, no mass.  Chest: Clear to auscultation bilaterally.  CVS: S1, S2 no murmurs, no S3.Regular rate.  ABD: Soft non tender.   Ext: No edema  MS: Decreased ROM spine, shoulders, hips and knees.  Skin: Intact, no ulcerations or rash noted.  Psych: Good eye contact, flat and tearful  affect. Memory intact not anxious but  depressed appearing.  CNS: CN 2-12 intact, power,  normal throughout.no focal deficits noted.   Assessment & Plan   Back pain with radiation Increased and uncontrolled with weight gain and inactivity Increase dose of gabapentin, work on weight  loss, good back hygiene and increased activity as able   Essential hypertension Elevated diastolic, no med change at this tinme DASH diet and commitment to daily physical activity for a minimum of 30 minutes discussed and encouraged, as a part of hypertension management. The importance of attaining a healthy weight is also discussed.  BP/Weight 08/28/2014 04/16/2014 01/30/2014 08/20/2013 04/07/2013 04/04/2013 04/03/2013  Systolic BP 128 120 148 138 119 - 135  Diastolic BP 90 84 94 92 80 - 99  Wt. (Lbs) 258 227 250.04 246 - 242 242.13  BMI 45.71 40.22 44.3 42.89 - 42.88 42.9  Some encounter information is confidential and restricted. Go to Review Flowsheets activity to see all data.        Diabetes mellitus type 2 in obese Tami Pearson is reminded of the importance of commitment to daily physical activity for 30 minutes or more, as able and the need to limit carbohydrate intake to 30 to 60 grams per meal to help with blood sugar control.   The need to take medication as prescribed, test blood sugar as directed, and to call between visits if there is a concern that blood sugar is uncontrolled is also discussed.   Tami Pearson is reminded of the importance of daily foot exam, annual eye examination, and good blood sugar, blood pressure and cholesterol control.  Diabetic Labs Latest Ref Rng 04/16/2014 01/30/2014 08/20/2013 04/06/2013 04/05/2013  HbA1c <5.7 % - 6.4(H) 6.4(H) - -  Microalbumin <2.0 mg/dL 1.1 - - - -  Micro/Creat Ratio 0.0 -  30.0 mg/g 7.9 - - - -  Chol 0 - 200 mg/dL - 161(W) - - -  HDL >96 mg/dL - 45 - - -  Calc LDL 0 - 99 mg/dL - 045(W) - - -  Triglycerides <150 mg/dL - 098 - - -  Creatinine 0.50 - 1.10 mg/dL - 1.19 1.47 8.29 5.62   BP/Weight 08/28/2014 04/16/2014 01/30/2014 08/20/2013 04/07/2013 04/04/2013 04/03/2013  Systolic BP 128 120 148 138 119 - 135  Diastolic BP 90 84 94 92 80 - 99  Wt. (Lbs) 258 227 250.04 246 - 242 242.13  BMI 45.71 40.22 44.3 42.89 - 42.88 42.9  Some encounter  information is confidential and restricted. Go to Review Flowsheets activity to see all data.   Foot/eye exam completion dates 04/16/2014  Foot Form Completion Done   Will need to update lab and eye exam when pt has insurance      Morbid obesity Deteriorated. Patient re-educated about  the importance of commitment to a  minimum of 150 minutes of exercise per week.  The importance of healthy food choices with portion control discussed. Encouraged to start a food diary, count calories and to consider  joining a support group. Sample diet sheets offered. Goals set by the patient for the next several months.   Weight /BMI 08/28/2014 04/16/2014 01/30/2014  WEIGHT 258 lb 227 lb 250 lb 0.6 oz  HEIGHT     BMI 45.71 kg/m2 40.22 kg/m2 44.3 kg/m2  Some encounter information is confidential and restricted. Go to Review Flowsheets activity to see all data.    Current exercise per week 30 minutes.   Depression Remains severely depressed, not suicidal or homicidal, has been unable to get therapy due to lack of insurance , intends to return as soon as able a this was beneficial       Review of Systems     Objective:   Physical Exam        Assessment & Plan:

## 2014-08-28 NOTE — Patient Instructions (Addendum)
F/u in 3.  Month, call if you need me before  Dose increase of gabapentin to 3 daily, start with 2 at bedtime, then increase to one in the morning and two at bedtime if needed  Fasting lipid, cmp and EGFR , HBA1C, CBC, HIV when able as soon as possible

## 2014-08-28 NOTE — Assessment & Plan Note (Addendum)
Increased and uncontrolled with weight gain and inactivity Increase dose of gabapentin, work on weight loss, good back hygiene and increased activity as able

## 2014-09-01 NOTE — Assessment & Plan Note (Signed)
Remains severely depressed, not suicidal or homicidal, has been unable to get therapy due to lack of insurance , intends to return as soon as able a this was beneficial

## 2014-09-01 NOTE — Assessment & Plan Note (Signed)
Elevated diastolic, no med change at this tinme DASH diet and commitment to daily physical activity for a minimum of 30 minutes discussed and encouraged, as a part of hypertension management. The importance of attaining a healthy weight is also discussed.  BP/Weight 08/28/2014 04/16/2014 01/30/2014 08/20/2013 04/07/2013 04/04/2013 04/03/2013  Systolic BP 128 120 148 138 119 - 135  Diastolic BP 90 84 94 92 80 - 99  Wt. (Lbs) 258 227 250.04 246 - 242 242.13  BMI 45.71 40.22 44.3 42.89 - 42.88 42.9  Some encounter information is confidential and restricted. Go to Review Flowsheets activity to see all data.

## 2014-09-01 NOTE — Assessment & Plan Note (Signed)
Deteriorated. Patient re-educated about  the importance of commitment to a  minimum of 150 minutes of exercise per week.  The importance of healthy food choices with portion control discussed. Encouraged to start a food diary, count calories and to consider  joining a support group. Sample diet sheets offered. Goals set by the patient for the next several months.   Weight /BMI 08/28/2014 04/16/2014 01/30/2014  WEIGHT 258 lb 227 lb 250 lb 0.6 oz  HEIGHT     BMI 45.71 kg/m2 40.22 kg/m2 44.3 kg/m2  Some encounter information is confidential and restricted. Go to Review Flowsheets activity to see all data.    Current exercise per week 30 minutes.

## 2014-09-01 NOTE — Assessment & Plan Note (Signed)
Tami Pearson is reminded of the importance of commitment to daily physical activity for 30 minutes or more, as able and the need to limit carbohydrate intake to 30 to 60 grams per meal to help with blood sugar control.   The need to take medication as prescribed, test blood sugar as directed, and to call between visits if there is a concern that blood sugar is uncontrolled is also discussed.   Tami Pearson is reminded of the importance of daily foot exam, annual eye examination, and good blood sugar, blood pressure and cholesterol control.  Diabetic Labs Latest Ref Rng 04/16/2014 01/30/2014 08/20/2013 04/06/2013 04/05/2013  HbA1c <5.7 % - 6.4(H) 6.4(H) - -  Microalbumin <2.0 mg/dL 1.1 - - - -  Micro/Creat Ratio 0.0 - 30.0 mg/g 7.9 - - - -  Chol 0 - 200 mg/dL - 161(W) - - -  HDL >96 mg/dL - 45 - - -  Calc LDL 0 - 99 mg/dL - 045(W) - - -  Triglycerides <150 mg/dL - 098 - - -  Creatinine 0.50 - 1.10 mg/dL - 1.19 1.47 8.29 5.62   BP/Weight 08/28/2014 04/16/2014 01/30/2014 08/20/2013 04/07/2013 04/04/2013 04/03/2013  Systolic BP 128 120 148 138 119 - 135  Diastolic BP 90 84 94 92 80 - 99  Wt. (Lbs) 258 227 250.04 246 - 242 242.13  BMI 45.71 40.22 44.3 42.89 - 42.88 42.9  Some encounter information is confidential and restricted. Go to Review Flowsheets activity to see all data.   Foot/eye exam completion dates 04/16/2014  Foot Form Completion Done   Will need to update lab and eye exam when pt has insurance

## 2014-09-06 ENCOUNTER — Other Ambulatory Visit: Payer: Self-pay

## 2014-09-06 DIAGNOSIS — M25562 Pain in left knee: Secondary | ICD-10-CM

## 2014-09-06 MED ORDER — HYDROCODONE-ACETAMINOPHEN 10-325 MG PO TABS: ORAL_TABLET | ORAL | Status: DC

## 2014-09-27 ENCOUNTER — Other Ambulatory Visit: Payer: Self-pay

## 2014-09-27 DIAGNOSIS — M25562 Pain in left knee: Secondary | ICD-10-CM

## 2014-09-27 MED ORDER — HYDROCODONE-ACETAMINOPHEN 10-325 MG PO TABS: ORAL_TABLET | ORAL | Status: DC

## 2014-10-14 ENCOUNTER — Telehealth: Payer: Self-pay | Admitting: *Deleted

## 2014-10-14 NOTE — Telephone Encounter (Signed)
i recommend she gets the statement from her ortho Doc

## 2014-10-14 NOTE — Telephone Encounter (Signed)
Patient would like statement saying that she is unable to work due to ortho problems.  Is this ok?

## 2014-10-14 NOTE — Telephone Encounter (Signed)
Pt called stating she needs a statement to take to social services because she is out of work right now and she needs this letter until she gets her disability, pt said she needs it for her medicaid and for food stamps. Pt wants to pick up letter when she comes to get her Rx today, I made her aware I will let the nurse know and she will call her back. Please advise

## 2014-10-14 NOTE — Telephone Encounter (Signed)
Husband made aware

## 2014-10-18 ENCOUNTER — Telehealth: Payer: Self-pay

## 2014-10-18 NOTE — Telephone Encounter (Signed)
Letter composed.  Will notify patient.

## 2014-10-18 NOTE — Telephone Encounter (Signed)
pls type letter stating patient incapable of work currently due to severe arthritis which limits safe mobility, I will sign

## 2014-11-01 ENCOUNTER — Other Ambulatory Visit: Payer: Self-pay

## 2014-11-01 DIAGNOSIS — M25562 Pain in left knee: Secondary | ICD-10-CM

## 2014-11-01 MED ORDER — HYDROCODONE-ACETAMINOPHEN 10-325 MG PO TABS: ORAL_TABLET | ORAL | Status: DC

## 2014-11-04 ENCOUNTER — Ambulatory Visit: Payer: Self-pay | Admitting: Family Medicine

## 2014-11-06 ENCOUNTER — Ambulatory Visit: Payer: Self-pay | Admitting: Family Medicine

## 2014-11-12 ENCOUNTER — Other Ambulatory Visit: Payer: Self-pay

## 2014-12-02 ENCOUNTER — Telehealth: Payer: Self-pay | Admitting: Family Medicine

## 2014-12-02 NOTE — Telephone Encounter (Signed)
Called patient and left message for them to return call at the office   

## 2014-12-02 NOTE — Telephone Encounter (Signed)
Patient is calling stating that she has questions regarding her pain medication HYDROcodone-acetaminophen (NORCO) 10-325 MG tablet, she is asking to increase the dose of the Hydrocodone because the Gabapentin is no longer working, please advise?

## 2014-12-02 NOTE — Telephone Encounter (Signed)
Ov for med adjustment on pain meds

## 2014-12-02 NOTE — Telephone Encounter (Signed)
States you reduced her hydrocodone and gave her gabapentin but the gabapentin isn't helping and she is hurting in her knees and hips and lower back. Rates pain at a 10. Wants to know if you can d/c the gabapentin and increase the hydrocodone to TID. Please advise

## 2014-12-03 NOTE — Telephone Encounter (Signed)
Patient aware. Offered appt and she said she would just wait until her scheduled one because she doesn't have her insurance straight yet

## 2014-12-06 ENCOUNTER — Other Ambulatory Visit: Payer: Self-pay

## 2014-12-06 DIAGNOSIS — M25562 Pain in left knee: Secondary | ICD-10-CM

## 2014-12-06 MED ORDER — HYDROCODONE-ACETAMINOPHEN 10-325 MG PO TABS: ORAL_TABLET | ORAL | Status: DC

## 2015-01-15 ENCOUNTER — Encounter: Payer: Self-pay | Admitting: Family Medicine

## 2015-01-15 ENCOUNTER — Ambulatory Visit (INDEPENDENT_AMBULATORY_CARE_PROVIDER_SITE_OTHER): Payer: Self-pay | Admitting: Family Medicine

## 2015-01-15 VITALS — BP 130/90 | HR 93 | Resp 16 | Ht 63.0 in | Wt 262.0 lb

## 2015-01-15 DIAGNOSIS — M79675 Pain in left toe(s): Secondary | ICD-10-CM

## 2015-01-15 DIAGNOSIS — M549 Dorsalgia, unspecified: Secondary | ICD-10-CM

## 2015-01-15 DIAGNOSIS — F329 Major depressive disorder, single episode, unspecified: Secondary | ICD-10-CM

## 2015-01-15 DIAGNOSIS — I10 Essential (primary) hypertension: Secondary | ICD-10-CM

## 2015-01-15 DIAGNOSIS — Z23 Encounter for immunization: Secondary | ICD-10-CM

## 2015-01-15 DIAGNOSIS — E1169 Type 2 diabetes mellitus with other specified complication: Secondary | ICD-10-CM

## 2015-01-15 DIAGNOSIS — E119 Type 2 diabetes mellitus without complications: Secondary | ICD-10-CM

## 2015-01-15 DIAGNOSIS — M25562 Pain in left knee: Secondary | ICD-10-CM

## 2015-01-15 DIAGNOSIS — E669 Obesity, unspecified: Secondary | ICD-10-CM

## 2015-01-15 DIAGNOSIS — F32A Depression, unspecified: Secondary | ICD-10-CM

## 2015-01-15 MED ORDER — HYDROCODONE-ACETAMINOPHEN 10-325 MG PO TABS: ORAL_TABLET | ORAL | Status: DC

## 2015-01-15 MED ORDER — GABAPENTIN 400 MG PO CAPS: ORAL_CAPSULE | ORAL | Status: DC

## 2015-01-15 MED ORDER — SULFAMETHOXAZOLE-TRIMETHOPRIM 800-160 MG PO TABS: 1.0000 | ORAL_TABLET | Freq: Two times a day (BID) | ORAL | Status: DC

## 2015-01-15 NOTE — Progress Notes (Signed)
Subjective:    Patient ID: Tami Pearson, female    DOB: 06-16-1965, 48 y.o.   MRN: [id number]  HPI   Tami Pearson     MRN: [id number]      DOB: [birth year]   HPI Tami Pearson is here for follow up and re-evaluation of chronic medical conditions, medication management and review of any available recent lab and radiology data.  Preventive health is updated, specifically  Cancer screening and Immunization.   Questions or concerns regarding consultations or procedures which the PT has had in the interim are  addressed. The PT denies any adverse reactions to current medications since the last visit.  C/o increased and uncontrolled low back pain radiating to both thighs rated at a 10, wants gabapentin and norco doses changed , no noted lower ext weakness and numbness Increased left knee pain and instability, fell 1 week ago, has had multiple falls , on avg 2 falls per month inpast 3 months, awaiting bilateral knee replacement has ahd hips, needs insurance  ROS Denies recent fever or chills. Denies sinus pressure, nasal congestion, ear pain or sore throat. Denies chest congestion, productive cough or wheezing. Denies chest pains, palpitations and leg swelling Denies abdominal pain, nausea, vomiting,diarrhea or constipation.   Denies dysuria, frequency, hesitancy or incontinence.  C/o  depression, anxiety or insomnia.Not suicidal or homicidal Denies skin break down or rash.   PE  BP 130/90 mmHg  Pulse 93  Resp 16  Ht  (1.6 m)  Wt 262 lb (118.842 kg)  BMI 46.42 kg/m2  SpO2 97%  Patient alert and oriented and in no cardiopulmonary distress.  HEENT: No facial asymmetry, EOMI,   oropharynx pink and moist.  Neck supple no JVD, no mass.  Chest: Clear to auscultation bilaterally.  CVS: S1, S2 no murmurs, no S3.Regular rate.  ABD: Soft non tender.   Ext: No edema  MS: Decreased  ROM spine, , hips and knees.  Skin: Infected ingrown toenail of left great  toe  Psych: Good eye contact, normal affect. Memory intact not anxious mildly depressed appearing.  CNS: CN 2-12 intact, power,  normal throughout.no focal deficits noted.   Assessment & Plan   Pain of toe of left foot Left ingrown great toe which is painful x 2 weeks  Back pain with radiation Uncontrolled on current regime. Increased dose ogf gabapentin and hydrocodone  Essential hypertension Uncontrolled, no med change DASH diet and commitment to daily physical activity for a minimum of 30 minutes discussed and encouraged, as a part of hypertension management. The importance of attaining a healthy weight is also discussed.  BP/Weight 01/15/2015 08/28/2014 04/16/2014 01/30/2014 08/20/2013 04/07/2013 04/04/2013  Systolic BP 130 128 120 148 138 119 -  Diastolic BP 90 90 84 94 92 80 -  Wt. (Lbs) 262 258 227 250.04 246 - 242  BMI 46.42 45.71 40.22 44.3 42.89 - 42.88  Some encounter information is confidential and restricted. Go to Review Flowsheets activity to see all data.        Diabetes mellitus type 2 in obese Tami Pearson is reminded of the importance of commitment to daily physical activity for 30 minutes or more, as able and the need to limit carbohydrate intake to 30 to 60 grams per meal to help with blood sugar control.   The need to take medication as prescribed, test blood sugar as directed, and to call between visits if there is a concern that blood sugar is uncontrolled is also discussed.  Tami Pearson is reminded of the importance of daily foot exam, annual eye examination, and good blood sugar, blood pressure and cholesterol control. Updated lab needed .   Diabetic Labs Latest Ref Rng 04/16/2014 01/30/2014 08/20/2013 04/06/2013 04/05/2013  HbA1c <5.7 % - 6.4(H) 6.4(H) - -  Microalbumin <2.0 mg/dL 1.1 - - - -  Micro/Creat Ratio 0.0 - 30.0 mg/g 7.9 - - - -  Chol 0 - 200 mg/dL - 161(W202(H) - - -  HDL >96>39 mg/dL - 45 - - -  Calc LDL 0 - 99 mg/dL - 045(W129(H) - - -  Triglycerides  <150 mg/dL - 098142 - - -  Creatinine 0.50 - 1.10 mg/dL - 1.190.87 1.470.97 8.290.96 5.620.99   BP/Weight 01/15/2015 08/28/2014 04/16/2014 01/30/2014 08/20/2013 04/07/2013 04/04/2013  Systolic BP 130 128 120 148 138 119 -  Diastolic BP 90 90 84 94 92 80 -  Wt. (Lbs) 262 258 227 250.04 246 - 242  BMI 46.42 45.71 40.22 44.3 42.89 - 42.88  Some encounter information is confidential and restricted. Go to Review Flowsheets activity to see all data.   Foot/eye exam completion dates 04/16/2014  Foot Form Completion Done         Morbid obesity Deteriorated. Patient re-educated about  the importance of commitment to a  minimum of 150 minutes of exercise per week.  The importance of healthy food choices with portion control discussed. Encouraged to start a food diary, count calories and to consider  joining a support group. Sample diet sheets offered. Goals set by the patient for the next several months.   Weight /BMI 01/15/2015 08/28/2014 04/16/2014  WEIGHT 262 lb 258 lb 227 lb  HEIGHT 5\' 3"  5\' 3"  5\' 3"   BMI 46.42 kg/m2 45.71 kg/m2 40.22 kg/m2  Some encounter information is confidential and restricted. Go to Review Flowsheets activity to see all data.    Current exercise per week  30minutes.Marked limitation in mobility due to severe osteoarthritis   Depression Not suicidal or homicidal, bu increased anxiety and stress on a personal level. Treated by psychiatry  Left knee pain Left kneee pain and instability increased with recurrent falls, on avg 3 per month       Review of Systems     Objective:   Physical Exam        Assessment & Plan:

## 2015-01-15 NOTE — Assessment & Plan Note (Signed)
Left ingrown great toe which is painful x 2 weeks

## 2015-01-15 NOTE — Patient Instructions (Addendum)
F/u in 4 month, call if you need me sooner 1 week antibiotic prescribed for toe pain from ingrown toe with infection  Increase in gabapentin and norco dose  Please work on changing diet so that blood pressure normalizes, lower number is too high  Flu vaccine today  CALL as soon as you obtain insurance, you NEED blood work, mammogram, colonoscopy  Thanks for choosing [facility], we consider it a privelige to serve you.  All the best for 2017!

## 2015-02-04 ENCOUNTER — Other Ambulatory Visit: Payer: Self-pay | Admitting: Family Medicine

## 2015-02-04 MED ORDER — ATORVASTATIN CALCIUM 20 MG PO TABS: 20.0000 mg | ORAL_TABLET | Freq: Every day | ORAL | Status: DC

## 2015-02-07 ENCOUNTER — Other Ambulatory Visit: Payer: Self-pay

## 2015-02-07 DIAGNOSIS — M549 Dorsalgia, unspecified: Secondary | ICD-10-CM

## 2015-02-07 MED ORDER — HYDROCODONE-ACETAMINOPHEN 10-325 MG PO TABS: ORAL_TABLET | ORAL | Status: DC

## 2015-02-16 ENCOUNTER — Encounter: Payer: Self-pay | Admitting: Family Medicine

## 2015-02-16 NOTE — Assessment & Plan Note (Signed)
Not suicidal or homicidal, bu increased anxiety and stress on a personal level. Treated by psychiatry

## 2015-02-16 NOTE — Assessment & Plan Note (Signed)
Deteriorated. Patient re-educated about  the importance of commitment to a  minimum of 150 minutes of exercise per week.  The importance of healthy food choices with portion control discussed. Encouraged to start a food diary, count calories and to consider  joining a support group. Sample diet sheets offered. Goals set by the patient for the next several months.   Weight /BMI 01/15/2015 08/28/2014 04/16/2014  WEIGHT 262 lb 258 lb 227 lb  HEIGHT 5\' 3"  5\' 3"  5\' 3"   BMI 46.42 kg/m2 45.71 kg/m2 40.22 kg/m2  Some encounter information is confidential and restricted. Go to Review Flowsheets activity to see all data.    Current exercise per week  30minutes.Marked limitation in mobility due to severe osteoarthritis

## 2015-02-16 NOTE — Assessment & Plan Note (Signed)
Left kneee pain and instability increased with recurrent falls, on avg 3 per month

## 2015-02-16 NOTE — Assessment & Plan Note (Signed)
Uncontrolled, no med change DASH diet and commitment to daily physical activity for a minimum of 30 minutes discussed and encouraged, as a part of hypertension management. The importance of attaining a healthy weight is also discussed.  BP/Weight 01/15/2015 08/28/2014 04/16/2014 01/30/2014 08/20/2013 04/07/2013 04/04/2013  Systolic BP 130 128 120 148 138 119 -  Diastolic BP 90 90 84 94 92 80 -  Wt. (Lbs) 262 258 227 250.04 246 - 242  BMI 46.42 45.71 40.22 44.3 42.89 - 42.88  Some encounter information is confidential and restricted. Go to Review Flowsheets activity to see all data.

## 2015-02-16 NOTE — Assessment & Plan Note (Signed)
Tami Pearson is reminded of the importance of commitment to daily physical activity for 30 minutes or more, as able and the need to limit carbohydrate intake to 30 to 60 grams per meal to help with blood sugar control.   The need to take medication as prescribed, test blood sugar as directed, and to call between visits if there is a concern that blood sugar is uncontrolled is also discussed.   Tami Pearson is reminded of the importance of daily foot exam, annual eye examination, and good blood sugar, blood pressure and cholesterol control. Updated lab needed .   Diabetic Labs Latest Ref Rng 04/16/2014 01/30/2014 08/20/2013 04/06/2013 04/05/2013  HbA1c <5.7 % - 6.4(H) 6.4(H) - -  Microalbumin <2.0 mg/dL 1.1 - - - -  Micro/Creat Ratio 0.0 - 30.0 mg/g 7.9 - - - -  Chol 0 - 200 mg/dL - 161(W202(H) - - -  HDL >96>39 mg/dL - 45 - - -  Calc LDL 0 - 99 mg/dL - 045(W129(H) - - -  Triglycerides <150 mg/dL - 098142 - - -  Creatinine 0.50 - 1.10 mg/dL - 1.190.87 1.470.97 8.290.96 5.620.99   BP/Weight 01/15/2015 08/28/2014 04/16/2014 01/30/2014 08/20/2013 04/07/2013 04/04/2013  Systolic BP 130 128 120 148 138 119 -  Diastolic BP 90 90 84 94 92 80 -  Wt. (Lbs) 262 258 227 250.04 246 - 242  BMI 46.42 45.71 40.22 44.3 42.89 - 42.88  Some encounter information is confidential and restricted. Go to Review Flowsheets activity to see all data.   Foot/eye exam completion dates 04/16/2014  Foot Form Completion Done

## 2015-02-16 NOTE — Assessment & Plan Note (Signed)
Uncontrolled on current regime. Increased dose ogf gabapentin and hydrocodone

## 2015-03-06 ENCOUNTER — Other Ambulatory Visit: Payer: Self-pay

## 2015-03-06 DIAGNOSIS — M549 Dorsalgia, unspecified: Secondary | ICD-10-CM

## 2015-03-06 MED ORDER — HYDROCODONE-ACETAMINOPHEN 10-325 MG PO TABS: ORAL_TABLET | ORAL | Status: DC

## 2015-03-25 ENCOUNTER — Telehealth: Payer: Self-pay

## 2015-03-25 MED ORDER — ALPRAZOLAM 0.5 MG PO TABS: 0.5000 mg | ORAL_TABLET | Freq: Every evening | ORAL | Status: DC | PRN

## 2015-03-25 NOTE — Telephone Encounter (Signed)
Wants something called in for her nerves because she has been very anxious and stressed out and she is starting to some of her hair on top. Please advise if something can be sent in for her.

## 2015-03-25 NOTE — Telephone Encounter (Signed)
Will send 30 day ONLY supply of bedtime xanax, also pls refer her back to psychology AND psychiatry (dr Tenny Craw) 2 separate referrals for management of depression and anxiety,I will sign

## 2015-03-25 NOTE — Telephone Encounter (Signed)
Pt aware and already has an appt with ross and rodenbaugh in the next  2 weeks

## 2015-03-27 ENCOUNTER — Ambulatory Visit (INDEPENDENT_AMBULATORY_CARE_PROVIDER_SITE_OTHER): Payer: Medicaid Other | Admitting: Psychology

## 2015-03-27 DIAGNOSIS — F331 Major depressive disorder, recurrent, moderate: Secondary | ICD-10-CM | POA: Diagnosis not present

## 2015-03-28 ENCOUNTER — Encounter (HOSPITAL_COMMUNITY): Payer: Self-pay | Admitting: Psychiatry

## 2015-03-28 ENCOUNTER — Ambulatory Visit (INDEPENDENT_AMBULATORY_CARE_PROVIDER_SITE_OTHER): Payer: Medicaid Other | Admitting: Psychiatry

## 2015-03-28 VITALS — BP 136/95 | HR 88 | Ht 63.0 in | Wt 258.4 lb

## 2015-03-28 DIAGNOSIS — F331 Major depressive disorder, recurrent, moderate: Secondary | ICD-10-CM

## 2015-03-28 MED ORDER — DULOXETINE HCL 60 MG PO CPEP: 60.0000 mg | ORAL_CAPSULE | Freq: Two times a day (BID) | ORAL | Status: DC

## 2015-03-28 MED ORDER — ALPRAZOLAM 0.5 MG PO TABS: 0.5000 mg | ORAL_TABLET | Freq: Every evening | ORAL | Status: DC | PRN
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
## 2015-03-31 ENCOUNTER — Encounter: Payer: Self-pay | Admitting: Family Medicine

## 2015-04-02 ENCOUNTER — Telehealth: Payer: Self-pay | Admitting: Family Medicine

## 2015-04-02 ENCOUNTER — Other Ambulatory Visit: Payer: Self-pay

## 2015-04-02 DIAGNOSIS — M549 Dorsalgia, unspecified: Secondary | ICD-10-CM

## 2015-04-02 MED ORDER — HYDROXYZINE HCL 50 MG PO TABS: 50.0000 mg | ORAL_TABLET | Freq: Every day | ORAL | Status: DC

## 2015-04-02 MED ORDER — METFORMIN HCL 500 MG PO TABS: 500.0000 mg | ORAL_TABLET | Freq: Two times a day (BID) | ORAL | Status: DC

## 2015-04-02 MED ORDER — GABAPENTIN 400 MG PO CAPS: ORAL_CAPSULE | ORAL | Status: DC

## 2015-04-02 NOTE — Telephone Encounter (Signed)
Medications refilled

## 2015-04-02 NOTE — Telephone Encounter (Signed)
Patient is needing a refill on metFORMIN (GLUCOPHAGE) 500 MG tablet ,gabapentin (NEURONTIN) 400 MG capsule,hydrOXYzine (ATARAX/VISTARIL) 50 MG tablet

## 2015-04-03 ENCOUNTER — Ambulatory Visit: Payer: Self-pay | Admitting: Orthopaedic Surgery

## 2015-04-04 ENCOUNTER — Other Ambulatory Visit: Payer: Self-pay

## 2015-04-04 DIAGNOSIS — M549 Dorsalgia, unspecified: Secondary | ICD-10-CM

## 2015-04-04 MED ORDER — HYDROCODONE-ACETAMINOPHEN 10-325 MG PO TABS: ORAL_TABLET | ORAL | Status: DC

## 2015-04-07 ENCOUNTER — Telehealth: Payer: Self-pay | Admitting: Family Medicine

## 2015-04-07 NOTE — Telephone Encounter (Signed)
Called in for a 30 day supply and it went through. The quantity was too high

## 2015-04-07 NOTE — Telephone Encounter (Signed)
Patient is stating that Tami Pearson is telling her that her insurance will not cover the medication gabapentin (NEURONTIN) 400 MG capsule please advise?

## 2015-04-08 ENCOUNTER — Ambulatory Visit (INDEPENDENT_AMBULATORY_CARE_PROVIDER_SITE_OTHER): Payer: Medicaid Other | Admitting: Orthopaedic Surgery

## 2015-04-08 ENCOUNTER — Ambulatory Visit (INDEPENDENT_AMBULATORY_CARE_PROVIDER_SITE_OTHER): Payer: Medicaid Other

## 2015-04-08 VITALS — BP 149/100 | HR 75 | Temp 97.3°F | Ht 63.5 in | Wt 261.4 lb

## 2015-04-08 DIAGNOSIS — M25562 Pain in left knee: Secondary | ICD-10-CM

## 2015-04-08 NOTE — Patient Instructions (Signed)
Make appointment to see Dr. Romeo AppleHarrison for total knee left.
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
## 2015-04-10 ENCOUNTER — Telehealth: Payer: Self-pay | Admitting: Family Medicine

## 2015-04-10 NOTE — Telephone Encounter (Signed)
Patient is calling asking if she would qualify for in home help at least a couple days a week, please advise?

## 2015-04-11 NOTE — Telephone Encounter (Signed)
PCS form filled out. ?

## 2015-04-16 ENCOUNTER — Ambulatory Visit (INDEPENDENT_AMBULATORY_CARE_PROVIDER_SITE_OTHER): Payer: Medicaid Other | Admitting: Family Medicine

## 2015-04-16 ENCOUNTER — Encounter: Payer: Self-pay | Admitting: Family Medicine

## 2015-04-16 VITALS — BP 140/100 | HR 87 | Resp 16 | Ht 64.0 in | Wt 260.4 lb

## 2015-04-16 DIAGNOSIS — E1169 Type 2 diabetes mellitus with other specified complication: Secondary | ICD-10-CM

## 2015-04-16 DIAGNOSIS — D649 Anemia, unspecified: Secondary | ICD-10-CM

## 2015-04-16 DIAGNOSIS — R519 Headache, unspecified: Secondary | ICD-10-CM | POA: Insufficient documentation

## 2015-04-16 DIAGNOSIS — G44011 Episodic cluster headache, intractable: Secondary | ICD-10-CM | POA: Diagnosis not present

## 2015-04-16 DIAGNOSIS — I10 Essential (primary) hypertension: Secondary | ICD-10-CM | POA: Diagnosis not present

## 2015-04-16 DIAGNOSIS — Z1159 Encounter for screening for other viral diseases: Secondary | ICD-10-CM

## 2015-04-16 DIAGNOSIS — R51 Headache: Secondary | ICD-10-CM

## 2015-04-16 DIAGNOSIS — E669 Obesity, unspecified: Secondary | ICD-10-CM

## 2015-04-16 DIAGNOSIS — E119 Type 2 diabetes mellitus without complications: Secondary | ICD-10-CM

## 2015-04-16 DIAGNOSIS — R110 Nausea: Secondary | ICD-10-CM

## 2015-04-16 DIAGNOSIS — E785 Hyperlipidemia, unspecified: Secondary | ICD-10-CM

## 2015-04-16 LAB — CBC WITH DIFFERENTIAL/PLATELET
BASOS ABS: 0.1 10*3/uL (ref 0.0–0.1)
BASOS PCT: 1 % (ref 0–1)
EOS ABS: 0.1 10*3/uL (ref 0.0–0.7)
Eosinophils Relative: 1 % (ref 0–5)
HCT: 39.4 % (ref 36.0–46.0)
HEMOGLOBIN: 13.3 g/dL (ref 12.0–15.0)
Lymphocytes Relative: 38 % (ref 12–46)
Lymphs Abs: 3.5 10*3/uL (ref 0.7–4.0)
MCH: 29 pg (ref 26.0–34.0)
MCHC: 33.8 g/dL (ref 30.0–36.0)
MCV: 85.8 fL (ref 78.0–100.0)
MPV: 9.5 fL (ref 8.6–12.4)
Monocytes Absolute: 0.6 10*3/uL (ref 0.1–1.0)
Monocytes Relative: 6 % (ref 3–12)
NEUTROS ABS: 5 10*3/uL (ref 1.7–7.7)
NEUTROS PCT: 54 % (ref 43–77)
PLATELETS: 356 10*3/uL (ref 150–400)
RBC: 4.59 MIL/uL (ref 3.87–5.11)
RDW: 14.5 % (ref 11.5–15.5)
WBC: 9.2 10*3/uL (ref 4.0–10.5)

## 2015-04-16 MED ORDER — ONDANSETRON HCL 4 MG PO TABS: 4.0000 mg | ORAL_TABLET | Freq: Three times a day (TID) | ORAL | Status: DC | PRN

## 2015-04-16 MED ORDER — BUTALBITAL-APAP-CAFFEINE 50-500-40 MG PO TABS: ORAL_TABLET | ORAL | Status: DC

## 2015-04-16 MED ORDER — LISINOPRIL-HYDROCHLOROTHIAZIDE 20-25 MG PO TABS: 1.0000 | ORAL_TABLET | Freq: Every day | ORAL | Status: DC

## 2015-04-16 MED ORDER — PREDNISONE 5 MG (21) PO TBPK
5.0000 mg | ORAL_TABLET | ORAL | Status: DC
Start: 2015-04-16 — End: 2015-04-28

## 2015-04-16 NOTE — Patient Instructions (Signed)
F/u as before  Increase in dose of blood pressure med, take TWO of your current tablets together every day till done   You are treated for migraine headache and nausea , injections in office and medications sent  Labs today please

## 2015-04-16 NOTE — Progress Notes (Signed)
Subjective:    Patient ID: Tami Pearson, female    DOB: 02/08/1965, 50 y.o.   MRN: [id number]  HPI 2 week h/o daily throbbing headache, frontal and bi temporal, associated with nausea. No residual or accompanying weakness or numbness. Increased personal stress , which is an obvious trigger, the headaches are new for her Has recently resumed psychiatry car for which she is grateful, not suicidal or homicidal Has upcoming left knee replacement due to instability and pain   Review of Systems See HPI Denies recent fever or chills. Denies sinus pressure, nasal congestion, ear pain or sore throat. Denies chest congestion, productive cough or wheezing. Denies chest pains, palpitations and leg swelling Denies abdominal pain,  vomiting,diarrhea or constipation.   Denies dysuria, frequency, hesitancy or incontinence. Denies skin break down or rash.        Objective:   Physical Exam  BP 140/100 mmHg  Pulse 87  Resp 16  Ht 5\' 4"  (1.626 m)  Wt 260 lb 6.4 oz (118.117 kg)  BMI 44.68 kg/m2  SpO2 96%  Patient alert and oriented and in no cardiopulmonary distress.  HEENT: No facial asymmetry, EOMI,   oropharynx pink and moist.  Neck supple no JVD, no mass. EOMI, PERTL Chest: Clear to auscultation bilaterally.  CVS: S1, S2 no murmurs, no S3.Regular rate.  ABD: Soft non tender.   Ext: No edema  MS: decreased  ROM spine, shoulders, hips and knees.  Skin: Intact, no ulcerations or rash noted.  Psych: Good eye contact, flat  affect. Memory intact not anxious tearful and  depressed appearing.  CNS: CN 2-12 intact, power, tone and reflexes   normal throughout.no focal deficits noted.       Assessment & Plan:  Headache 2 week history , frontal pounding with nausea,  depo medrol in office followed by pred dose pack, also zofran in office , fioricet for pain, call if no response or worsening.  Clinical dx is migraine aggravated by increased  stress and associated with  nausea  Essential hypertension Uncontrolled dose increase in medication Nurse Bp recheck in 4 to 6 weeks DASH diet and commitment to daily physical activity for a minimum of 30 minutes discussed and encouraged, as a part of hypertension management. The importance of attaining a healthy weight is also discussed.  BP/Weight 04/16/2015 04/08/2015 01/15/2015 08/28/2014 04/16/2014 01/30/2014 08/20/2013  Systolic BP 140 149 130 128 120 148 138  Diastolic BP 100 100 90 90 84 94 92  Wt. (Lbs) 260.4 261.4 262 258 227 250.04 246  BMI 44.68 45.57 46.42 45.71 40.22 44.3 42.89  Some encounter information is confidential and restricted. Go to Review Flowsheets activity to see all data.        Diabetes mellitus type 2 in obese Tami Pearson is reminded of the importance of commitment to daily physical activity for 30 minutes or more, as able and the need to limit carbohydrate intake to 30 to 60 grams per meal to help with blood sugar control.   The need to take medication as prescribed, test blood sugar as directed, and to call between visits if there is a concern that blood sugar is uncontrolled is also discussed.   Tami Pearson is reminded of the importance of daily foot exam, annual eye examination, and good blood sugar, blood pressure and cholesterol control.  Diabetic Labs Latest Ref Rng 04/16/2015 04/16/2014 01/30/2014 08/20/2013 04/06/2013  HbA1c <5.7 % 6.6(H) - 6.4(H) 6.4(H) -  Microalbumin <2.0 mg/dL - 1.1 - - -  Micro/Creat Ratio 0.0 - 30.0 mg/g - 7.9 - - -  Chol 125 - 200 mg/dL 578 - 469(G) - -  HDL >=29 mg/dL 53 - 45 - -  Calc LDL <528 mg/dL 413 - 244(W) - -  Triglycerides <150 mg/dL 51 - 102 - -  Creatinine 0.50 - 1.05 mg/dL 7.25 - 3.66 4.40 3.47   BP/Weight 04/16/2015 04/08/2015 01/15/2015 08/28/2014 04/16/2014 01/30/2014 08/20/2013  Systolic BP 140 149 130 128 120 148 138  Diastolic BP 100 100 90 90 84 94 92  Wt. (Lbs) 260.4 261.4 262 258 227 250.04 246  BMI 44.68 45.57 46.42 45.71 40.22 44.3  42.89  Some encounter information is confidential and restricted. Go to Review Flowsheets activity to see all data.   Foot/eye exam completion dates 04/16/2014  Foot Form Completion Done   Controlled, no change in medication Tami Pearson is reminded of the importance of commitment to daily physical activity for 30 minutes or more, as able and the need to limit carbohydrate intake to 30 to 60 grams per meal to help with blood sugar control.   The need to take medication as prescribed, test blood sugar as directed, and to call between visits if there is a concern that blood sugar is uncontrolled is also discussed.   Tami Pearson is reminded of the importance of daily foot exam, annual eye examination, and good blood sugar, blood pressure and cholesterol control.  Diabetic Labs Latest Ref Rng 04/16/2015 04/16/2014 01/30/2014 08/20/2013 04/06/2013  HbA1c <5.7 % 6.6(H) - 6.4(H) 6.4(H) -  Microalbumin <2.0 mg/dL - 1.1 - - -  Micro/Creat Ratio 0.0 - 30.0 mg/g - 7.9 - - -  Chol 125 - 200 mg/dL 425 - 956(L) - -  HDL >=87 mg/dL 53 - 45 - -  Calc LDL <564 mg/dL 332 - 951(O) - -  Triglycerides <150 mg/dL 51 - 841 - -  Creatinine 0.50 - 1.05 mg/dL 6.60 - 6.30 1.60 1.09   BP/Weight 04/16/2015 04/08/2015 01/15/2015 08/28/2014 04/16/2014 01/30/2014 08/20/2013  Systolic BP 140 149 130 128 120 148 138  Diastolic BP 100 100 90 90 84 94 92  Wt. (Lbs) 260.4 261.4 262 258 227 250.04 246  BMI 44.68 45.57 46.42 45.71 40.22 44.3 42.89  Some encounter information is confidential and restricted. Go to Review Flowsheets activity to see all data.   Foot/eye exam completion dates 04/16/2014  Foot Form Completion Done             Hyperlipidemia LDL goal <100 Not at goal, no med change Hyperlipidemia:Low fat diet discussed and encouraged.   Lipid Panel  Lab Results  Component Value Date   CHOL 170 04/16/2015   HDL 53 04/16/2015   LDLCALC 107 04/16/2015   TRIG 51 04/16/2015   CHOLHDL 3.2 04/16/2015         Morbid obesity Unchnaged. Patient re-educated about  the importance of commitment to a  minimum of 150 minutes of exercise per week.  The importance of healthy food choices with portion control discussed. Encouraged to start a food diary, count calories and to consider  joining a support group. Sample diet sheets offered. Goals set by the patient for the next several months.   Weight /BMI 04/16/2015 04/08/2015 01/15/2015  WEIGHT 260 lb 6.4 oz 261 lb 6.4 oz 262 lb  HEIGHT  5' 3.5"   BMI 44.68 kg/m2 45.57 kg/m2 46.42 kg/m2  Some encounter information is confidential and restricted. Go to Review Flowsheets activity to see all data.  Nausea without vomiting Zofran administered in office

## 2015-04-16 NOTE — Assessment & Plan Note (Addendum)
2 week history , frontal pounding with nausea,  depo medrol in office followed by pred dose pack, also zofran in office , fioricet for pain, call if no response or worsening.  Clinical dx is migraine aggravated by increased  stress and associated with nausea

## 2015-04-17 ENCOUNTER — Other Ambulatory Visit: Payer: Self-pay

## 2015-04-17 DIAGNOSIS — G44011 Episodic cluster headache, intractable: Secondary | ICD-10-CM

## 2015-04-17 LAB — COMPLETE METABOLIC PANEL WITH GFR
ALT: 14 U/L (ref 6–29)
AST: 18 U/L (ref 10–35)
Albumin: 3.8 g/dL (ref 3.6–5.1)
Alkaline Phosphatase: 69 U/L (ref 33–130)
BUN: 21 mg/dL (ref 7–25)
CALCIUM: 9.4 mg/dL (ref 8.6–10.4)
CHLORIDE: 100 mmol/L (ref 98–110)
CO2: 26 mmol/L (ref 20–31)
Creat: 0.9 mg/dL (ref 0.50–1.05)
GFR, EST AFRICAN AMERICAN: 86 mL/min (ref >=60)
GFR, Est Non African American: 75 mL/min (ref >=60)
Glucose, Bld: 81 mg/dL (ref 65–99)
POTASSIUM: 4.1 mmol/L (ref 3.5–5.3)
Sodium: 139 mmol/L (ref 135–146)
Total Bilirubin: 0.3 mg/dL (ref 0.2–1.2)
Total Protein: 9 g/dL — ABNORMAL HIGH (ref 6.1–8.1)

## 2015-04-17 LAB — LIPID PANEL
Cholesterol: 170 mg/dL (ref 125–200)
HDL: 53 mg/dL
LDL Cholesterol: 107 mg/dL
Total CHOL/HDL Ratio: 3.2 ratio
Triglycerides: 51 mg/dL
VLDL: 10 mg/dL

## 2015-04-17 LAB — HIV ANTIBODY (ROUTINE TESTING W REFLEX): HIV: NONREACTIVE

## 2015-04-17 LAB — HEMOGLOBIN A1C
Hgb A1c MFr Bld: 6.6 % — ABNORMAL HIGH
Mean Plasma Glucose: 143 mg/dL — ABNORMAL HIGH

## 2015-04-17 MED ORDER — BUTALBITAL-APAP-CAFFEINE 50-325-40 MG PO TABS: 1.0000 | ORAL_TABLET | Freq: Two times a day (BID) | ORAL | Status: DC | PRN

## 2015-04-17 MED ORDER — ONDANSETRON HCL 4 MG/2ML IJ SOLN
4.0000 mg | Freq: Once | INTRAMUSCULAR | Status: AC
  Administered 2015-04-17: 4 mg via INTRAMUSCULAR

## 2015-04-17 MED ORDER — METHYLPREDNISOLONE ACETATE 80 MG/ML IJ SUSP
80.0000 mg | Freq: Once | INTRAMUSCULAR | Status: AC
  Administered 2015-04-17: 80 mg via INTRAMUSCULAR

## 2015-04-20 DIAGNOSIS — R110 Nausea: Secondary | ICD-10-CM | POA: Insufficient documentation

## 2015-04-20 NOTE — Assessment & Plan Note (Signed)
Not at goal, no med change Hyperlipidemia:Low fat diet discussed and encouraged.   Lipid Panel  Lab Results  Component Value Date   CHOL 170 04/16/2015   HDL 53 04/16/2015   LDLCALC 107 04/16/2015   TRIG 51 04/16/2015   CHOLHDL 3.2 04/16/2015

## 2015-04-20 NOTE — Assessment & Plan Note (Signed)
Unchnaged. Patient re-educated about  the importance of commitment to a  minimum of 150 minutes of exercise per week.  The importance of healthy food choices with portion control discussed. Encouraged to start a food diary, count calories and to consider  joining a support group. Sample diet sheets offered. Goals set by the patient for the next several months.   Weight /BMI 04/16/2015 04/08/2015 01/15/2015  WEIGHT 260 lb 6.4 oz 261 lb 6.4 oz 262 lb  HEIGHT 5\' 4"  5' 3.5" 5\' 3"   BMI 44.68 kg/m2 45.57 kg/m2 46.42 kg/m2  Some encounter information is confidential and restricted. Go to Review Flowsheets activity to see all data.

## 2015-04-20 NOTE — Assessment & Plan Note (Signed)
Tami Pearson is reminded of the importance of commitment to daily physical activity for 30 minutes or more, as able and the need to limit carbohydrate intake to 30 to 60 grams per meal to help with blood sugar control.   The need to take medication as prescribed, test blood sugar as directed, and to call between visits if there is a concern that blood sugar is uncontrolled is also discussed.   Tami Pearson is reminded of the importance of daily foot exam, annual eye examination, and good blood sugar, blood pressure and cholesterol control.  Diabetic Labs Latest Ref Rng 04/16/2015 04/16/2014 01/30/2014 08/20/2013 04/06/2013  HbA1c <5.7 % 6.6(H) - 6.4(H) 6.4(H) -  Microalbumin <2.0 mg/dL - 1.1 - - -  Micro/Creat Ratio 0.0 - 30.0 mg/g - 7.9 - - -  Chol 125 - 200 mg/dL 161170 - 096(E202(H) - -  HDL >=45>=46 mg/dL 53 - 45 - -  Calc LDL <409<130 mg/dL 811107 - 914(N129(H) - -  Triglycerides <150 mg/dL 51 - 829142 - -  Creatinine 0.50 - 1.05 mg/dL 5.620.90 - 1.300.87 8.650.97 7.840.96   BP/Weight 04/16/2015 04/08/2015 01/15/2015 08/28/2014 04/16/2014 01/30/2014 08/20/2013  Systolic BP 140 149 130 128 120 148 138  Diastolic BP 100 100 90 90 84 94 92  Wt. (Lbs) 260.4 261.4 262 258 227 250.04 246  BMI 44.68 45.57 46.42 45.71 40.22 44.3 42.89  Some encounter information is confidential and restricted. Go to Review Flowsheets activity to see all data.   Foot/eye exam completion dates 04/16/2014  Foot Form Completion Done   Controlled, no change in medication Tami Pearson is reminded of the importance of commitment to daily physical activity for 30 minutes or more, as able and the need to limit carbohydrate intake to 30 to 60 grams per meal to help with blood sugar control.   The need to take medication as prescribed, test blood sugar as directed, and to call between visits if there is a concern that blood sugar is uncontrolled is also discussed.   Tami Pearson is reminded of the importance of daily foot exam, annual eye examination, and good blood sugar,  blood pressure and cholesterol control.  Diabetic Labs Latest Ref Rng 04/16/2015 04/16/2014 01/30/2014 08/20/2013 04/06/2013  HbA1c <5.7 % 6.6(H) - 6.4(H) 6.4(H) -  Microalbumin <2.0 mg/dL - 1.1 - - -  Micro/Creat Ratio 0.0 - 30.0 mg/g - 7.9 - - -  Chol 125 - 200 mg/dL 696170 - 295(M202(H) - -  HDL >=84>=46 mg/dL 53 - 45 - -  Calc LDL <132<130 mg/dL 440107 - 102(V129(H) - -  Triglycerides <150 mg/dL 51 - 253142 - -  Creatinine 0.50 - 1.05 mg/dL 6.640.90 - 4.030.87 4.740.97 2.590.96   BP/Weight 04/16/2015 04/08/2015 01/15/2015 08/28/2014 04/16/2014 01/30/2014 08/20/2013  Systolic BP 140 149 130 128 120 148 138  Diastolic BP 100 100 90 90 84 94 92  Wt. (Lbs) 260.4 261.4 262 258 227 250.04 246  BMI 44.68 45.57 46.42 45.71 40.22 44.3 42.89  Some encounter information is confidential and restricted. Go to Review Flowsheets activity to see all data.   Foot/eye exam completion dates 04/16/2014  Foot Form Completion Done

## 2015-04-20 NOTE — Assessment & Plan Note (Signed)
Zofran administered in office 

## 2015-04-20 NOTE — Assessment & Plan Note (Signed)
Uncontrolled dose increase in medication Nurse Bp recheck in 4 to 6 weeks DASH diet and commitment to daily physical activity for a minimum of 30 minutes discussed and encouraged, as a part of hypertension management. The importance of attaining a healthy weight is also discussed.  BP/Weight 04/16/2015 04/08/2015 01/15/2015 08/28/2014 04/16/2014 01/30/2014 08/20/2013  Systolic BP 140 149 130 128 120 148 138  Diastolic BP 100 100 90 90 84 94 92  Wt. (Lbs) 260.4 261.4 262 258 227 250.04 246  BMI 44.68 45.57 46.42 45.71 40.22 44.3 42.89  Some encounter information is confidential and restricted. Go to Review Flowsheets activity to see all data.

## 2015-04-22 ENCOUNTER — Encounter (HOSPITAL_COMMUNITY): Payer: Self-pay | Admitting: Psychology

## 2015-04-22 IMAGING — CR DG HIP (WITH OR WITHOUT PELVIS) 2-3V*L*
3 series · 3 of 3 positions shown · non-contrast
Comparison: None

CLINICAL DATA: Preoperative evaluation for total hip replacement,
history hypertension, diabetes

EXAM:
LEFT HIP - COMPLETE 2+ VIEW

[t pelvis a.p.]
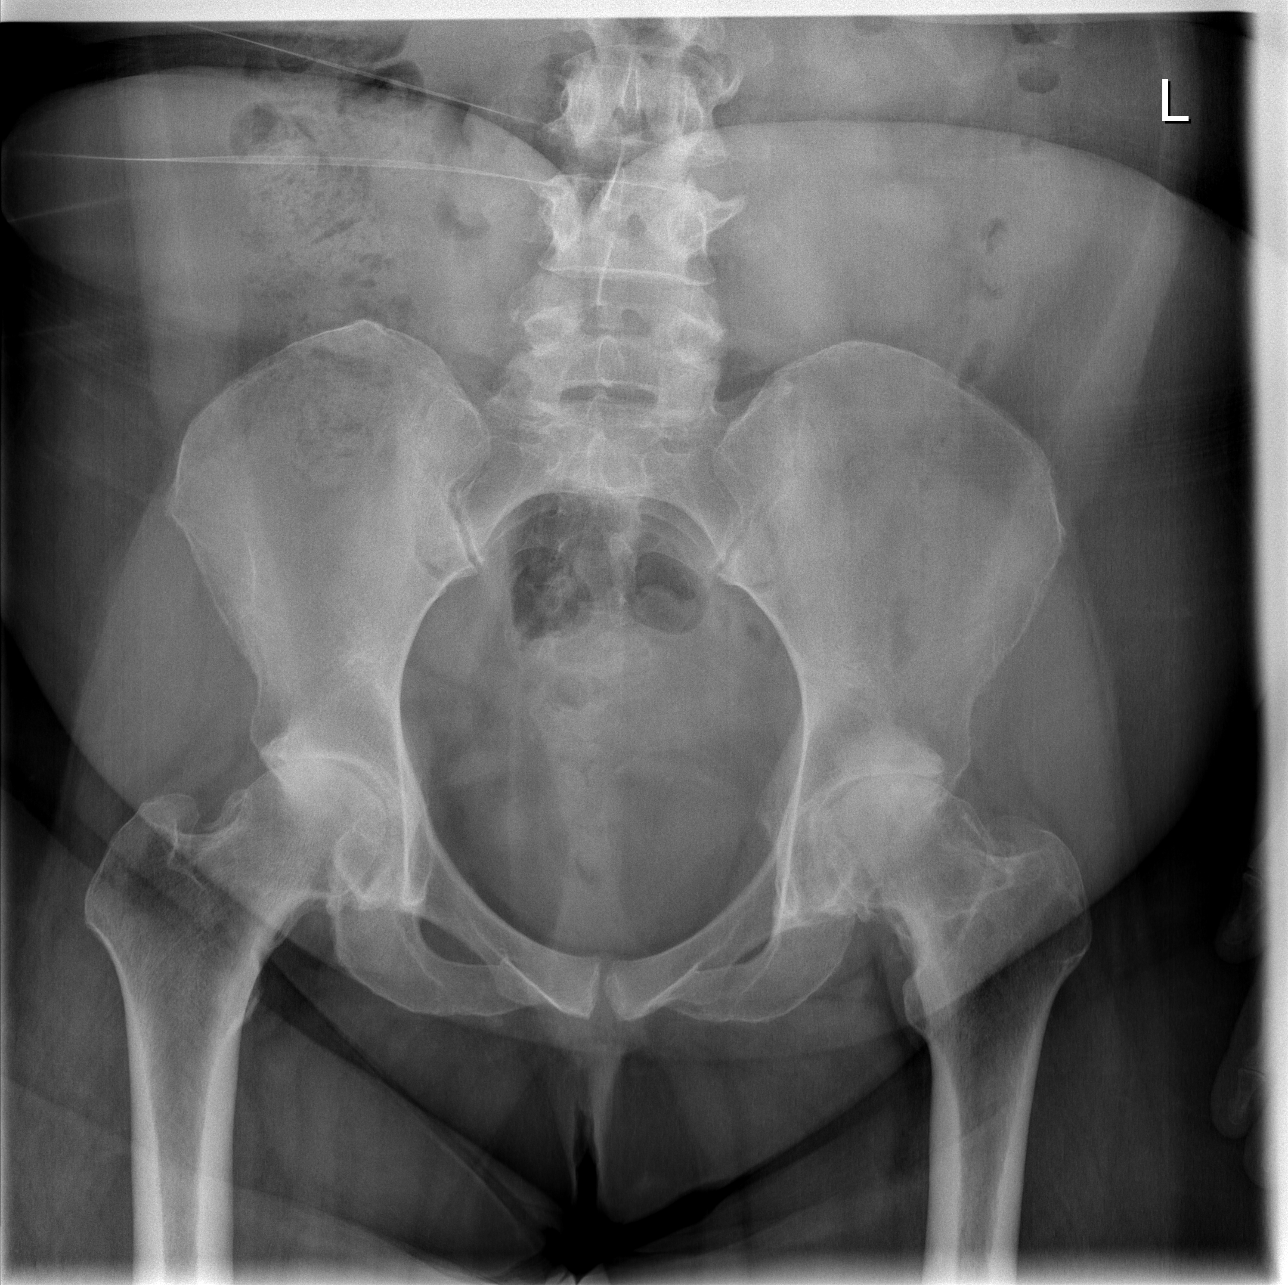

[t hip ap left]
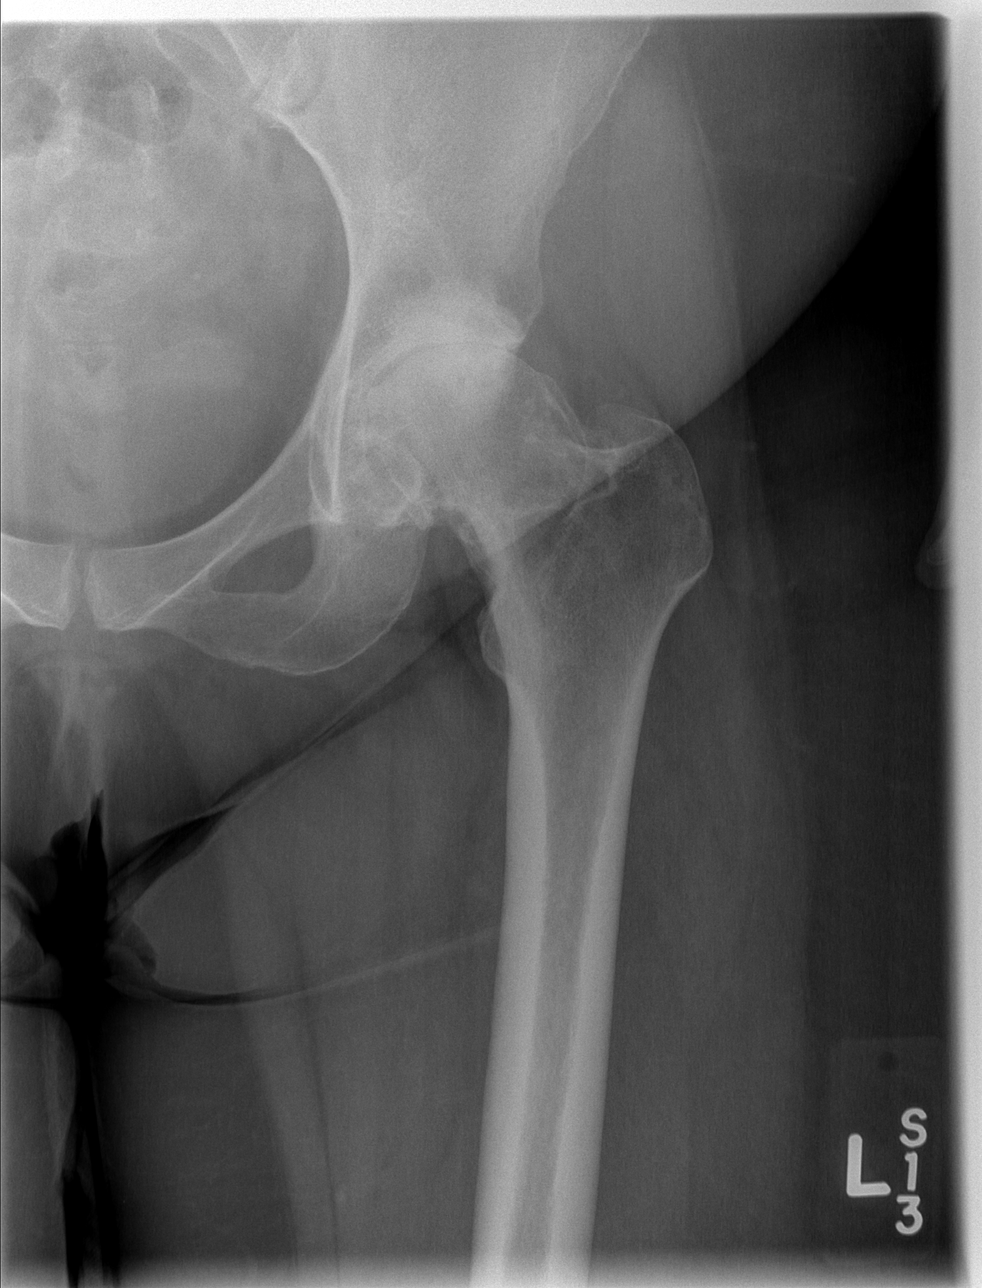

[t hip frog leg left]
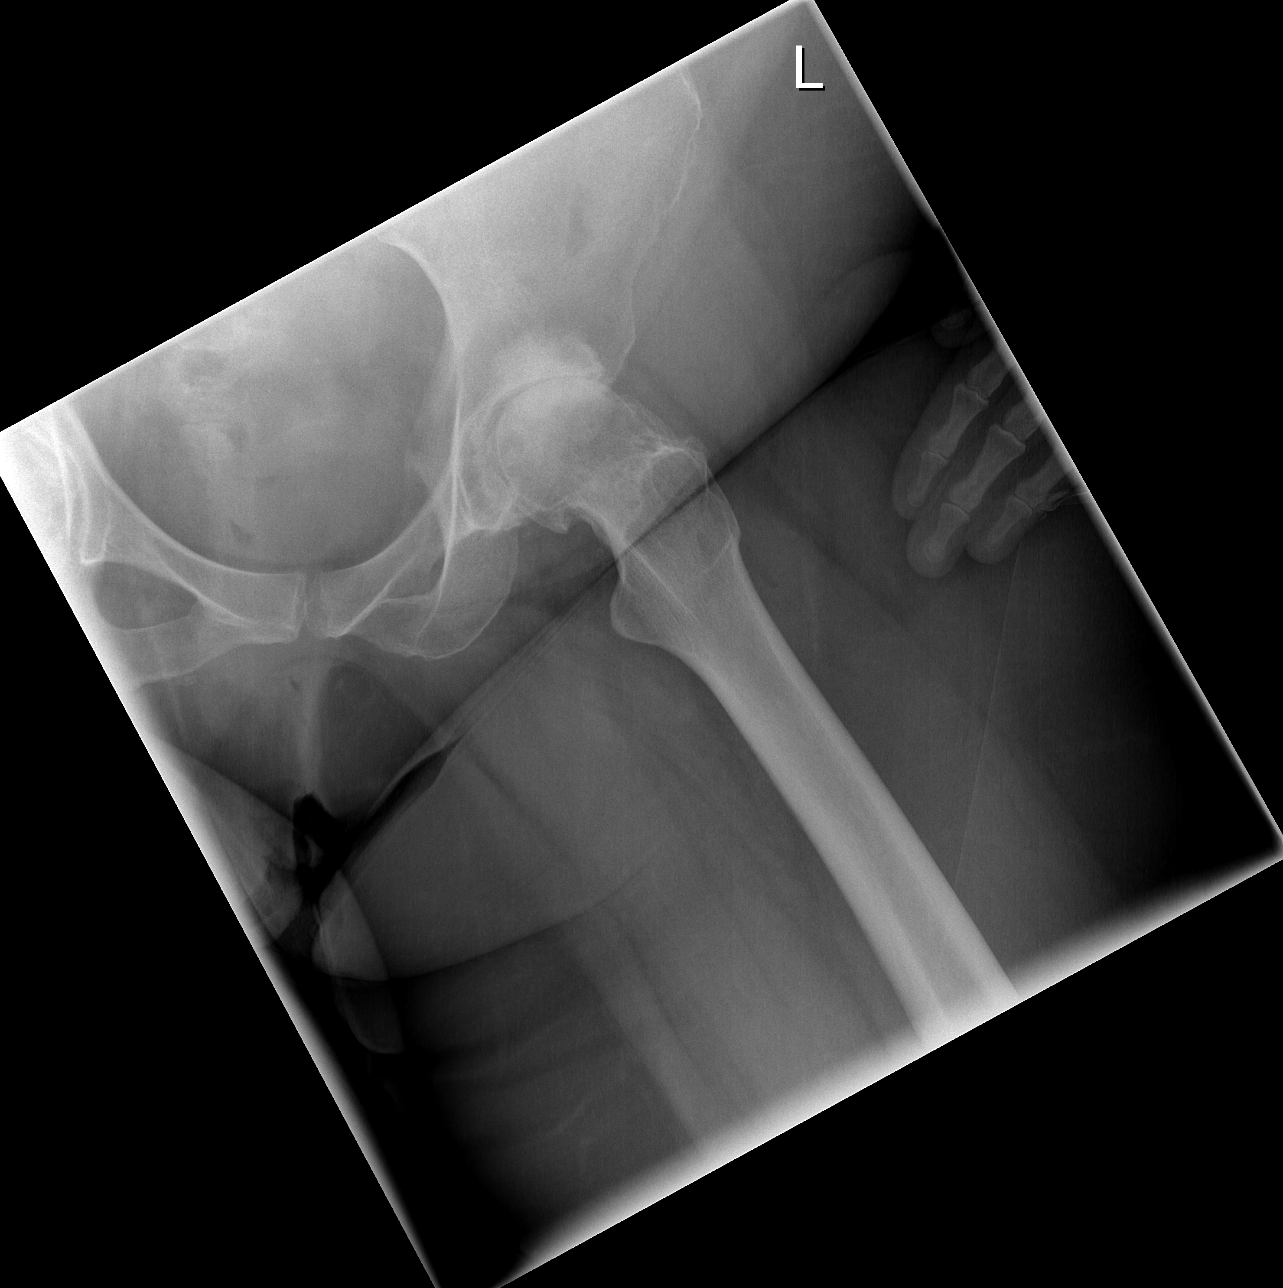

[3 of 3 positions shown; findings below may reference images not displayed]

FINDINGS: Bones appear mildly demineralized.

Osteoarthritic changes of the hip joints bilaterally with joint
space narrowing and spur formation as well as subchondral sclerosis.

No acute fracture, dislocation or bone destruction.

Degenerative disc disease changes lower lumbar spine.

Regional soft tissues unremarkable.
IMPRESSION: Osteoarthritic changes of bilateral hip joints.

## 2015-04-22 IMAGING — CR DG CHEST 2V
2 series · 2 of 2 positions shown · non-contrast
Comparison: None

CLINICAL DATA: Preop for left total hip. Nonsmoker. History of
hypertension.

EXAM:
CHEST  2 VIEW

[w chest pa]
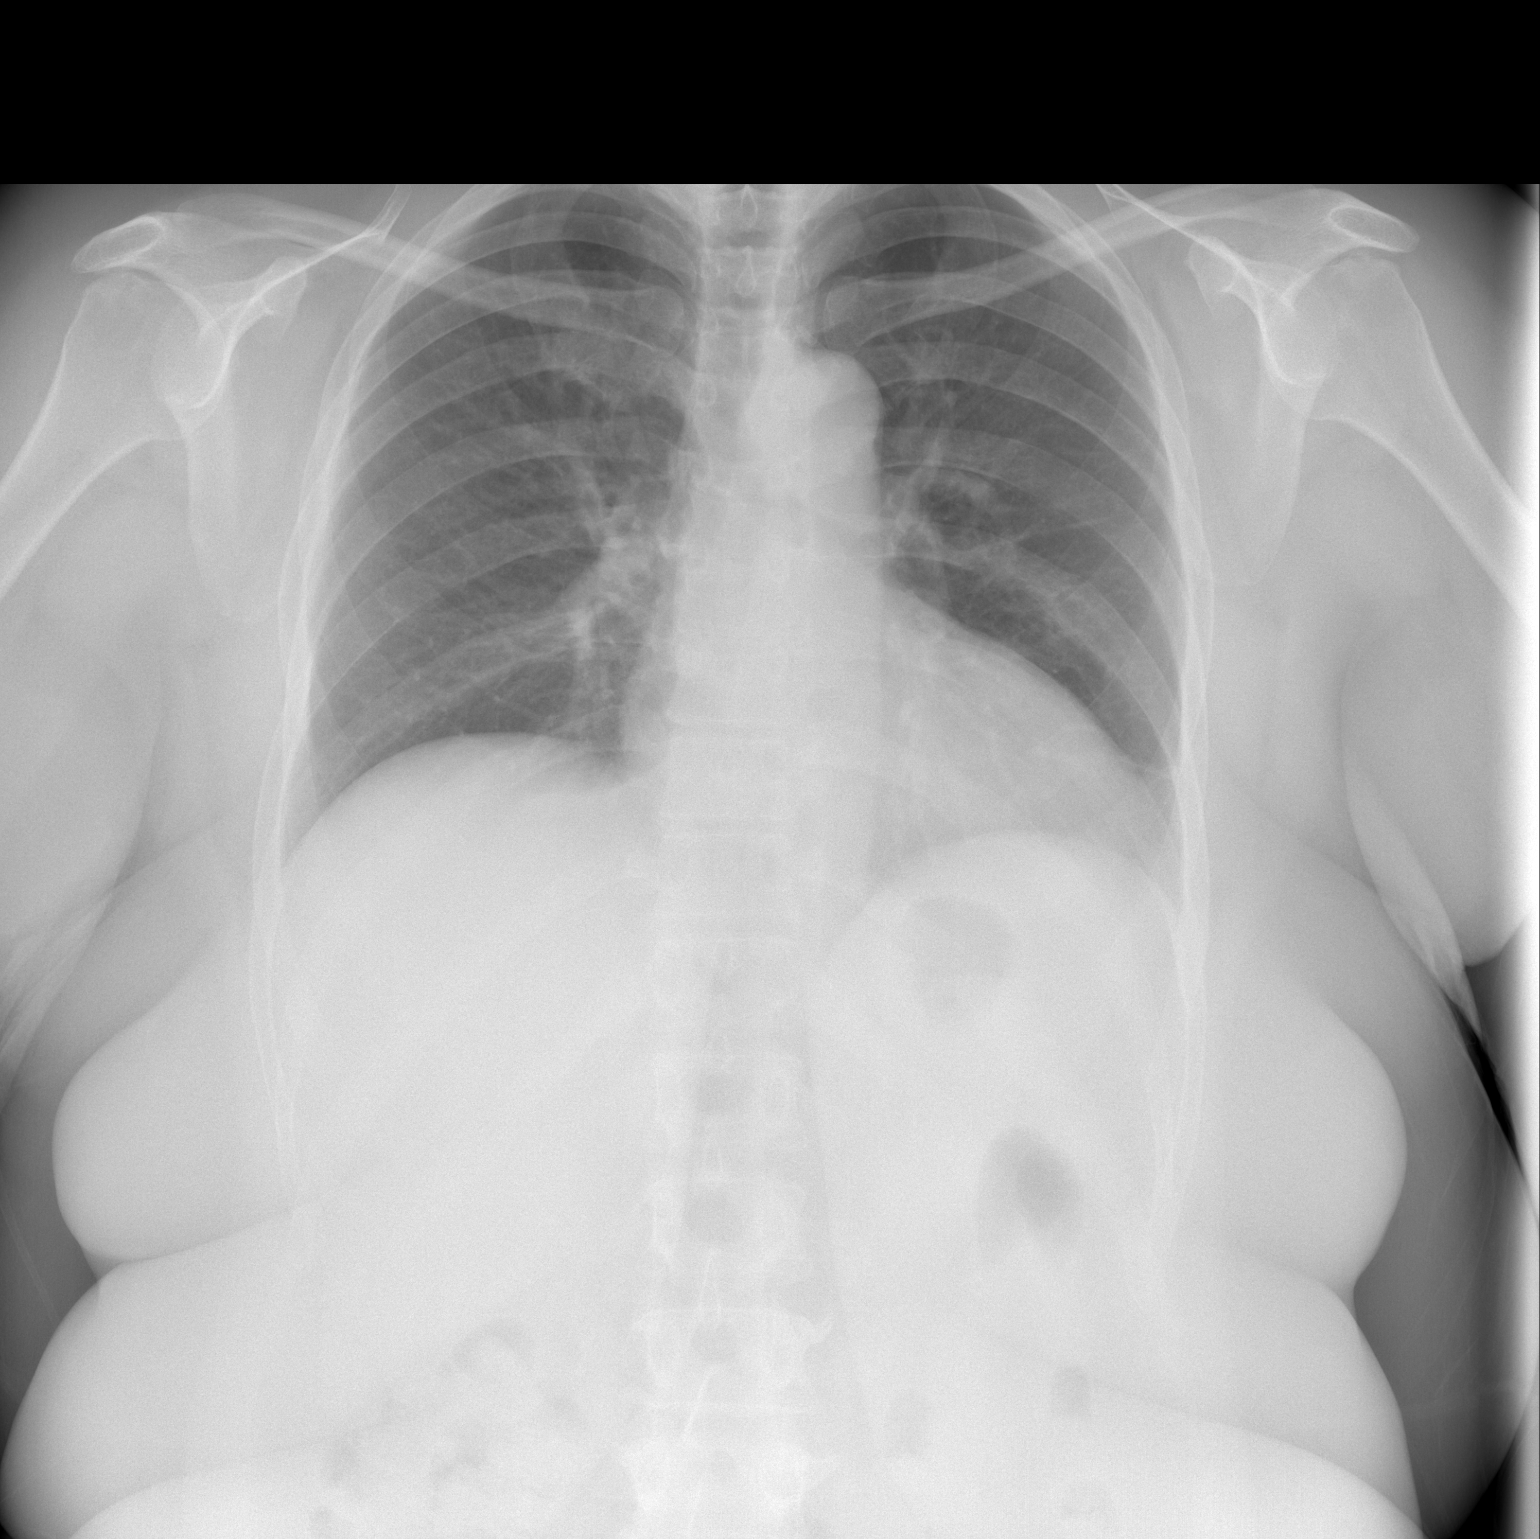

[w chest lat]
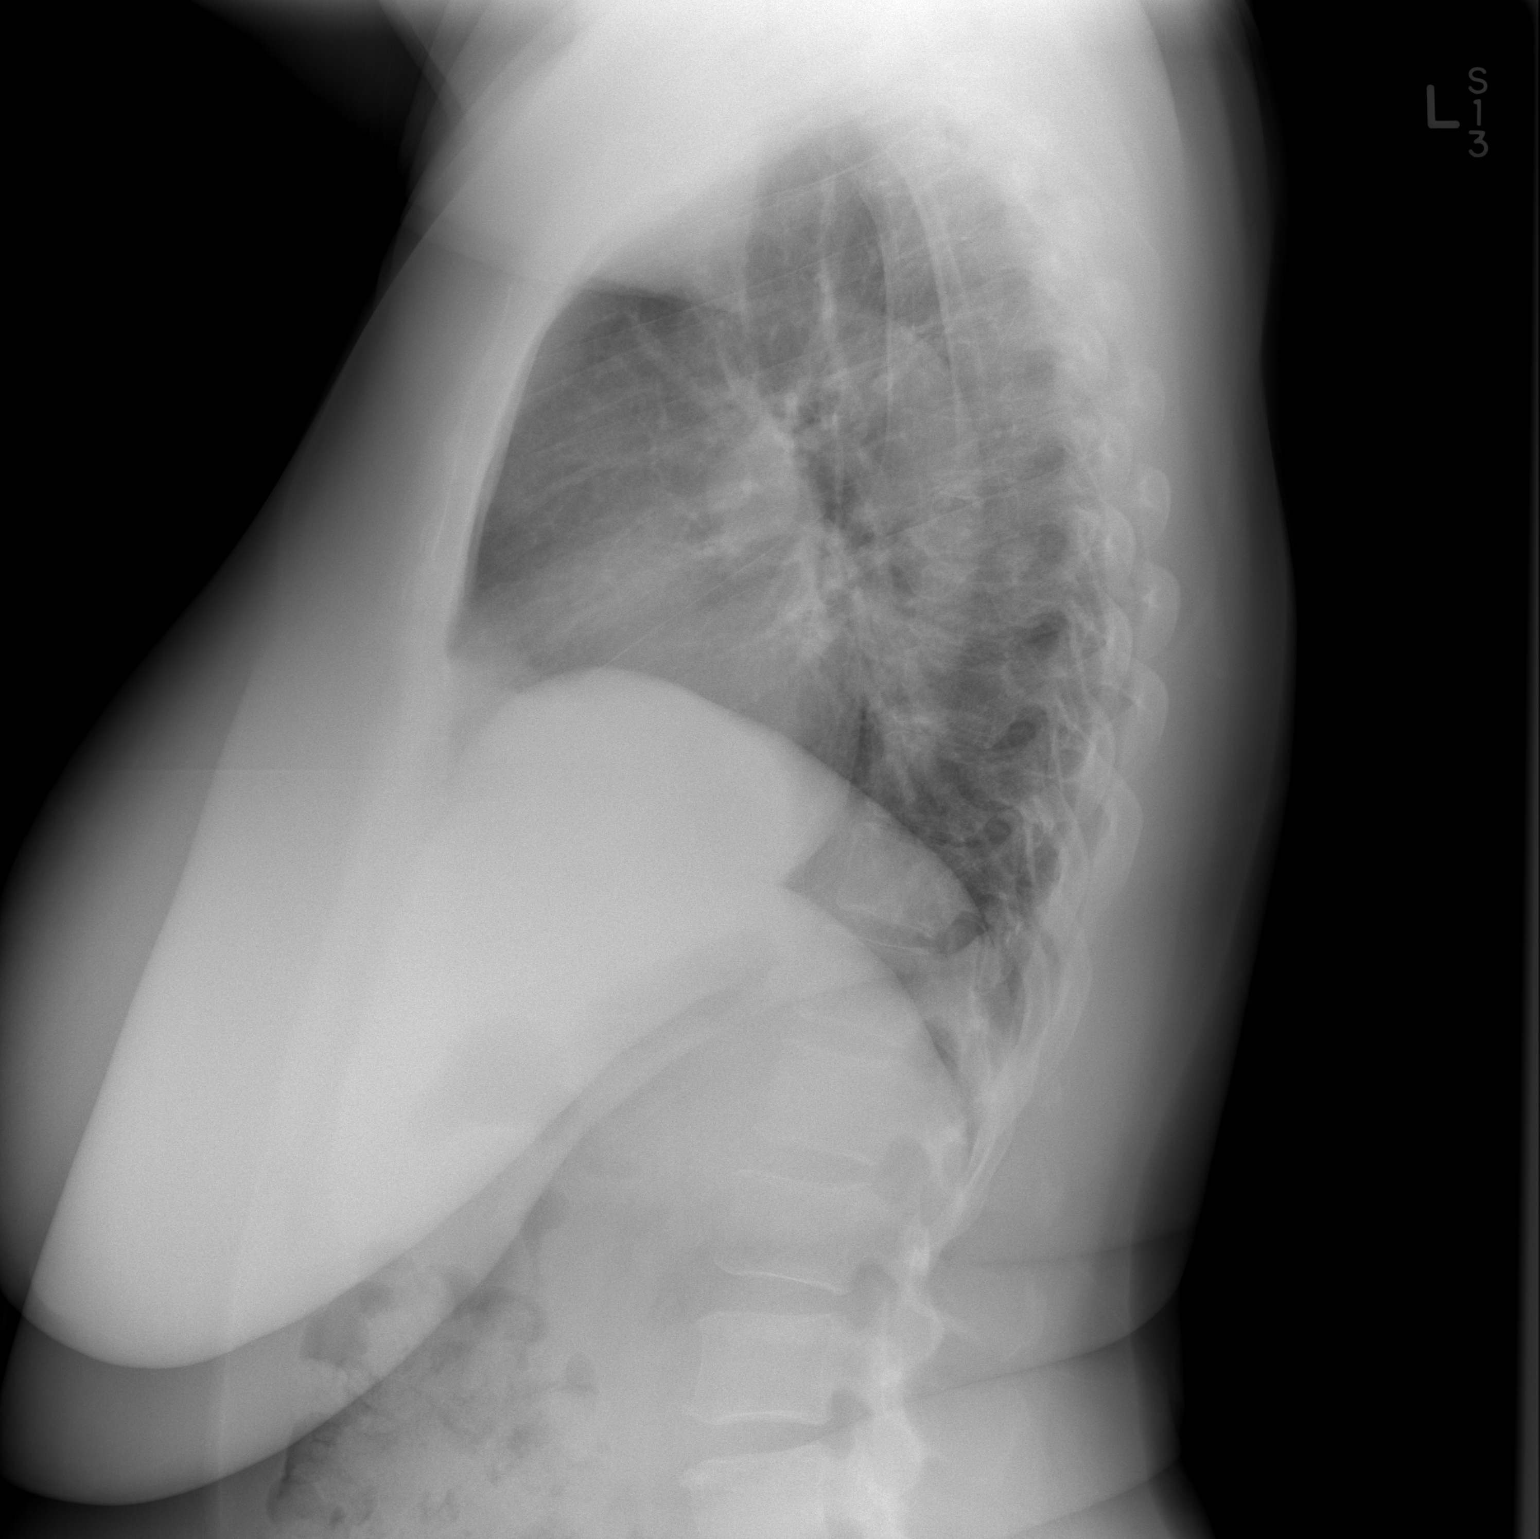

[2 of 2 positions shown; findings below may reference images not displayed]

FINDINGS: The heart is mildly enlarged. There are no focal consolidations or
pleural effusions. No pulmonary edema. Visualized osseous structures
have a normal appearance.
IMPRESSION: 1. Cardiomegaly without pulmonary edema.
2.  No evidence for acute cardiopulmonary abnormality.
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
## 2015-04-24 ENCOUNTER — Ambulatory Visit (INDEPENDENT_AMBULATORY_CARE_PROVIDER_SITE_OTHER): Payer: Medicaid Other | Admitting: Psychology

## 2015-04-25 ENCOUNTER — Encounter (HOSPITAL_COMMUNITY): Payer: Self-pay | Admitting: Psychiatry

## 2015-04-25 ENCOUNTER — Ambulatory Visit (INDEPENDENT_AMBULATORY_CARE_PROVIDER_SITE_OTHER): Payer: Medicaid Other | Admitting: Psychiatry

## 2015-04-25 VITALS — BP 108/81 | HR 68 | Ht 64.0 in | Wt 251.0 lb

## 2015-04-25 DIAGNOSIS — F331 Major depressive disorder, recurrent, moderate: Secondary | ICD-10-CM

## 2015-04-25 MED ORDER — DULOXETINE HCL 60 MG PO CPEP
60.0000 mg | ORAL_CAPSULE | Freq: Two times a day (BID) | ORAL | Status: DC
Start: 2015-04-25 — End: 2015-09-12

## 2015-04-25 MED ORDER — ALPRAZOLAM 1 MG PO TABS: 1.0000 mg | ORAL_TABLET | Freq: Every day | ORAL | Status: DC
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
## 2015-04-28 ENCOUNTER — Telehealth: Payer: Self-pay | Admitting: Family Medicine

## 2015-04-28 ENCOUNTER — Other Ambulatory Visit: Payer: Self-pay

## 2015-04-28 ENCOUNTER — Encounter: Payer: Self-pay | Admitting: Orthopedic Surgery

## 2015-04-28 ENCOUNTER — Ambulatory Visit (INDEPENDENT_AMBULATORY_CARE_PROVIDER_SITE_OTHER): Payer: Medicaid Other | Admitting: Orthopedic Surgery

## 2015-04-28 VITALS — BP 115/73 | Ht 64.0 in | Wt 260.0 lb

## 2015-04-28 DIAGNOSIS — M129 Arthropathy, unspecified: Secondary | ICD-10-CM | POA: Diagnosis not present

## 2015-04-28 DIAGNOSIS — M545 Low back pain, unspecified: Secondary | ICD-10-CM

## 2015-04-28 DIAGNOSIS — M1710 Unilateral primary osteoarthritis, unspecified knee: Secondary | ICD-10-CM

## 2015-04-28 DIAGNOSIS — I10 Essential (primary) hypertension: Secondary | ICD-10-CM

## 2015-04-28 MED ORDER — ATORVASTATIN CALCIUM 20 MG PO TABS: 20.0000 mg | ORAL_TABLET | Freq: Every day | ORAL | Status: DC

## 2015-04-28 NOTE — Telephone Encounter (Signed)
Patient is calling asking for a refill on   atorvastatin (LIPITOR) 20 MG tablet

## 2015-04-28 NOTE — Patient Instructions (Addendum)
CALL [facility]ER[facility] DEPT TO SCHEDULE THERAPY VISITS [phone] 

## 2015-04-28 NOTE — Telephone Encounter (Signed)
I read Dr Diamantina ProvidenceHarrisons note and he wants her to try to lose 30lbs- using medication if necessary and I have printed his last note and put in your box. Please advise

## 2015-04-28 NOTE — Telephone Encounter (Signed)
Terisha stated that Dr. Romeo AppleHarrison was going to ask Dr. Lodema HongSimpson to prescribe a weight loss pill for [place]. Once we receive request, pls call Zyiah so she can go pick it up.

## 2015-04-28 NOTE — Telephone Encounter (Signed)
Med refill sent

## 2015-04-28 NOTE — Addendum Note (Signed)
Addended by: Earnstine RegalKEELING, JOHN W on: 04/28/2015 10:00 PM   Modules accepted: Kipp BroodSmartSet

## 2015-04-28 NOTE — Telephone Encounter (Signed)
Needs TSH and BP verified by nurse here, if both normal then can get phentermine let her know
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
## 2015-04-29 LAB — TSH: TSH: 1.96 m[IU]/L

## 2015-04-29 NOTE — Telephone Encounter (Signed)
Patient aware.

## 2015-04-29 NOTE — Addendum Note (Signed)
Addended by: Abner GreenspanHUDY, Rooney Swails H on: 04/29/2015 08:33 AM   Modules accepted: Orders

## 2015-05-05 ENCOUNTER — Telehealth: Payer: Self-pay | Admitting: Family Medicine

## 2015-05-05 NOTE — Telephone Encounter (Signed)
pls re check her BP if oK then phentermine 37.5 mg daily x 3 months only will need f/u

## 2015-05-05 NOTE — Telephone Encounter (Signed)
Patient is asking for Thyroid Test Results, please advise?

## 2015-05-05 NOTE — Telephone Encounter (Signed)
Will come today or tomorrow for Blood pressure check

## 2015-05-05 NOTE — Telephone Encounter (Signed)
Wants to know since her tsh was normal if she could now pick up the phentermine script?

## 2015-05-05 NOTE — Telephone Encounter (Signed)
Patient is calling asking for Thyroid Test Results, please advise?

## 2015-05-06 ENCOUNTER — Ambulatory Visit: Payer: Medicaid Other

## 2015-05-06 VITALS — BP 120/84 | Wt 259.0 lb

## 2015-05-06 DIAGNOSIS — M549 Dorsalgia, unspecified: Secondary | ICD-10-CM

## 2015-05-06 DIAGNOSIS — I10 Essential (primary) hypertension: Secondary | ICD-10-CM

## 2015-05-06 MED ORDER — HYDROCODONE-ACETAMINOPHEN 10-325 MG PO TABS: ORAL_TABLET | ORAL | Status: DC

## 2015-05-06 MED ORDER — PHENTERMINE HCL 37.5 MG PO TABS: 37.5000 mg | ORAL_TABLET | Freq: Every day | ORAL | Status: DC

## 2015-05-06 NOTE — Progress Notes (Signed)
BP was normal so phentermine rx given. Patient to return at next appt for follow up visit

## 2015-05-07 ENCOUNTER — Ambulatory Visit (HOSPITAL_COMMUNITY): Payer: Medicaid Other | Attending: Orthopedic Surgery | Admitting: Physical Therapy

## 2015-05-07 DIAGNOSIS — R29898 Other symptoms and signs involving the musculoskeletal system: Secondary | ICD-10-CM | POA: Diagnosis present

## 2015-05-07 DIAGNOSIS — R6889 Other general symptoms and signs: Secondary | ICD-10-CM | POA: Diagnosis present

## 2015-05-07 DIAGNOSIS — M545 Low back pain, unspecified: Secondary | ICD-10-CM

## 2015-05-07 DIAGNOSIS — R262 Difficulty in walking, not elsewhere classified: Secondary | ICD-10-CM | POA: Diagnosis present
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
## 2015-05-07 NOTE — Patient Instructions (Signed)
Strengthening: Hip Extension (Prone)    Tighten muscles on front of left thigh, then lift leg _2___ inches from surface, keeping knee locked. Repeat __10__ times per set. Do __1__ sets per session. Do 2____ sessions per day.  http://orth.exer.us/620   Copyright  VHI. All rights reserved.  Opposite Arm / Leg Lift (Prone)    Abdomen and head supported, left knee locked, raise leg and opposite arm _2___ inches from floor. Repeat _10___ times per set. Do ____1 sets per session. Do __2__ sessions per day.  http://orth.exer.us/1114   Copyright  VHI. All rights reserved.   

## 2015-05-07 NOTE — Patient Instructions (Signed)
Strengthening: Hip Extension (Prone)    Tighten muscles on front of left thigh, then lift leg _2___ inches from surface, keeping knee locked. Repeat __10__ times per set. Do __1__ sets per session. Do 2____ sessions per day.  http://orth.exer.us/620   Copyright  VHI. All rights reserved.  Opposite Arm / Leg Lift (Prone)    Abdomen and head supported, left knee locked, raise leg and opposite arm _2___ inches from floor. Repeat _10___ times per set. Do ____1 sets per session. Do __2__ sessions per day.  http://orth.exer.us/1114   Copyright  VHI. All rights reserved.

## 2015-05-09 ENCOUNTER — Encounter (HOSPITAL_COMMUNITY): Payer: Self-pay | Admitting: Physical Therapy

## 2015-05-13 ENCOUNTER — Encounter (HOSPITAL_COMMUNITY): Payer: Self-pay

## 2015-05-15 ENCOUNTER — Encounter (HOSPITAL_COMMUNITY): Payer: Self-pay | Admitting: Physical Therapy

## 2015-05-15 ENCOUNTER — Ambulatory Visit (HOSPITAL_COMMUNITY): Payer: Self-pay | Admitting: Psychology

## 2015-05-21 ENCOUNTER — Ambulatory Visit (HOSPITAL_COMMUNITY): Payer: Self-pay | Admitting: Psychology

## 2015-05-23 ENCOUNTER — Ambulatory Visit (HOSPITAL_COMMUNITY): Payer: Self-pay | Admitting: Psychology

## 2015-05-26 ENCOUNTER — Ambulatory Visit (INDEPENDENT_AMBULATORY_CARE_PROVIDER_SITE_OTHER): Payer: Medicaid Other | Admitting: Orthopedic Surgery

## 2015-05-26 ENCOUNTER — Encounter: Payer: Self-pay | Admitting: Orthopedic Surgery

## 2015-05-26 VITALS — BP 103/70 | Ht 64.0 in | Wt 259.0 lb

## 2015-05-26 DIAGNOSIS — M545 Low back pain, unspecified: Secondary | ICD-10-CM

## 2015-05-26 DIAGNOSIS — M129 Arthropathy, unspecified: Secondary | ICD-10-CM | POA: Diagnosis not present

## 2015-05-26 DIAGNOSIS — M25562 Pain in left knee: Secondary | ICD-10-CM

## 2015-05-26 DIAGNOSIS — M1710 Unilateral primary osteoarthritis, unspecified knee: Secondary | ICD-10-CM

## 2015-05-26 NOTE — Patient Instructions (Signed)
Ideal weight 240 pound

## 2015-05-26 NOTE — Progress Notes (Signed)
Patient ID: Tami RecordsAngeline L Pearson, female   DOB: 03/19/1965, 50 y.o.   MRN: [id number]  Chief Complaint  Patient presents with  . Follow-up    FOLLOW UP BACK S/P THERAPY + LEFT KNEE    HPI  Follow-up for consideration for left total knee, lower back pain status post physical therapy, obesity BMI greater than 40  Patient did start weight loss medication  She went to therapy her back feels much better she now just has left knee pain  ROS   We note no fever, no numbness or tingling, no bowel or bladder dysfunction   BP 103/70 mmHg  Ht 5\' 4"  (1.626 m)  Wt 259 lb (117.482 kg)  BMI 44.44 kg/m2  Physical Exam  Constitutional: She is oriented to person, place, and time. She appears well-developed and well-nourished. No distress.  Cardiovascular: Normal rate and intact distal pulses.   Neurological: She is alert and oriented to person, place, and time. She has normal reflexes. She exhibits normal muscle tone. Coordination normal.  Skin: Skin is warm and dry. No rash noted. She is not diaphoretic. No erythema. No pallor.  Psychiatric: She has a normal mood and affect. Her behavior is normal. Judgment and thought content normal.    Ortho Exam  Left knee tenderness lateral joint line, drawer tests are normal varus valgus stress test normal no erythema no scars normal sensation good pulses no swelling no joint effusion    ASSESSMENT AND PLAN   X-ray again shows 50% loss of joint space and secondary bone changes primarily lateral compartment  Patient is done well with physical therapy to eliminate the back is a pain generator  She is on weight loss medication  Follow-up in a month check her weight 132 at 240 pounds I think we can proceed

## 2015-05-30 ENCOUNTER — Other Ambulatory Visit: Payer: Self-pay

## 2015-05-30 ENCOUNTER — Ambulatory Visit (HOSPITAL_COMMUNITY): Payer: Self-pay | Admitting: Psychology

## 2015-05-30 DIAGNOSIS — M549 Dorsalgia, unspecified: Secondary | ICD-10-CM

## 2015-05-30 MED ORDER — HYDROCODONE-ACETAMINOPHEN 10-325 MG PO TABS: ORAL_TABLET | ORAL | Status: DC

## 2015-06-02 ENCOUNTER — Other Ambulatory Visit: Payer: Self-pay | Admitting: Family Medicine

## 2015-06-02 MED ORDER — VITAMIN D (ERGOCALCIFEROL) 1.25 MG (50000 UNIT) PO CAPS: 50000.0000 [IU] | ORAL_CAPSULE | ORAL | Status: DC

## 2015-06-02 NOTE — Telephone Encounter (Signed)
Med refilled.

## 2015-06-02 NOTE — Telephone Encounter (Signed)
Patient is [facility] for a [facility] [place] on Vitamin D, Ergocalciferol, (DRISDOL) 50000 UNITS CAPS capsule

## 2015-06-03 ENCOUNTER — Encounter: Payer: Self-pay | Admitting: Family Medicine

## 2015-06-03 ENCOUNTER — Ambulatory Visit (INDEPENDENT_AMBULATORY_CARE_PROVIDER_SITE_OTHER): Payer: Medicaid Other | Admitting: Family Medicine

## 2015-06-03 VITALS — BP 128/82 | HR 90 | Resp 16 | Ht 64.0 in | Wt 250.0 lb

## 2015-06-03 DIAGNOSIS — E119 Type 2 diabetes mellitus without complications: Secondary | ICD-10-CM | POA: Diagnosis not present

## 2015-06-03 DIAGNOSIS — Z23 Encounter for immunization: Secondary | ICD-10-CM | POA: Diagnosis not present

## 2015-06-03 DIAGNOSIS — F5102 Adjustment insomnia: Secondary | ICD-10-CM

## 2015-06-03 DIAGNOSIS — F32A Depression, unspecified: Secondary | ICD-10-CM

## 2015-06-03 DIAGNOSIS — E669 Obesity, unspecified: Secondary | ICD-10-CM

## 2015-06-03 DIAGNOSIS — E1169 Type 2 diabetes mellitus with other specified complication: Secondary | ICD-10-CM

## 2015-06-03 DIAGNOSIS — E8881 Metabolic syndrome: Secondary | ICD-10-CM

## 2015-06-03 DIAGNOSIS — I10 Essential (primary) hypertension: Secondary | ICD-10-CM

## 2015-06-03 DIAGNOSIS — Z1211 Encounter for screening for malignant neoplasm of colon: Secondary | ICD-10-CM

## 2015-06-03 DIAGNOSIS — B351 Tinea unguium: Secondary | ICD-10-CM | POA: Diagnosis not present

## 2015-06-03 DIAGNOSIS — F329 Major depressive disorder, single episode, unspecified: Secondary | ICD-10-CM

## 2015-06-03 DIAGNOSIS — E785 Hyperlipidemia, unspecified: Secondary | ICD-10-CM

## 2015-06-03 MED ORDER — PHENTERMINE HCL 37.5 MG PO TABS: 37.5000 mg | ORAL_TABLET | Freq: Every day | ORAL | Status: DC

## 2015-06-03 MED ORDER — TERBINAFINE HCL 250 MG PO TABS: 250.0000 mg | ORAL_TABLET | Freq: Every day | ORAL | Status: DC

## 2015-06-03 NOTE — Assessment & Plan Note (Signed)
After obtaining informed consent, the vaccine is  administered by LPN.  

## 2015-06-03 NOTE — Patient Instructions (Addendum)
F/u in 2 months, call if you need me before  Congrats on weight loss, continue new healthy eating habits  New med for fungal nail infection for 3 months, terbinafine one daily  Fasting labsin 2 months, lipid, cmp and EGFr and hBa1C   You are referred for diabetic eye exam, foot care and colonoscopy Please work on good  health habits so that your health will improve. 1. Commitment to daily physical activity for 30 to 60  minutes, if you are able to do this.  2. Commitment to wise food choices. Aim for half of your  food intake to be vegetable and fruit, one quarter starchy foods, and one quarter protein. Try to eat on a regular schedule  3 meals per day, snacking between meals should be limited to vegetables or fruits or small portions of nuts. 64 ounces of water per day is generally recommended, unless you have specific health conditions, like heart failure or kidney failure where you will need to limit fluid intake.  3. Commitment to sufficient and a  good quality of physical and mental rest daily, generally between 6 to 8 hours per day.  WITH PERSISTANCE AND PERSEVERANCE, THE IMPOSSIBLE , BECOMES THE NORM!   Marland Kitchen Thank you  for choosing [facility]. We consider it a privelige to serve you.  Delivering excellent health care in a caring and  compassionate way is our goal.  Partnering with you,  so that together we can achieve this goal is our strategy.

## 2015-06-03 NOTE — Assessment & Plan Note (Signed)
Improved. Pt applauded on succesful weight loss through lifestyle change, and encouraged to continue same. Weight loss goal set for the next several months.  

## 2015-06-03 NOTE — Progress Notes (Signed)
Subjective:    Patient ID: Rica RecordsAngeline L Andreen, female    DOB: 08/01/1965, 50 y.o.   MRN: [id number]  HPI   Rica Recordsngeline L Swallows     MRN: [id number]      DOB: 04/13/1965   HPI [name] is here for follow up and re-evaluation of chronic medical conditions, medication management and review of any available recent lab and radiology data.  Preventive health is updated, specifically  Cancer screening and Immunization.   Questions or concerns regarding consultations or procedures which the PT has had in the interim are  addressed. The PT denies any adverse reactions to current medications since the last visit.  There are no new concerns.  There are no specific complaints   ROS Denies recent fever or chills. Denies sinus pressure, nasal congestion, ear pain or sore throat. Denies chest congestion, productive cough or wheezing. Denies chest pains, palpitations and leg swelling Denies abdominal pain, nausea, vomiting,diarrhea or constipation.   Denies dysuria, frequency, hesitancy or incontinence. Denies joint pain, swelling and limitation in mobility. Denies headaches, seizures, numbness, or tingling. Denies depression, anxiety or insomnia. Denies skin break down or rash.   PE  BP 128/82 mmHg  Pulse 90  Resp 16  Ht 5\' 4"  (1.626 m)  Wt 250 lb (113.399 kg)  BMI 42.89 kg/m2  SpO2 96%  Patient alert and oriented and in no cardiopulmonary distress.  HEENT: No facial asymmetry, EOMI,   oropharynx pink and moist.  Neck supple no JVD, no mass.  Chest: Clear to auscultation bilaterally.  CVS: S1, S2 no murmurs, no S3.Regular rate.  ABD: Soft non tender.   Ext: No edema  MS: Adequate ROM spine, shoulders, hips and knees.  Skin: Intact, no ulcerations or rash noted.  Psych: Good eye contact, normal affect. Memory intact not anxious or depressed appearing.  CNS: CN 2-12 intact, power,  normal throughout.no focal deficits noted.   Assessment & Plan  Morbid  obesity Improved. Pt applauded on succesful weight loss through lifestyle change, and encouraged to continue same. Weight loss goal set for the next several months.   Need for 23-polyvalent pneumococcal polysaccharide vaccine After obtaining informed consent, the vaccine is  administered by LPN.   Essential hypertension Controlled, no change in medication DASH diet and commitment to daily physical activity for a minimum of 30 minutes discussed and encouraged, as a part of hypertension management. The importance of attaining a healthy weight is also discussed.  BP/Weight 06/03/2015 05/26/2015 05/06/2015 04/28/2015 04/16/2015 04/08/2015 01/15/2015  Systolic BP 128 103 120 115 140 149 130  Diastolic BP 82 70 84 73 100 100 90  Wt. (Lbs) 250 259 259 260 260.4 261.4 262  BMI 42.89 44.44 44.44 44.61 44.68 45.57 46.42  Some encounter information is confidential and restricted. Go to Review Flowsheets activity to see all data.        Diabetes mellitus type 2 in obese (HCC) Controlled, no change in medication [name] is reminded of the importance of commitment to daily physical activity for 30 minutes or more, as able and the need to limit carbohydrate intake to 30 to 60 grams per meal to help with blood sugar control.   The need to take medication as prescribed, test blood sugar as directed, and to call between visits if there is a concern that blood sugar is uncontrolled is also discussed.   [name] is reminded of the importance of daily foot exam, annual eye examination, and good blood sugar, blood pressure and  cholesterol control.  Diabetic Labs Latest Ref Rng 06/03/2015 04/16/2015 04/16/2014 01/30/2014 08/20/2013  HbA1c <5.7 % - 6.6(H) - 6.4(H) 6.4(H)  Microalbumin Not estab mg/dL 1.1 - 1.1 - -  Micro/Creat Ratio <30 mcg/mg creat 4 - 7.9 - -  Chol 125 - 200 mg/dL - 409 - 811(B) -  HDL >=14 mg/dL - 53 - 45 -  Calc LDL <782 mg/dL - 956 - 213(Y) -  Triglycerides <150 mg/dL - 51 - 865 -   Creatinine 0.50 - 1.05 mg/dL - 7.84 - 6.96 2.95   BP/Weight 06/03/2015 05/26/2015 05/06/2015 04/28/2015 04/16/2015 04/08/2015 01/15/2015  Systolic BP 128 103 120 115 140 149 130  Diastolic BP 82 70 84 73 100 100 90  Wt. (Lbs) 250 259 259 260 260.4 261.4 262  BMI 42.89 44.44 44.44 44.61 44.68 45.57 46.42  Some encounter information is confidential and restricted. Go to Review Flowsheets activity to see all data.   Foot/eye exam completion dates 06/03/2015 04/16/2015  Foot Form Completion Done Done         Depression Treated by psychiatry, improving  Hyperlipidemia LDL goal <100 Hyperlipidemia:Low fat diet discussed and encouraged.  Not at goal   Lipid Panel  Lab Results  Component Value Date   CHOL 170 04/16/2015   HDL 53 04/16/2015   LDLCALC 107 04/16/2015   TRIG 51 04/16/2015   CHOLHDL 3.2 04/16/2015        Metabolic syndrome X The increased risk of cardiovascular disease associated with this diagnosis, and the need to consistently work on lifestyle to change this is discussed. Following  a  heart healthy diet ,commitment to 30 minutes of exercise at least 5 days per week, as well as control of blood sugar and cholesterol , and achieving a healthy weight are all the areas to be addressed .   Insomnia due to psychological stress Sleep hygiene reviewed and written information offered also. Prescription sent for  medication needed.       Review of Systems     Objective:   Physical Exam        Assessment & Plan:

## 2015-06-05 LAB — MICROALBUMIN / CREATININE URINE RATIO
Creatinine, Urine: 275 mg/dL (ref 20–320)
MICROALB UR: 1.1 mg/dL
Microalb Creat Ratio: 4 mcg/mg creat (ref <30)

## 2015-06-10 ENCOUNTER — Ambulatory Visit (INDEPENDENT_AMBULATORY_CARE_PROVIDER_SITE_OTHER): Payer: Medicaid Other | Admitting: Psychology

## 2015-06-13 ENCOUNTER — Telehealth: Payer: Self-pay | Admitting: Family Medicine

## 2015-06-13 MED ORDER — CEPHALEXIN 500 MG PO CAPS: 500.0000 mg | ORAL_CAPSULE | Freq: Three times a day (TID) | ORAL | Status: DC

## 2015-06-13 NOTE — Telephone Encounter (Signed)
Patient is calling asking if Dr. Lodema Pearson would call her in an antibiotic for a tooth ache, pain x's 3 days, please advise?

## 2015-06-13 NOTE — Telephone Encounter (Signed)
Patient aware.  Will try to find dentist.

## 2015-06-13 NOTE — Telephone Encounter (Signed)
5 days of keflex sent needs dentist

## 2015-06-14 NOTE — Assessment & Plan Note (Signed)
Treated by psychiatry, improving

## 2015-06-14 NOTE — Assessment & Plan Note (Addendum)
Hyperlipidemia:Low fat diet discussed and encouraged.  Not at goal, no med change  Lipid Panel  Lab Results  Component Value Date   CHOL 170 04/16/2015   HDL 53 04/16/2015   LDLCALC 107 04/16/2015   TRIG 51 04/16/2015   CHOLHDL 3.2 04/16/2015

## 2015-06-14 NOTE — Assessment & Plan Note (Signed)
Controlled, no change in medication DASH diet and commitment to daily physical activity for a minimum of 30 minutes discussed and encouraged, as a part of hypertension management. The importance of attaining a healthy weight is also discussed.  BP/Weight 06/03/2015 05/26/2015 05/06/2015 04/28/2015 04/16/2015 04/08/2015 01/15/2015  Systolic BP 128 103 120 115 140 149 130  Diastolic BP 82 70 84 73 100 100 90  Wt. (Lbs) 250 259 259 260 260.4 261.4 262  BMI 42.89 44.44 44.44 44.61 44.68 45.57 46.42  Some encounter information is confidential and restricted. Go to Review Flowsheets activity to see all data.

## 2015-06-14 NOTE — Assessment & Plan Note (Signed)
medication sent to treat fungal nail infection

## 2015-06-14 NOTE — Assessment & Plan Note (Signed)
Controlled, no change in medication Tami Pearson is reminded of the importance of commitment to daily physical activity for 30 minutes or more, as able and the need to limit carbohydrate intake to 30 to 60 grams per meal to help with blood sugar control.   The need to take medication as prescribed, test blood sugar as directed, and to call between visits if there is a concern that blood sugar is uncontrolled is also discussed.   Tami Pearson is reminded of the importance of daily foot exam, annual eye examination, and good blood sugar, blood pressure and cholesterol control.  Diabetic Labs Latest Ref Rng 06/03/2015 04/16/2015 04/16/2014 01/30/2014 08/20/2013  HbA1c <5.7 % - 6.6(H) - 6.4(H) 6.4(H)  Microalbumin Not estab mg/dL 1.1 - 1.1 - -  Micro/Creat Ratio <30 mcg/mg creat 4 - 7.9 - -  Chol 125 - 200 mg/dL - 161170 - 096(E202(H) -  HDL >=45>=46 mg/dL - 53 - 45 -  Calc LDL <409<130 mg/dL - 811107 - 914(N129(H) -  Triglycerides <150 mg/dL - 51 - 829142 -  Creatinine 0.50 - 1.05 mg/dL - 5.620.90 - 1.300.87 8.650.97   BP/Weight 06/03/2015 05/26/2015 05/06/2015 04/28/2015 04/16/2015 04/08/2015 01/15/2015  Systolic BP 128 103 120 115 140 149 130  Diastolic BP 82 70 84 73 100 100 90  Wt. (Lbs) 250 259 259 260 260.4 261.4 262  BMI 42.89 44.44 44.44 44.61 44.68 45.57 46.42  Some encounter information is confidential and restricted. Go to Review Flowsheets activity to see all data.   Foot/eye exam completion dates 06/03/2015 04/16/2015  Foot Form Completion Done Done

## 2015-06-14 NOTE — Assessment & Plan Note (Signed)
Sleep hygiene reviewed and written information offered also. Prescription sent for  medication needed.  

## 2015-06-14 NOTE — Assessment & Plan Note (Signed)
The increased risk of cardiovascular disease associated with this diagnosis, and the need to consistently work on lifestyle to change this is discussed. Following  a  heart healthy diet ,commitment to 30 minutes of exercise at least 5 days per week, as well as control of blood sugar and cholesterol , and achieving a healthy weight are all the areas to be addressed .  

## 2015-06-22 ENCOUNTER — Other Ambulatory Visit (HOSPITAL_COMMUNITY): Payer: Self-pay | Admitting: Psychiatry

## 2015-06-26 ENCOUNTER — Ambulatory Visit: Payer: Self-pay | Admitting: Orthopedic Surgery

## 2015-06-26 ENCOUNTER — Telehealth: Payer: Self-pay

## 2015-06-26 NOTE — Telephone Encounter (Signed)
Pt received letter from DS to set up her colonoscopy, Please call [phone]

## 2015-06-27 NOTE — Telephone Encounter (Signed)
Pt did not have her info. She will call tues.

## 2015-07-01 ENCOUNTER — Telehealth: Payer: Self-pay

## 2015-07-01 ENCOUNTER — Other Ambulatory Visit: Payer: Self-pay

## 2015-07-01 DIAGNOSIS — M549 Dorsalgia, unspecified: Secondary | ICD-10-CM

## 2015-07-01 MED ORDER — HYDROCODONE-ACETAMINOPHEN 10-325 MG PO TABS: ORAL_TABLET | ORAL | Status: DC

## 2015-07-01 NOTE — Telephone Encounter (Signed)
[phone]  PATIENT RECEIVED LETTER TO SCHEDULE TCS.  SHE HAS HER INFORMATION AND IS READY TO SCHEDULE

## 2015-07-01 NOTE — Telephone Encounter (Signed)
LMOM for a return call.  

## 2015-07-02 ENCOUNTER — Encounter (HOSPITAL_COMMUNITY): Payer: Self-pay | Admitting: Psychology

## 2015-07-02 ENCOUNTER — Telehealth: Payer: Self-pay

## 2015-07-02 ENCOUNTER — Ambulatory Visit (INDEPENDENT_AMBULATORY_CARE_PROVIDER_SITE_OTHER): Payer: Medicaid Other | Admitting: Psychology

## 2015-07-02 DIAGNOSIS — F331 Major depressive disorder, recurrent, moderate: Secondary | ICD-10-CM

## 2015-07-02 NOTE — Telephone Encounter (Signed)
See note of 07/02/2015.

## 2015-07-02 NOTE — Telephone Encounter (Signed)
Noted  

## 2015-07-02 NOTE — Progress Notes (Signed)
Patient:  Tami Pearson   DOB: 03/29/1965  MR Number: [id number]  [facility]: [facility] [address] [place] [address] Dept: [phone]  Start: 9 AM End: 10 AM  Provider/Observer:     Hershal CoriaJohn R Cebert Dettmann PSYD  Chief Complaint:      Chief Complaint  Patient presents with  . Depression    Reason For Service:   The patient is a 50 year old married female who currently lives with her mother-in-law and her [place] the [place]. Patient was initially referred by Dr. Lodema HongSimpson due to ongoing depression. She reports that she has dealt with depression throughout her life. She reports a very tumultuous childhood with conflicts between her and her siblings. She reports at least one incident where she attempted suicide through overdosing when she was younger and also had a suicide attempt in 2006 after finding out her husband had a child with another woman. She reports that her husband continues to have extramarital relationships continuing to this day.  The patient reports that she and her mother became very close and she experienced increased depression after her mother's death in 72015 from cancer. The patient is ongoing symptoms include significant crying spells, social withdrawal, feelings of helplessness and hopelessness, and negative impact on her attention concentration other cognitive functioning. The patient describes fatigue and feelings of helplessness and hopelessness. She continues to report passive suicidal ideation but denies a plan. She does not have any support through her siblings as they were very poor relationship.  Interventions Strategy:  Cognitive/behavioral psychotherapeutic interventions   Participation Level:   Active  Participation Quality:  Appropriate      Behavioral Observation:  Well Groomed, Alert, and Depressed.   Current Psychosocial Factors: The patient reports  that she has been working on Pharmacologistcoping skills.  Not being around others due to mood, not wanting to be around others.     Content of Session:   Review current symptoms and work on therapeutic interventions around building better coping skills for recurrent sustained issues of depression.   Current Status:   The patient reports continued symptoms of depression but some of the stress has been lifted to to getting her SSD approved.     Patient Progress:   Stable   Target Goals:   Target goals include improving coping and adaptive skills around issues of recurrent depression.    Last Reviewed:   03/27/2015   Goals Addressed Today:    Today we worked on building initial coping skills around issues of depression and isolation.   Impression/Diagnosis:   The patient is long history of recurrent major depressive issues that have been exacerbated by the death of her mother in [address]2015. Ongoing psychosocial stressors include lack of support within her family and ongoing extramarital affairs by her husband.   Diagnosis:    Axis I: Major depressive disorder, recurrent episode, moderate (HCC)     Hillari Zumwalt R, PsyD 07/02/2015

## 2015-07-02 NOTE — Telephone Encounter (Signed)
See note of 07/02/2015. Pt wants to wait until she sees Dr. Romeo AppleHarrison about a problem.

## 2015-07-02 NOTE — Telephone Encounter (Signed)
Pt called this afternoon. She received a letter from DS to be triaged. She has appt with Dr Romeo AppleHarrison on June 13th and she wants to wait until she knows what he wants to do first before scheduling procedure with us. [phone]

## 2015-07-03 ENCOUNTER — Telehealth: Payer: Self-pay | Admitting: Family Medicine

## 2015-07-03 NOTE — Telephone Encounter (Signed)
Can collect 6/13. Pt aware

## 2015-07-03 NOTE — Telephone Encounter (Signed)
Mykel is asking for a refill on gabapentin (NEURONTIN) 400 MG capsule [facility] is trying to tell her that its to early, please advise?

## 2015-07-15 ENCOUNTER — Telehealth: Payer: Self-pay

## 2015-07-15 ENCOUNTER — Ambulatory Visit (INDEPENDENT_AMBULATORY_CARE_PROVIDER_SITE_OTHER): Payer: Medicaid Other | Admitting: Orthopedic Surgery

## 2015-07-15 VITALS — BP 119/86 | HR 79 | Ht 64.0 in | Wt 240.6 lb

## 2015-07-15 DIAGNOSIS — M1710 Unilateral primary osteoarthritis, unspecified knee: Secondary | ICD-10-CM

## 2015-07-15 DIAGNOSIS — M129 Arthropathy, unspecified: Secondary | ICD-10-CM

## 2015-07-15 NOTE — Addendum Note (Signed)
Addended by: Adella HareBOOTHE, JAIME B on: 07/15/2015 03:15 PM   Modules accepted: Orders

## 2015-07-15 NOTE — Telephone Encounter (Signed)
Pt left VM that she would like a call back tomorrow @ [phone] to schedule a colonoscopy.

## 2015-07-15 NOTE — Progress Notes (Signed)
Chief Complaint  Patient presents with  . Follow-up    Left knee    The patient was consented for left total knee replacement had lower back pain we sent her to physical therapy. She did well with physical therapy her back pain is much improved.  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 She was advised to try to lose some weight for her BMI of 44  BP 119/86 mmHg  Pulse 79  Ht 5\' 4"  (1.626 m)  Wt 240 lb 9.6 oz (109.135 kg)  BMI 41.28 kg/m2  She walks with a cane The lateral joint line is tender in the left knee to drawer tests are normal the varus valgus tests remain normal. The skin looks good there is no erythema. No scars are noted. She has normal sensation and good pulses in the distal part of left foot there is no swelling or joint effusion  Her x-ray shows a 50% loss of joint space and secondary bone changes in the lateral compartment  Diagnosis osteoarthritis left knee Diagnosis lower back pain improved  Plan is for left total knee replacement  Risks and benefits of surgery were reviewed  This procedure has been fully reviewed with the patient and written informed consent has been obtained.

## 2015-07-15 NOTE — Patient Instructions (Signed)
Total Knee Replacement Total knee replacement is a procedure to replace your knee joint with an artificial knee joint (prosthetic knee joint). The purpose of this surgery is to reduce pain and improve your knee function. LET YOUR HEALTH CARE PROVIDER KNOW ABOUT:   Any allergies you have.  All medicines you are taking, including vitamins, herbs, eye drops, creams, and over-the-counter medicines.  Previous problems you or members of your family have had with the use of anesthetics.  Any blood disorders you have.  Previous surgeries you have had.  Medical conditions you have. RISKS AND COMPLICATIONS  Generally, total knee replacement is a safe procedure. However, problems can occur, including:  Loss of range of motion of the knee or instability of the knee.  Loosening of the prosthesis.  Infection.  Persistent pain. BEFORE THE PROCEDURE   Plan to have someone take you home after the procedure.  Do not eat or drink anything after midnight on the night before the procedure or as directed by your health care provider.  Ask your health care provider about:  Changing or stopping your regular medicines. This is especially important if you are taking diabetes medicines or blood thinners.  Taking medicines such as aspirin and ibuprofen. These medicines can thin your blood. Do not take these medicines before your procedure if your health care provider asks you not to.  Ask your health care provider about how your surgical site will be marked or identified.  You may be given antibiotic medicines to help prevent infection. PROCEDURE   To reduce your risk of infection:  Your health care team will wash or sanitize their hands.  Your skin will be washed with soap.  An IV tube will be inserted into one of your veins. You will be given one or more of the following:  A medicine that makes you drowsy (sedative).  A medicine that makes you fall asleep (general anesthetic).  A medicine  injected into your spine that numbs your body below the waist (spinal anesthetic).  A medicine to block feeling in your leg (nerve block) to help ease pain after surgery.  An incision will be made in your knee. Your surgeon will take out any damaged cartilage and bone by sawing off the damaged surfaces.  The surgeon will then put a new metal liner over the sawed-off portion of your thigh bone (femur) and a plastic liner over the sawed-off portion of one of the bones of your lower leg (tibia). This is to restore alignment and function to your knee. A plastic piece is often used to restore the surface of your knee cap. AFTER THE PROCEDURE   You will stay in a recovery area until your medicines wear off.  You may have drainage tubes to drain excess fluid from your knee. These tubes attach to a device that removes these fluids.  Once you are awake, stable, and taking fluids well, you will be taken to your hospital room.  You may be directed to take actions to help prevent blood clots. These may include:  Walking shortly after surgery, with someone assisting you. Moving around after surgery helps to improve blood flow.  Wearing compression stockings or using different types of devices.  You will receive physical therapy as prescribed by your health care provider.   This information is not intended to replace advice given to you by your health care provider. Make sure you discuss any questions you have with your health care provider.   Document Released: 04/26/2000   Document Revised: 10/09/2014 Document Reviewed: 02/28/2011 Elsevier Interactive Patient Education 2016 [employer] Inc[employer]  

## 2015-07-21 ENCOUNTER — Telehealth (HOSPITAL_COMMUNITY): Payer: Self-pay | Admitting: *Deleted

## 2015-07-21 NOTE — Telephone Encounter (Signed)
phone call from patient, she was taken off the Xanax 5 mg., but the pharmacy filled that one last month.   now they said they can't fill the new one for 1 mg.  they said they need approval from Dr. Tenny Crawoss.

## 2015-07-21 NOTE — Telephone Encounter (Signed)
Left message to call due to previous phone call

## 2015-07-21 NOTE — Telephone Encounter (Signed)
Pt verbalized understanding.

## 2015-07-21 NOTE — Telephone Encounter (Signed)
Called pt pharmacy due to pt previous phone call. Per Phil DoppSelena, she spoke with the pharmacist that was talking with pt this morning and the situation has been resolved and pt medication is ready for pick up. Pt called office back due to previous phone call. Informed pt that her script was ready for pick up.

## 2015-07-24 ENCOUNTER — Telehealth: Payer: Self-pay

## 2015-07-24 NOTE — Telephone Encounter (Signed)
Phone number not working.

## 2015-07-24 NOTE — Telephone Encounter (Signed)
Noted  

## 2015-07-24 NOTE — Telephone Encounter (Signed)
PT was triaged. She is not having any problems but will need an OV prior to scheduling colonoscopy due to her meds. She is scheduled for a knee replacement in early August and will wait until after that. I have her on a reminder list for October 2017.

## 2015-07-25 ENCOUNTER — Ambulatory Visit (HOSPITAL_COMMUNITY): Payer: Self-pay | Admitting: Psychiatry

## 2015-07-30 ENCOUNTER — Encounter: Payer: Self-pay | Admitting: Family Medicine

## 2015-07-30 ENCOUNTER — Ambulatory Visit (INDEPENDENT_AMBULATORY_CARE_PROVIDER_SITE_OTHER): Payer: Medicaid Other | Admitting: Family Medicine

## 2015-07-30 VITALS — BP 108/82 | HR 99 | Resp 16 | Ht 64.0 in | Wt 245.0 lb

## 2015-07-30 DIAGNOSIS — J309 Allergic rhinitis, unspecified: Secondary | ICD-10-CM | POA: Insufficient documentation

## 2015-07-30 DIAGNOSIS — E785 Hyperlipidemia, unspecified: Secondary | ICD-10-CM | POA: Diagnosis not present

## 2015-07-30 DIAGNOSIS — E669 Obesity, unspecified: Secondary | ICD-10-CM

## 2015-07-30 DIAGNOSIS — F32A Depression, unspecified: Secondary | ICD-10-CM

## 2015-07-30 DIAGNOSIS — E119 Type 2 diabetes mellitus without complications: Secondary | ICD-10-CM | POA: Diagnosis not present

## 2015-07-30 DIAGNOSIS — F329 Major depressive disorder, single episode, unspecified: Secondary | ICD-10-CM | POA: Diagnosis not present

## 2015-07-30 DIAGNOSIS — M170 Bilateral primary osteoarthritis of knee: Secondary | ICD-10-CM | POA: Diagnosis not present

## 2015-07-30 DIAGNOSIS — I10 Essential (primary) hypertension: Secondary | ICD-10-CM

## 2015-07-30 DIAGNOSIS — J3089 Other allergic rhinitis: Secondary | ICD-10-CM | POA: Diagnosis not present

## 2015-07-30 DIAGNOSIS — E1169 Type 2 diabetes mellitus with other specified complication: Secondary | ICD-10-CM

## 2015-07-30 MED ORDER — MONTELUKAST SODIUM 10 MG PO TABS: 10.0000 mg | ORAL_TABLET | Freq: Every day | ORAL | Status: DC

## 2015-07-30 MED ORDER — KETOROLAC TROMETHAMINE 60 MG/2ML IM SOLN
60.0000 mg | Freq: Once | INTRAMUSCULAR | Status: AC
  Administered 2015-07-30: 60 mg via INTRAMUSCULAR

## 2015-07-30 MED ORDER — METHYLPREDNISOLONE ACETATE 80 MG/ML IJ SUSP
80.0000 mg | Freq: Once | INTRAMUSCULAR | Status: AC
  Administered 2015-07-30: 80 mg via INTRAMUSCULAR

## 2015-07-30 MED ORDER — PREDNISONE 5 MG (21) PO TBPK: 5.0000 mg | ORAL_TABLET | ORAL | Status: DC

## 2015-07-30 NOTE — Assessment & Plan Note (Signed)
Uncontrolled.Toradol and depo medrol administered IM in the office , to be followed by a short course of oral prednisone and NSAIDS.  

## 2015-07-30 NOTE — Patient Instructions (Signed)
F/u in 3 .5 month, call if you need me sooner  Injections in office today for pain, prednisone sent in  July 11, new pain script to be collected for oxycodone, NOT hydrocodone as discussed,   Updated pain contract today  hBA1C and chem 7 today

## 2015-07-31 LAB — HEMOGLOBIN A1C
Hgb A1c MFr Bld: 6.4 % — ABNORMAL HIGH (ref <5.7)
MEAN PLASMA GLUCOSE: 137 mg/dL

## 2015-07-31 LAB — BASIC METABOLIC PANEL
BUN: 19 mg/dL (ref 7–25)
CALCIUM: 9.5 mg/dL (ref 8.6–10.4)
CO2: 26 mmol/L (ref 20–31)
Chloride: 102 mmol/L (ref 98–110)
Creat: 1.06 mg/dL — ABNORMAL HIGH (ref 0.50–1.05)
Glucose, Bld: 88 mg/dL (ref 65–99)
POTASSIUM: 3.6 mmol/L (ref 3.5–5.3)
SODIUM: 138 mmol/L (ref 135–146)

## 2015-08-01 ENCOUNTER — Ambulatory Visit (HOSPITAL_COMMUNITY): Payer: Self-pay | Admitting: Psychology

## 2015-08-04 ENCOUNTER — Ambulatory Visit: Payer: Self-pay | Admitting: Family Medicine

## 2015-08-08 ENCOUNTER — Other Ambulatory Visit: Payer: Self-pay

## 2015-08-08 DIAGNOSIS — M170 Bilateral primary osteoarthritis of knee: Secondary | ICD-10-CM

## 2015-08-08 MED ORDER — OXYCODONE-ACETAMINOPHEN 10-325 MG PO TABS: 1.0000 | ORAL_TABLET | ORAL | Status: DC | PRN

## 2015-08-10 NOTE — Assessment & Plan Note (Signed)
Controlled, no change in medication [name] is reminded of the importance of commitment to daily physical activity for 30 minutes or more, as able and the need to limit carbohydrate intake to 30 to 60 grams per meal to help with blood sugar control.   The need to take medication as prescribed, test blood sugar as directed, and to call between visits if there is a concern that blood sugar is uncontrolled is also discussed.   [name] is reminded of the importance of daily foot exam, annual eye examination, and good blood sugar, blood pressure and cholesterol control.  Diabetic Labs Latest Ref Rng 07/30/2015 06/03/2015 04/16/2015 04/16/2014 01/30/2014  HbA1c <5.7 % 6.4(H) - 6.6(H) - 6.4(H)  Microalbumin Not estab mg/dL - 1.1 - 1.1 -  Micro/Creat Ratio <30 mcg/mg creat - 4 - 7.9 -  Chol 125 - 200 mg/dL - - 161170 - 096(E202(H)  HDL >=45>=46 mg/dL - - 53 - 45  Calc LDL <409<130 mg/dL - - 811107 - 914(N129(H)  Triglycerides <150 mg/dL - - 51 - 829142  Creatinine 0.50 - 1.05 mg/dL 5.62(Z1.06(H) - 3.080.90 - 6.570.87   BP/Weight 07/30/2015 07/15/2015 06/03/2015 05/26/2015 05/06/2015 04/28/2015 04/16/2015  Systolic BP 108 119 128 103 120 115 140  Diastolic BP 82 86 82 70 84 73 846100  Wt. (Lbs) 245 240.6 250 259 259 260 260.4  BMI 42.03 41.28 42.89 44.44 44.44 44.61 44.68  Some encounter information is confidential and restricted. Go to Review Flowsheets activity to see all data.   Foot/eye exam completion dates 06/03/2015 04/16/2015  Foot Form Completion Done Done

## 2015-08-10 NOTE — Progress Notes (Signed)
Tami Pearson     MRN: [id number]      DOB: Apr 18, 1965   HPI Tami Pearson is here for follow up and re-evaluation of chronic medical conditions, medication management and review of any available recent lab and radiology data.  Preventive health is updated, specifically  Cancer screening and Immunization.   Questions or concerns regarding consultations or procedures which the PT has had in the interim are  addressed. The PT denies any adverse reactions to current medications since the last visit.  C/o increased and uncontrolled right knee pain , prevent s her from sleep. Increased stress, depression and anxiety, essentially homeless, however ger sister has allowed he to stay in her prior home for the time being, her spouse hwo she has been having problems with as far as infidelity has not moved in with her but maintains contact   ROS Denies recent fever or chills. Denies sinus pressure, nasal congestion, ear pain or sore throat. Denies chest congestion, productive cough or wheezing. Denies chest pains, palpitations and leg swelling Denies abdominal pain, nausea, vomiting,diarrhea or constipation.   Denies dysuria, frequency, hesitancy or incontinence.  Denies headaches, seizures, numbness, or tingling.  Denies skin break down or rash.   PE  BP 108/82 mmHg  Pulse 99  Resp 16  Ht  (1.626 m)  Wt 245 lb (111.131 kg)  BMI 42.03 kg/m2  SpO2 95%  Patient alert and oriented and in no cardiopulmonary distress.  HEENT: No facial asymmetry, EOMI,   oropharynx pink and moist.  Neck supple no JVD, no mass.  Chest: Clear to auscultation bilaterally.  CVS: S1, S2 no murmurs, no S3.Regular rate.  ABD: Soft non tender.   Ext: No edema  MS: Adequate ROM spine, shoulders, hips and knees.  Skin: Intact, no ulcerations or rash noted.  Psych: Good eye contact, tearful affect. Memory intact not anxious or depressed appearing.  CNS: CN 2-12 intact, power,  normal throughout.no  focal deficits noted.   Assessment & Plan  Osteoarthritis of both knees Uncontrolled.Toradol and depo medrol administered IM in the office , to be followed by a short course of oral prednisone and NSAIDS.   Essential hypertension Controlled, no change in medication DASH diet and commitment to daily physical activity for a minimum of 30 minutes discussed and encouraged, as a part of hypertension management. The importance of attaining a healthy weight is also discussed.  BP/Weight 07/30/2015 07/15/2015 06/03/2015 05/26/2015 05/06/2015 04/28/2015 04/16/2015  Systolic BP 108 119 128 103 120 115 140  Diastolic BP 82 86 82 70 84 73 782  Wt. (Lbs) 245 240.6 250 259 259 260 260.4  BMI 42.03 41.28 42.89 44.44 44.44 44.61 44.68  Some encounter information is confidential and restricted. Go to Review Flowsheets activity to see all data.        Hyperlipidemia LDL goal <100 Hyperlipidemia:Low fat diet discussed and encouraged.   Lipid Panel  Lab Results  Component Value Date   CHOL 170 04/16/2015   HDL 53 04/16/2015   LDLCALC 107 04/16/2015   TRIG 51 04/16/2015   CHOLHDL 3.2 04/16/2015   Controlled, no change in medication      Diabetes mellitus type 2 in obese (HCC) Controlled, no change in medication Tami Pearson is reminded of the importance of commitment to daily physical activity for 30 minutes or more, as able and the need to limit carbohydrate intake to 30 to 60 grams per meal to help with blood sugar control.   The need to  take medication as prescribed, test blood sugar as directed, and to call between visits if there is a concern that blood sugar is uncontrolled is also discussed.   Tami Pearson is reminded of the importance of daily foot exam, annual eye examination, and good blood sugar, blood pressure and cholesterol control.  Diabetic Labs Latest Ref Rng 07/30/2015 06/03/2015 04/16/2015 04/16/2014 01/30/2014  HbA1c <5.7 % 6.4(H) - 6.6(H) - 6.4(H)  Microalbumin Not estab mg/dL  - 1.1 - 1.1 -  Micro/Creat Ratio <30 mcg/mg creat - 4 - 7.9 -  Chol 125 - 200 mg/dL - - 161170 - 096(E202(H)  HDL >=45>=46 mg/dL - - 53 - 45  Calc LDL <409<130 mg/dL - - 811107 - 914(N129(H)  Triglycerides <150 mg/dL - - 51 - 829142  Creatinine 0.50 - 1.05 mg/dL 5.62(Z1.06(H) - 3.080.90 - 6.570.87   BP/Weight 07/30/2015 07/15/2015 06/03/2015 05/26/2015 05/06/2015 04/28/2015 04/16/2015  Systolic BP 108 119 128 103 120 115 140  Diastolic BP 82 86 82 70 84 73 846100  Wt. (Lbs) 245 240.6 250 259 259 260 260.4  BMI 42.03 41.28 42.89 44.44 44.44 44.61 44.68  Some encounter information is confidential and restricted. Go to Review Flowsheets activity to see all data.   Foot/eye exam completion dates 06/03/2015 04/16/2015  Foot Form Completion Done Done         Depression manged by psych, fair control, not suicidal or homicidal, great support by therapy

## 2015-08-10 NOTE — Assessment & Plan Note (Signed)
manged by psych, fair control, not suicidal or homicidal, great support by therapy

## 2015-08-10 NOTE — Assessment & Plan Note (Signed)
Controlled, no change in medication DASH diet and commitment to daily physical activity for a minimum of 30 minutes discussed and encouraged, as a part of hypertension management. The importance of attaining a healthy weight is also discussed.  BP/Weight 07/30/2015 07/15/2015 06/03/2015 05/26/2015 05/06/2015 04/28/2015 04/16/2015  Systolic BP 108 119 128 103 120 115 140  Diastolic BP 82 86 82 70 84 73 161100  Wt. (Lbs) 245 240.6 250 259 259 260 260.4  BMI 42.03 41.28 42.89 44.44 44.44 44.61 44.68  Some encounter information is confidential and restricted. Go to Review Flowsheets activity to see all data.

## 2015-08-10 NOTE — Assessment & Plan Note (Signed)
Hyperlipidemia:Low fat diet discussed and encouraged.   Lipid Panel  Lab Results  Component Value Date   CHOL 170 04/16/2015   HDL 53 04/16/2015   LDLCALC 107 04/16/2015   TRIG 51 04/16/2015   CHOLHDL 3.2 04/16/2015   Controlled, no change in medication

## 2015-08-13 ENCOUNTER — Ambulatory Visit (HOSPITAL_COMMUNITY): Payer: Self-pay | Admitting: Psychology

## 2015-08-18 ENCOUNTER — Telehealth: Payer: Self-pay | Admitting: Orthopedic Surgery

## 2015-08-18 ENCOUNTER — Other Ambulatory Visit (HOSPITAL_COMMUNITY): Payer: Self-pay | Admitting: Psychiatry

## 2015-08-18 NOTE — Telephone Encounter (Signed)
WHEN SHE CALLS ME BACK TO LET ME KNOW SHE IS SETTLED AND READY FOR SURGERY

## 2015-08-18 NOTE — Telephone Encounter (Signed)
Patient called asking when is her surgery going to be scheduled?  Please advise

## 2015-08-21 ENCOUNTER — Telehealth (HOSPITAL_COMMUNITY): Payer: Self-pay | Admitting: *Deleted

## 2015-08-21 NOTE — Telephone Encounter (Signed)
phone call from patient, said she need refill of her Xanax.

## 2015-08-22 ENCOUNTER — Ambulatory Visit (HOSPITAL_COMMUNITY): Payer: Self-pay | Admitting: Psychiatry

## 2015-08-22 ENCOUNTER — Telehealth (HOSPITAL_COMMUNITY): Payer: Self-pay | Admitting: *Deleted

## 2015-08-22 NOTE — Telephone Encounter (Signed)
No refills until appt

## 2015-08-22 NOTE — Telephone Encounter (Signed)
Pt called stating she would like refills for her Xanax. Pt had appt for 08-22-15 but pt cancelled her appt due to laying in bed all week due to back pain.. Pt also cancelled her appt for 07-25-15 due to death in family. Patient was last seen 04-25-15 and is scheduled to return for f/u on 09-12-2015. Pt number is [phone].

## 2015-08-22 NOTE — Telephone Encounter (Signed)
Message sent to provider 

## 2015-08-25 ENCOUNTER — Telehealth (HOSPITAL_COMMUNITY): Payer: Self-pay | Admitting: *Deleted

## 2015-08-25 NOTE — Telephone Encounter (Signed)
Spoke with pt and informed her with what provider stated. Pt verbalized understanding.  

## 2015-08-25 NOTE — Telephone Encounter (Signed)
Spoke with pt and she verbalized understanding. Per pt she will be at her appt on Aug 11th

## 2015-08-29 ENCOUNTER — Other Ambulatory Visit (HOSPITAL_COMMUNITY): Payer: Self-pay

## 2015-09-03 ENCOUNTER — Encounter (HOSPITAL_COMMUNITY): Admission: RE | Payer: Self-pay | Source: Ambulatory Visit

## 2015-09-03 ENCOUNTER — Inpatient Hospital Stay (HOSPITAL_COMMUNITY): Admission: RE | Admit: 2015-09-03 | Payer: Medicaid Other | Source: Ambulatory Visit | Admitting: Orthopedic Surgery

## 2015-09-03 SURGERY — ARTHROPLASTY, KNEE, TOTAL
Anesthesia: Choice | Laterality: Left

## 2015-09-04 ENCOUNTER — Other Ambulatory Visit: Payer: Self-pay

## 2015-09-04 DIAGNOSIS — M170 Bilateral primary osteoarthritis of knee: Secondary | ICD-10-CM

## 2015-09-04 MED ORDER — OXYCODONE-ACETAMINOPHEN 10-325 MG PO TABS: 1.0000 | ORAL_TABLET | ORAL | 0 refills | Status: DC | PRN

## 2015-09-12 ENCOUNTER — Ambulatory Visit (INDEPENDENT_AMBULATORY_CARE_PROVIDER_SITE_OTHER): Payer: Medicare Other | Admitting: Psychiatry

## 2015-09-12 ENCOUNTER — Encounter (HOSPITAL_COMMUNITY): Payer: Self-pay | Admitting: Psychiatry

## 2015-09-12 VITALS — BP 116/89 | HR 71 | Ht 64.0 in | Wt 232.4 lb

## 2015-09-12 DIAGNOSIS — F331 Major depressive disorder, recurrent, moderate: Secondary | ICD-10-CM

## 2015-09-12 MED ORDER — DULOXETINE HCL 60 MG PO CPEP: 60.0000 mg | ORAL_CAPSULE | Freq: Two times a day (BID) | ORAL | 2 refills | Status: DC

## 2015-09-12 MED ORDER — ALPRAZOLAM 1 MG PO TABS: 1.0000 mg | ORAL_TABLET | Freq: Every day | ORAL | 2 refills | Status: DC
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
## 2015-09-15 ENCOUNTER — Ambulatory Visit: Payer: Self-pay | Admitting: Orthopedic Surgery

## 2015-09-15 ENCOUNTER — Other Ambulatory Visit: Payer: Self-pay | Admitting: Family Medicine

## 2015-09-15 DIAGNOSIS — I10 Essential (primary) hypertension: Secondary | ICD-10-CM

## 2015-09-18 ENCOUNTER — Encounter (HOSPITAL_COMMUNITY): Payer: Self-pay | Admitting: *Deleted

## 2015-10-01 ENCOUNTER — Ambulatory Visit (HOSPITAL_COMMUNITY): Payer: Self-pay | Admitting: Psychology

## 2015-10-03 ENCOUNTER — Other Ambulatory Visit: Payer: Self-pay

## 2015-10-03 DIAGNOSIS — M170 Bilateral primary osteoarthritis of knee: Secondary | ICD-10-CM

## 2015-10-03 MED ORDER — OXYCODONE-ACETAMINOPHEN 10-325 MG PO TABS: 1.0000 | ORAL_TABLET | ORAL | 0 refills | Status: DC | PRN

## 2015-10-07 ENCOUNTER — Ambulatory Visit (HOSPITAL_COMMUNITY): Payer: Self-pay | Admitting: Psychology

## 2015-10-22 ENCOUNTER — Ambulatory Visit (INDEPENDENT_AMBULATORY_CARE_PROVIDER_SITE_OTHER): Payer: Medicare Other | Admitting: Psychology

## 2015-10-22 DIAGNOSIS — F331 Major depressive disorder, recurrent, moderate: Secondary | ICD-10-CM

## 2015-11-07 ENCOUNTER — Other Ambulatory Visit: Payer: Self-pay

## 2015-11-07 DIAGNOSIS — M170 Bilateral primary osteoarthritis of knee: Secondary | ICD-10-CM

## 2015-11-07 MED ORDER — OXYCODONE-ACETAMINOPHEN 10-325 MG PO TABS: 1.0000 | ORAL_TABLET | ORAL | 0 refills | Status: DC | PRN

## 2015-11-11 ENCOUNTER — Ambulatory Visit (INDEPENDENT_AMBULATORY_CARE_PROVIDER_SITE_OTHER): Payer: Medicare Other | Admitting: Family Medicine

## 2015-11-11 ENCOUNTER — Encounter: Payer: Self-pay | Admitting: Family Medicine

## 2015-11-11 VITALS — BP 104/74 | HR 71 | Ht 64.0 in | Wt 232.1 lb

## 2015-11-11 DIAGNOSIS — E1169 Type 2 diabetes mellitus with other specified complication: Secondary | ICD-10-CM

## 2015-11-11 DIAGNOSIS — Z23 Encounter for immunization: Secondary | ICD-10-CM

## 2015-11-11 DIAGNOSIS — Z1211 Encounter for screening for malignant neoplasm of colon: Secondary | ICD-10-CM

## 2015-11-11 DIAGNOSIS — F3289 Other specified depressive episodes: Secondary | ICD-10-CM

## 2015-11-11 DIAGNOSIS — G5601 Carpal tunnel syndrome, right upper limb: Secondary | ICD-10-CM | POA: Diagnosis not present

## 2015-11-11 DIAGNOSIS — E785 Hyperlipidemia, unspecified: Secondary | ICD-10-CM | POA: Diagnosis not present

## 2015-11-11 DIAGNOSIS — E559 Vitamin D deficiency, unspecified: Secondary | ICD-10-CM

## 2015-11-11 DIAGNOSIS — I10 Essential (primary) hypertension: Secondary | ICD-10-CM

## 2015-11-11 DIAGNOSIS — E669 Obesity, unspecified: Secondary | ICD-10-CM

## 2015-11-11 MED ORDER — PHENTERMINE HCL 37.5 MG PO TABS: 37.5000 mg | ORAL_TABLET | Freq: Every day | ORAL | 3 refills | Status: DC

## 2015-11-11 NOTE — Patient Instructions (Addendum)
Welcome to medicare in 6 to 8 weeks, with MD, call if you need me sooner  Flu vaccine today  You are referred for colonsocopy hnd to see Dr Aline Brochure  Fasting lipid, cmp and EGFr, hBA1C and TSH in 6 weeks  congrats on weight loss.  Thank you  for choosing [facility]. We consider it a privelige to serve you.  Delivering excellent health care in a caring and  compassionate way is our goal.  Partnering with you,  so that together we can achieve this goal is our strategy.   DASH Eating Plan DASH stands for "Dietary Approaches to Stop Hypertension." The DASH eating plan is a healthy eating plan that has been shown to reduce high blood pressure (hypertension). Additional health benefits may include reducing the risk of type 2 diabetes mellitus, heart disease, and stroke. The DASH eating plan may also help with weight loss. WHAT DO I NEED TO KNOW ABOUT THE DASH EATING PLAN? For the DASH eating plan, you will follow these general guidelines:  Choose foods with a percent daily value for sodium of less than 5% (as listed on the food label).  Use salt-free seasonings or herbs instead of table salt or sea salt.  Check with your health care provider or pharmacist before using salt substitutes.  Eat lower-sodium products, often labeled as "lower sodium" or "no salt added."  Eat fresh foods.  Eat more vegetables, fruits, and low-fat dairy products.  Choose whole grains. Look for the word "whole" as the first word in the ingredient list.  Choose fish and skinless chicken or [place] more often than red meat. Limit fish, poultry, and meat to 6 oz (170 g) each day.  Limit sweets, desserts, sugars, and sugary drinks.  Choose heart-healthy fats.  Limit cheese to 1 oz (28 g) per day.  Eat more home-cooked food and less restaurant, buffet, and fast food.  Limit fried foods.  Cook foods using methods other than frying.  Limit canned vegetables. If you do use them, rinse them well  to decrease the sodium.  When eating at a restaurant, ask that your food be prepared with less salt, or no salt if possible. WHAT FOODS CAN I EAT? Seek help from a dietitian for individual calorie needs. Grains Whole grain or whole wheat bread. Brown rice. Whole grain or whole wheat pasta. Quinoa, bulgur, and whole grain cereals. Low-sodium cereals. Corn or whole wheat flour tortillas. Whole grain cornbread. Whole grain crackers. Low-sodium crackers. Vegetables Fresh or frozen vegetables (raw, steamed, roasted, or grilled). Low-sodium or reduced-sodium tomato and vegetable juices. Low-sodium or reduced-sodium tomato sauce and paste. Low-sodium or reduced-sodium canned vegetables.  Fruits All fresh, canned (in natural juice), or frozen fruits. Meat and Other Protein Products Ground beef (85% or leaner), grass-fed beef, or beef trimmed of fat. Skinless chicken or [place]. Ground chicken or [place]. Pork trimmed of fat. All fish and seafood. Eggs. Dried beans, peas, or lentils. Unsalted nuts and seeds. Unsalted canned beans. Dairy Low-fat dairy products, such as skim or 1% milk, 2% or reduced-fat cheeses, low-fat ricotta or cottage cheese, or plain low-fat yogurt. Low-sodium or reduced-sodium cheeses. Fats and Oils Tub margarines without trans fats. Light or reduced-fat mayonnaise and salad dressings (reduced sodium). Avocado. Safflower, olive, or canola oils. Natural peanut or almond butter. Other Unsalted popcorn and pretzels. The items listed above may not be a complete list of recommended foods or beverages. Contact your dietitian for more options. WHAT FOODS ARE NOT RECOMMENDED? Grains White bread.  White pasta. White rice. Refined cornbread. Bagels and croissants. Crackers that contain trans fat. Vegetables Creamed or fried vegetables. Vegetables in a cheese sauce. Regular canned vegetables. Regular canned tomato sauce and paste. Regular tomato and vegetable juices. Fruits Dried fruits.  Canned fruit in light or heavy syrup. Fruit juice. Meat and Other Protein Products Fatty cuts of meat. Ribs, chicken wings, bacon, sausage, bologna, salami, chitterlings, fatback, hot dogs, bratwurst, and packaged luncheon meats. Salted nuts and seeds. Canned beans with salt. Dairy Whole or 2% milk, cream, half-and-half, and cream cheese. Whole-fat or sweetened yogurt. Full-fat cheeses or blue cheese. Nondairy creamers and whipped toppings. Processed cheese, cheese spreads, or cheese curds. Condiments Onion and garlic salt, seasoned salt, table salt, and sea salt. Canned and packaged gravies. Worcestershire sauce. Tartar sauce. Barbecue sauce. Teriyaki sauce. Soy sauce, including reduced sodium. Steak sauce. Fish sauce. Oyster sauce. Cocktail sauce. Horseradish. Ketchup and mustard. Meat flavorings and tenderizers. Bouillon cubes. Hot sauce. Tabasco sauce. Marinades. Taco seasonings. Relishes. Fats and Oils Butter, stick margarine, lard, shortening, ghee, and bacon fat. Coconut, palm kernel, or palm oils. Regular salad dressings. Other Pickles and olives. Salted popcorn and pretzels. The items listed above may not be a complete list of foods and beverages to avoid. Contact your dietitian for more information. WHERE CAN I FIND MORE INFORMATION? National Heart, Lung, and Blood Institute: travelstabloid.com   This information is not intended to replace advice given to you by your health care provider. Make sure you discuss any questions you have with your health care provider.   Document Released: 01/07/2011 Document Revised: 02/08/2014 Document Reviewed: 11/22/2012 Elsevier Interactive Patient Education [employer].

## 2015-11-15 ENCOUNTER — Other Ambulatory Visit: Payer: Self-pay | Admitting: Family Medicine

## 2015-11-16 NOTE — Assessment & Plan Note (Signed)
Controlled, no change in medication DASH diet and commitment to daily physical activity for a minimum of 30 minutes discussed and encouraged, as a part of hypertension management. The importance of attaining a healthy weight is also discussed.  BP/Weight 11/11/2015 07/30/2015 07/15/2015 06/03/2015 05/26/2015 05/06/2015 04/28/2015  Systolic BP 104 108 119 128 103 120 115  Diastolic BP 74 82 86 82 70 84 73  Wt. (Lbs) 232.08 245 240.6 250 259 259 260  BMI 39.84 42.03 41.28 42.89 44.44 44.44 44.61  Some encounter information is confidential and restricted. Go to Review Flowsheets activity to see all data.

## 2015-11-16 NOTE — Assessment & Plan Note (Signed)
Symptomatic with abnormal exam, refer to ortho for eval and management

## 2015-11-16 NOTE — Assessment & Plan Note (Signed)
Followed by psych, currently stable

## 2015-11-16 NOTE — Assessment & Plan Note (Signed)
Controlled, no change in medication Tami Pearson is reminded of the importance of commitment to daily physical activity for 30 minutes or more, as able and the need to limit carbohydrate intake to 30 to 60 grams per meal to help with blood sugar control.   The need to take medication as prescribed, test blood sugar as directed, and to call between visits if there is a concern that blood sugar is uncontrolled is also discussed.   Tami Pearson is reminded of the importance of daily foot exam, annual eye examination, and good blood sugar, blood pressure and cholesterol control. Updated lab needed at/ before next visit.  Diabetic Labs Latest Ref Rng & Units 07/30/2015 06/03/2015 04/16/2015 04/16/2014 01/30/2014  HbA1c <5.7 % 6.4(H) - 6.6(H) - 6.4(H)  Microalbumin Not estab mg/dL - 1.1 - 1.1 -  Micro/Creat Ratio <30 mcg/mg creat - 4 - 7.9 -  Chol 125 - 200 mg/dL - - 119170 - 147(W202(H)  HDL >=29>=46 mg/dL - - 53 - 45  Calc LDL <562<130 mg/dL - - 130107 - 865(H129(H)  Triglycerides <150 mg/dL - - 51 - 846142  Creatinine 0.50 - 1.05 mg/dL 9.62(X1.06(H) - 5.280.90 - 4.130.87   BP/Weight 11/11/2015 07/30/2015 07/15/2015 06/03/2015 05/26/2015 05/06/2015 04/28/2015  Systolic BP 104 108 119 128 103 120 115  Diastolic BP 74 82 86 82 70 84 73  Wt. (Lbs) 232.08 245 240.6 250 259 259 260  BMI 39.84 42.03 41.28 42.89 44.44 44.44 44.61  Some encounter information is confidential and restricted. Go to Review Flowsheets activity to see all data.   Foot/eye exam completion dates 06/03/2015 04/16/2015  Foot Form Completion Done Done

## 2015-11-16 NOTE — Assessment & Plan Note (Signed)
Hyperlipidemia:Low fat diet discussed and encouraged.   Lipid Panel  Lab Results  Component Value Date   CHOL 170 04/16/2015   HDL 53 04/16/2015   LDLCALC 107 04/16/2015   TRIG 51 04/16/2015   CHOLHDL 3.2 04/16/2015   Updated lab needed at/ before next visit.

## 2015-11-16 NOTE — Progress Notes (Signed)
Tami Pearson     MRN: [id number]      DOB: 1965/02/20   HPI [name] is here for follow up and re-evaluation of chronic medical conditions, medication management and review of any available recent lab and radiology data.  Preventive health is updated, specifically  Cancer screening and Immunization.   Questions or concerns regarding consultations or procedures which the PT has had in the interim are  addressed. The PT denies any adverse reactions to current medications since the last visit.Good response to phentermine , which she wants to continue  C/o painful swelling under armpit, aggravated by recently shaving the area C/o tingling , pain and numbness of right hand , awakens her at night and she shakes hand for relief  ROS Denies recent fever or chills. Denies sinus pressure, nasal congestion, ear pain or sore throat. Denies chest congestion, productive cough or wheezing. Denies chest pains, palpitations and leg swelling Denies abdominal pain, nausea, vomiting,diarrhea or constipation.   Denies dysuria, frequency, hesitancy or incontinence.  Denies headaches, seizures, numbness, or tingling. Denies uncontrolled  depression,  Has increased anxiety as Tami Pearson currently incarcerated, has  insomnia.  PE  BP 104/74 (BP Location: Left Arm, Patient Position: Sitting, Cuff Size: Large)   Pulse 71   Ht 5\' 4"  (1.626 m)   Wt 232 lb 1.3 oz (105.3 kg)   SpO2 98%   BMI 39.84 kg/m   Patient alert and oriented and in no cardiopulmonary distress.  HEENT: No facial asymmetry, EOMI,   oropharynx pink and moist.  Neck supple no JVD, no mass.  Chest: Clear to auscultation bilaterally.  CVS: S1, S2 no murmurs, no S3.Regular rate.  ABD: Soft non tender.   Ext: No edema  MS: Adequate ROM spine, shoulders, hips and knees.  Skin: Intact, no ulcerations or rash noted.  Psych: Good eye contact, normal affect. Memory intact not anxious or depressed appearing.  CNS: CN 2-12  intact, power,  normal throughout.no focal deficits noted.   Assessment & Plan  Essential hypertension Controlled, no change in medication DASH diet and commitment to daily physical activity for a minimum of 30 minutes discussed and encouraged, as a part of hypertension management. The importance of attaining a healthy weight is also discussed.  BP/Weight 11/11/2015 07/30/2015 07/15/2015 06/03/2015 05/26/2015 05/06/2015 04/28/2015  Systolic BP 104 108 119 128 103 120 115  Diastolic BP 74 82 86 82 70 84 73  Wt. (Lbs) 232.08 245 240.6 250 259 259 260  BMI 39.84 42.03 41.28 42.89 44.44 44.44 44.61  Some encounter information is confidential and restricted. Go to Review Flowsheets activity to see all data.       Morbid obesity Improved Patient re-educated about  the importance of commitment to a  minimum of 150 minutes of exercise per week.  The importance of healthy food choices with portion control discussed. Encouraged to start a food diary, count calories and to consider  joining a support group. Sample diet sheets offered. Goals set by the patient for the next several months.   Weight /BMI 11/11/2015 07/30/2015 07/15/2015  WEIGHT 232 lb 1.3 oz 245 lb 240 lb 9.6 oz  HEIGHT 5\' 4"  5\' 4"  5\' 4"   BMI 39.84 kg/m2 42.03 kg/m2 41.28 kg/m2  Some encounter information is confidential and restricted. Go to Review Flowsheets activity to see all data.      Diabetes mellitus type 2 in obese (HCC) Controlled, no change in medication Tami Pearson is reminded of the importance of commitment  to daily physical activity for 30 minutes or more, as able and the need to limit carbohydrate intake to 30 to 60 grams per meal to help with blood sugar control.   The need to take medication as prescribed, test blood sugar as directed, and to call between visits if there is a concern that blood sugar is uncontrolled is also discussed.   Tami Pearson is reminded of the importance of daily foot exam, annual eye  examination, and good blood sugar, blood pressure and cholesterol control. Updated lab needed at/ before next visit.  Diabetic Labs Latest Ref Rng & Units 07/30/2015 06/03/2015 04/16/2015 04/16/2014 01/30/2014  HbA1c <5.7 % 6.4(H) - 6.6(H) - 6.4(H)  Microalbumin Not estab mg/dL - 1.1 - 1.1 -  Micro/Creat Ratio <30 mcg/mg creat - 4 - 7.9 -  Chol 125 - 200 mg/dL - - 161170 - 096(E202(H)  HDL >=45>=46 mg/dL - - 53 - 45  Calc LDL <409<130 mg/dL - - 811107 - 914(N129(H)  Triglycerides <150 mg/dL - - 51 - 829142  Creatinine 0.50 - 1.05 mg/dL 5.62(Z1.06(H) - 3.080.90 - 6.570.87   BP/Weight 11/11/2015 07/30/2015 07/15/2015 06/03/2015 05/26/2015 05/06/2015 04/28/2015  Systolic BP 104 108 119 128 103 120 115  Diastolic BP 74 82 86 82 70 84 73  Wt. (Lbs) 232.08 245 240.6 250 259 259 260  BMI 39.84 42.03 41.28 42.89 44.44 44.44 44.61  Some encounter information is confidential and restricted. Go to Review Flowsheets activity to see all data.   Foot/eye exam completion dates 06/03/2015 04/16/2015  Foot Form Completion Done Done        Carpal tunnel syndrome, right Symptomatic with abnormal exam, refer to ortho for eval and management  Hyperlipidemia LDL goal <100 Hyperlipidemia:Low fat diet discussed and encouraged.   Lipid Panel  Lab Results  Component Value Date   CHOL 170 04/16/2015   HDL 53 04/16/2015   LDLCALC 107 04/16/2015   TRIG 51 04/16/2015   CHOLHDL 3.2 04/16/2015   Updated lab needed at/ before next visit.    Depression Followed by psych, currently stable

## 2015-11-16 NOTE — Assessment & Plan Note (Signed)
Improved Patient re-educated about  the importance of commitment to a  minimum of 150 minutes of exercise per week.  The importance of healthy food choices with portion control discussed. Encouraged to start a food diary, count calories and to consider  joining a support group. Sample diet sheets offered. Goals set by the patient for the next several months.   Weight /BMI 11/11/2015 07/30/2015 07/15/2015  WEIGHT 232 lb 1.3 oz 245 lb 240 lb 9.6 oz  HEIGHT 5\' 4"  5\' 4"  5\' 4"   BMI 39.84 kg/m2 42.03 kg/m2 41.28 kg/m2  Some encounter information is confidential and restricted. Go to Review Flowsheets activity to see all data.

## 2015-11-18 ENCOUNTER — Ambulatory Visit: Payer: Self-pay | Admitting: Orthopedic Surgery

## 2015-11-21 ENCOUNTER — Ambulatory Visit (HOSPITAL_COMMUNITY): Payer: Self-pay | Admitting: Psychology

## 2015-11-24 ENCOUNTER — Ambulatory Visit (INDEPENDENT_AMBULATORY_CARE_PROVIDER_SITE_OTHER): Payer: Medicare Other | Admitting: Orthopedic Surgery

## 2015-11-24 ENCOUNTER — Encounter: Payer: Self-pay | Admitting: Orthopedic Surgery

## 2015-11-24 VITALS — BP 118/73 | HR 96 | Wt 221.0 lb

## 2015-11-24 DIAGNOSIS — G5601 Carpal tunnel syndrome, right upper limb: Secondary | ICD-10-CM | POA: Diagnosis not present
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
## 2015-11-24 NOTE — Patient Instructions (Addendum)
Carpal Tunnel Release  Carpal tunnel release is a surgical procedure to relieve numbness and pain in your hand that are caused by carpal tunnel syndrome. Your carpal tunnel is a narrow, hollow space in your wrist. It passes between your wrist bones and a band of connective tissue (transverse carpal ligament). The nerve that supplies most of your hand (median nerve) passes through this space, and so do the connections between your fingers and the muscles of your arm (tendons). Carpal tunnel syndrome makes this space swell and become narrow, and this causes pain and numbness.  In carpal tunnel release surgery, a surgeon cuts through the transverse carpal ligament to make more room in the carpal tunnel space. You may have this surgery if other types of treatment have not worked.  LET YOUR HEALTH CARE PROVIDER KNOW ABOUT:  · Any allergies you have.  · All medicines you are taking, including vitamins, herbs, eye drops, creams, and over-the-counter medicines.  · Previous problems you or members of your family have had with the use of anesthetics.  · Any blood disorders you have.  · Previous surgeries you have had.  · Medical conditions you have.  RISKS AND COMPLICATIONS  Generally, this is a safe procedure. However, problems may occur, including:  · Bleeding.  · Infection.  · Injury to the median nerve.  · Need for additional surgery.  BEFORE THE PROCEDURE  · Ask your health care provider about:    Changing or stopping your regular medicines. This is especially important if you are taking diabetes medicines or blood thinners.    Taking medicines such as aspirin and ibuprofen. These medicines can thin your blood. Do not take these medicines before your procedure if your health care provider instructs you not to.  · Do not eat or drink anything after midnight on the night before the procedure or as directed by your health care provider.  · Plan to have someone take you home after the procedure.  PROCEDURE  · An IV tube may  be inserted into a vein.  · You will be given one of the following:    A medicine that numbs the wrist area (local anesthetic). You may also be given a medicine to make you relax (sedative).    A medicine that makes you go to sleep (general anesthetic).  · Your arm, hand, and wrist will be cleaned with a germ-killing solution (antiseptic).  · Your surgeon will make a surgical cut (incision) over the palm side of your wrist. The surgeon will pull aside the skin of your wrist to expose the carpal tunnel space.  · The surgeon will cut the transverse carpal ligament.  · The edges of the incision will be closed with stitches (sutures) or staples.  · A bandage (dressing) will be placed over your wrist and wrapped around your hand and wrist.  AFTER THE PROCEDURE  · You may spend some time in a recovery area.  · Your blood pressure, heart rate, breathing rate, and blood oxygen level will be monitored often until the medicines you were given have worn off.  · You will likely have some pain. You will be given pain medicine.  · You may need to wear a splint or a wrist brace over your dressing.     This information is not intended to replace advice given to you by your health care provider. Make sure you discuss any questions you have with your health care provider.     Document Released:   04/10/2003 Document Revised: 02/08/2014 Document Reviewed: 09/05/2013  [employer] Interactive Patient Education ©2016 [employer]

## 2015-11-25 ENCOUNTER — Ambulatory Visit (INDEPENDENT_AMBULATORY_CARE_PROVIDER_SITE_OTHER): Payer: Medicare Other | Admitting: Psychology

## 2015-11-25 DIAGNOSIS — F331 Major depressive disorder, recurrent, moderate: Secondary | ICD-10-CM

## 2015-12-02 ENCOUNTER — Other Ambulatory Visit: Payer: Self-pay | Admitting: Family Medicine

## 2015-12-03 NOTE — Patient Instructions (Signed)
Tami Pearson  12/03/2015     @PREFPERIOPPHARMACY @   Your procedure is scheduled on  12/10/2015   [facility]  [facility].  Call this number if you have problems the morning of surgery:  [phone]   Remember:  Do not eat food or drink liquids after midnight.  Take these medicines the morning of surgery with A SIP OF WATER xanax, cymbalta, neurontin, atarax, lisinopril, oxycodone. DO NOT take any medications for diabetes the morning of surgery.   Do not wear jewelry, make-up or nail polish.  Do not wear lotions, powders, or perfumes, or deoderant.  Do not shave 48 hours prior to surgery.  Men may shave face and neck.  Do not bring valuables to the hospital.  [facility] any belongings or valuables.  Contacts, dentures or bridgework may not be worn into surgery.  Leave your suitcase in the car.  After surgery it may be brought to your room.  For patients admitted to the hospital, discharge time will be determined by your treatment team.  Patients discharged the day of surgery will not be allowed to drive home.   Name and phone number of your driver:   family Special instructions:  none  Please read over the following fact sheets that you were given. Anesthesia Post-op Instructions and Care and Recovery After Surgery       Carpal Tunnel Release Carpal tunnel release is a surgical procedure to relieve numbness and pain in your hand that are caused by carpal tunnel syndrome. Your carpal tunnel is a narrow, hollow space in your wrist. It passes between your wrist bones and a band of connective tissue (transverse carpal ligament). The nerve that supplies most of your hand (median nerve) passes through this space, and so do the connections between your fingers and the muscles of your arm (tendons). Carpal tunnel syndrome makes this space swell and become narrow, and this causes pain and numbness. In carpal tunnel release surgery, a  surgeon cuts through the transverse carpal ligament to make more room in the carpal tunnel space. You may have this surgery if other types of treatment have not worked. LET [facility] KNOW ABOUT:  Any allergies you have.  All medicines you are taking, including vitamins, herbs, eye drops, creams, and over-the-counter medicines.  Previous problems you or members of your family have had with the use of anesthetics.  Any blood disorders you have.  Previous surgeries you have had.  Medical conditions you have. RISKS AND COMPLICATIONS Generally, this is a safe procedure. However, problems may occur, including:  Bleeding.  Infection.  Injury to the median nerve.  Need for additional surgery. BEFORE THE PROCEDURE  Ask your health care provider about:  Changing or stopping your regular medicines. This is especially important if you are taking diabetes medicines or blood thinners.  Taking medicines such as aspirin and ibuprofen. These medicines can thin your blood. Do not take these medicines before your procedure if your health care provider instructs you not to.  Do not eat or drink anything after midnight on the night before the procedure or as directed by your health care provider.  Plan to have someone take you home after the procedure. PROCEDURE  An IV tube may be inserted into a vein.  You will be given one of the following:  A medicine that numbs the wrist area (local anesthetic). You may also  be given a medicine to make you relax (sedative).  A medicine that makes you go to sleep (general anesthetic).  Your arm, hand, and wrist will be cleaned with a germ-killing solution (antiseptic).  Your surgeon will make a surgical cut (incision) over the palm side of your wrist. The surgeon will pull aside the skin of your wrist to expose the carpal tunnel space.  The surgeon will cut the transverse carpal ligament.  The edges of the incision will be closed with  stitches (sutures) or staples.  A bandage (dressing) will be placed over your wrist and wrapped around your hand and wrist. AFTER THE PROCEDURE  You may spend some time in a recovery area.  Your blood pressure, heart rate, breathing rate, and blood oxygen level will be monitored often until the medicines you were given have worn off.  You will likely have some pain. You will be given pain medicine.  You may need to wear a splint or a wrist brace over your dressing.   This information is not intended to replace advice given to you by your health care provider. Make sure you discuss any questions you have with your health care provider.   Document Released: 04/10/2003 Document Revised: 02/08/2014 Document Reviewed: 09/05/2013 Elsevier Interactive Patient Education 2016 Else[employer]. Carpal Tunnel Release, Care After Refer to this sheet in the next few weeks. These instructions provide you with information about caring for yourself after your procedure. Your health care provider may also give you more specific instructions. Your treatment has been planned according to current medical practices, but problems sometimes occur. Call your health care provider if you have any problems or questions after your procedure. WHAT TO EXPECT AFTER THE PROCEDURE After your procedure, it is typical to have the following:  Pain.  Numbness.  Tingling.  Swelling.  Stiffness.  Bruising. HOME CARE INSTRUCTIONS  Take medicines only as directed by your health care provider.  There are many different ways to close and cover an incision, including stitches (sutures), skin glue, and adhesive strips. Follow your health care provider's instructions about:  Incision care.  Bandage (dressing) changes and removal.  Incision closure removal.  Wear a splint or a brace as directed by your surgeon. You may need to do this for 2-3 weeks.  Keep your hand raised (elevated) above the level of your heart while  you are resting. Move your fingers often.  Avoid activities that cause hand pain.  Ask your surgeon when you can start to do all of your usual activities again, such as:  Driving.  Returning to work.  Bathing and swimming.  Keep all follow-up visits as directed by your health care provider. This is important. You may need physical therapy for several months to speed healing and regain movement. SEEK MEDICAL CARE IF:  You have drainage, redness, swelling, or pain at your incision site.  You have a fever.  You have chills.  Your pain medicine is not working.  Your symptoms do not go away after 2 months.  Your symptoms go away and then return. SEEK IMMEDIATE MEDICAL CARE IF:  You have pain or numbness that is getting worse.  Your fingers change color.  You are not able to move your fingers.   This information is not intended to replace advice given to you by your health care provider. Make sure you discuss any questions you have with your health care provider.   Document Released: 08/07/2004 Document Revised: 02/08/2014 Document Reviewed: 09/05/2013 Elsevier Interactive  Patient Education 2016 Else[employer]. PATIENT INSTRUCTIONS POST-ANESTHESIA  IMMEDIATELY FOLLOWING SURGERY:  Do not drive or operate machinery for the first twenty four hours after surgery.  Do not make any important decisions for twenty four hours after surgery or while taking narcotic pain medications or sedatives.  If you develop intractable nausea and vomiting or a severe headache please notify your doctor immediately.  FOLLOW-UP:  Please make an appointment with your surgeon as instructed. You do not need to follow up with anesthesia unless specifically instructed to do so.  WOUND CARE INSTRUCTIONS (if applicable):  Keep a dry clean dressing on the anesthesia/puncture wound site if there is drainage.  Once the wound has quit draining you may leave it open to air.  Generally you should leave the bandage  intact for twenty four hours unless there is drainage.  If the epidural site drains for more than 36-48 hours please call the anesthesia department.  QUESTIONS?:  Please feel free to call your physician or the hospital operator if you have any questions, and they will be happy to assist you.

## 2015-12-05 ENCOUNTER — Other Ambulatory Visit: Payer: Self-pay

## 2015-12-05 ENCOUNTER — Encounter (HOSPITAL_COMMUNITY)
Admission: RE | Admit: 2015-12-05 | Discharge: 2015-12-05 | Disposition: A | Discharge status: Home / Self Care | Payer: Medicare Other | Source: Ambulatory Visit | Attending: Orthopedic Surgery | Admitting: Orthopedic Surgery

## 2015-12-05 ENCOUNTER — Encounter (HOSPITAL_COMMUNITY): Payer: Self-pay

## 2015-12-05 DIAGNOSIS — E119 Type 2 diabetes mellitus without complications: Secondary | ICD-10-CM | POA: Diagnosis not present

## 2015-12-05 DIAGNOSIS — I10 Essential (primary) hypertension: Secondary | ICD-10-CM | POA: Diagnosis not present

## 2015-12-05 DIAGNOSIS — M170 Bilateral primary osteoarthritis of knee: Secondary | ICD-10-CM | POA: Insufficient documentation

## 2015-12-05 DIAGNOSIS — Z01810 Encounter for preprocedural cardiovascular examination: Secondary | ICD-10-CM | POA: Insufficient documentation

## 2015-12-05 DIAGNOSIS — E785 Hyperlipidemia, unspecified: Secondary | ICD-10-CM | POA: Diagnosis not present

## 2015-12-05 DIAGNOSIS — Z01812 Encounter for preprocedural laboratory examination: Secondary | ICD-10-CM | POA: Diagnosis not present

## 2015-12-05 DIAGNOSIS — M169 Osteoarthritis of hip, unspecified: Secondary | ICD-10-CM | POA: Diagnosis not present

## 2015-12-05 DIAGNOSIS — Z6837 Body mass index (BMI) 37.0-37.9, adult: Secondary | ICD-10-CM | POA: Insufficient documentation

## 2015-12-05 DIAGNOSIS — G5601 Carpal tunnel syndrome, right upper limb: Secondary | ICD-10-CM | POA: Diagnosis not present

## 2015-12-05 DIAGNOSIS — E8881 Metabolic syndrome: Secondary | ICD-10-CM | POA: Diagnosis not present

## 2015-12-05 HISTORY — DX: Pure hypercholesterolemia, unspecified: E78.00

## 2015-12-05 LAB — CBC WITH DIFFERENTIAL/PLATELET
BASOS ABS: 0.1 10*3/uL (ref 0.0–0.1)
BASOS PCT: 1 %
EOS PCT: 2 %
Eosinophils Absolute: 0.1 10*3/uL (ref 0.0–0.7)
HCT: 37.1 % (ref 36.0–46.0)
Hemoglobin: 12.1 g/dL (ref 12.0–15.0)
LYMPHS PCT: 41 %
Lymphs Abs: 2.5 10*3/uL (ref 0.7–4.0)
MCH: 29.3 pg (ref 26.0–34.0)
MCHC: 32.6 g/dL (ref 30.0–36.0)
MCV: 89.8 fL (ref 78.0–100.0)
MONO ABS: 0.5 10*3/uL (ref 0.1–1.0)
Monocytes Relative: 8 %
Neutro Abs: 2.9 10*3/uL (ref 1.7–7.7)
Neutrophils Relative %: 48 %
PLATELETS: 283 10*3/uL (ref 150–400)
RBC: 4.13 MIL/uL (ref 3.87–5.11)
RDW: 14 % (ref 11.5–15.5)
WBC: 6.1 10*3/uL (ref 4.0–10.5)

## 2015-12-05 LAB — BASIC METABOLIC PANEL
Anion gap: 8 (ref 5–15)
BUN: 23 mg/dL — AB (ref 6–20)
CO2: 22 mmol/L (ref 22–32)
CREATININE: 1.01 mg/dL — AB (ref 0.44–1.00)
Calcium: 9.2 mg/dL (ref 8.9–10.3)
Chloride: 108 mmol/L (ref 101–111)
GFR calc Af Amer: >60 mL/min (ref >60)
GFR calc non Af Amer: >60 mL/min (ref >60)
Glucose, Bld: 95 mg/dL (ref 65–99)
Potassium: 3.8 mmol/L (ref 3.5–5.1)
SODIUM: 138 mmol/L (ref 135–145)

## 2015-12-05 MED ORDER — OXYCODONE-ACETAMINOPHEN 10-325 MG PO TABS: 1.0000 | ORAL_TABLET | ORAL | 0 refills | Status: DC | PRN

## 2015-12-05 MED ORDER — METFORMIN HCL 500 MG PO TABS: ORAL_TABLET | ORAL | 0 refills | Status: DC
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
## 2015-12-10 ENCOUNTER — Ambulatory Visit (HOSPITAL_COMMUNITY)
Admission: RE | Admit: 2015-12-10 | Discharge: 2015-12-10 | Disposition: A | Discharge status: Home / Self Care | Payer: Medicare Other | Source: Ambulatory Visit | Attending: Orthopedic Surgery | Admitting: Orthopedic Surgery

## 2015-12-10 ENCOUNTER — Ambulatory Visit (HOSPITAL_COMMUNITY): Payer: Medicare Other | Admitting: Anesthesiology

## 2015-12-10 ENCOUNTER — Encounter (HOSPITAL_COMMUNITY): Payer: Self-pay | Admitting: *Deleted

## 2015-12-10 ENCOUNTER — Encounter (HOSPITAL_COMMUNITY)
Admission: RE | Disposition: A | Discharge status: Home / Self Care | Payer: Self-pay | Source: Ambulatory Visit | Attending: Orthopedic Surgery

## 2015-12-10 DIAGNOSIS — Z79899 Other long term (current) drug therapy: Secondary | ICD-10-CM | POA: Diagnosis not present

## 2015-12-10 DIAGNOSIS — F419 Anxiety disorder, unspecified: Secondary | ICD-10-CM | POA: Diagnosis not present

## 2015-12-10 DIAGNOSIS — G5601 Carpal tunnel syndrome, right upper limb: Secondary | ICD-10-CM | POA: Diagnosis not present

## 2015-12-10 DIAGNOSIS — M1990 Unspecified osteoarthritis, unspecified site: Secondary | ICD-10-CM | POA: Insufficient documentation

## 2015-12-10 DIAGNOSIS — M170 Bilateral primary osteoarthritis of knee: Secondary | ICD-10-CM

## 2015-12-10 DIAGNOSIS — E119 Type 2 diabetes mellitus without complications: Secondary | ICD-10-CM | POA: Insufficient documentation

## 2015-12-10 DIAGNOSIS — Z7984 Long term (current) use of oral hypoglycemic drugs: Secondary | ICD-10-CM | POA: Insufficient documentation

## 2015-12-10 DIAGNOSIS — F329 Major depressive disorder, single episode, unspecified: Secondary | ICD-10-CM | POA: Diagnosis not present

## 2015-12-10 DIAGNOSIS — I10 Essential (primary) hypertension: Secondary | ICD-10-CM | POA: Diagnosis not present

## 2015-12-10 HISTORY — PX: CARPAL TUNNEL RELEASE: SHX101

## 2015-12-10 LAB — GLUCOSE, CAPILLARY
GLUCOSE-CAPILLARY: 102 mg/dL — AB (ref 65–99)
Glucose-Capillary: 94 mg/dL (ref 65–99)

## 2015-12-10 SURGERY — CARPAL TUNNEL RELEASE
Anesthesia: Regional | Site: Hand | Laterality: Right

## 2015-12-10 MED ORDER — CHLORHEXIDINE GLUCONATE 4 % EX LIQD: 60.0000 mL | Freq: Once | CUTANEOUS | Status: DC

## 2015-12-10 MED ORDER — 0.9 % SODIUM CHLORIDE (POUR BTL) OPTIME
TOPICAL | Status: DC | PRN
  Administered 2015-12-10: 1000 mL

## 2015-12-10 MED ORDER — MIDAZOLAM HCL 2 MG/2ML IJ SOLN
1.0000 mg | INTRAMUSCULAR | Status: DC | PRN
  Administered 2015-12-10: 2 mg via INTRAVENOUS

## 2015-12-10 MED ORDER — OXYCODONE-ACETAMINOPHEN 5-325 MG PO TABS
1.0000 | ORAL_TABLET | Freq: Once | ORAL | Status: AC
  Administered 2015-12-10: 1 via ORAL
  Filled 2015-12-10: qty 1

## 2015-12-10 MED ORDER — MIDAZOLAM HCL 2 MG/2ML IJ SOLN
INTRAMUSCULAR | Status: AC
  Filled 2015-12-10: qty 2

## 2015-12-10 MED ORDER — LIDOCAINE HCL (PF) 0.5 % IJ SOLN
INTRAMUSCULAR | Status: AC
  Filled 2015-12-10: qty 50

## 2015-12-10 MED ORDER — PROPOFOL 500 MG/50ML IV EMUL
INTRAVENOUS | Status: DC | PRN
  Administered 2015-12-10: 2 ug/kg/min via INTRAVENOUS
  Administered 2015-12-10: 13:00:00 via INTRAVENOUS

## 2015-12-10 MED ORDER — FENTANYL CITRATE (PF) 100 MCG/2ML IJ SOLN
INTRAMUSCULAR | Status: DC | PRN
  Administered 2015-12-10 (×2): 50 ug via INTRAVENOUS

## 2015-12-10 MED ORDER — FENTANYL CITRATE (PF) 100 MCG/2ML IJ SOLN
25.0000 ug | INTRAMUSCULAR | Status: AC | PRN
  Administered 2015-12-10 (×2): 25 ug via INTRAVENOUS

## 2015-12-10 MED ORDER — BUPIVACAINE HCL (PF) 0.5 % IJ SOLN
INTRAMUSCULAR | Status: AC
  Filled 2015-12-10: qty 30

## 2015-12-10 MED ORDER — FENTANYL CITRATE (PF) 100 MCG/2ML IJ SOLN
INTRAMUSCULAR | Status: AC
  Filled 2015-12-10: qty 2

## 2015-12-10 MED ORDER — BUPIVACAINE HCL (PF) 0.5 % IJ SOLN
INTRAMUSCULAR | Status: DC | PRN
  Administered 2015-12-10: 10 mL

## 2015-12-10 MED ORDER — FENTANYL CITRATE (PF) 100 MCG/2ML IJ SOLN: 25.0000 ug | INTRAMUSCULAR | Status: DC | PRN

## 2015-12-10 MED ORDER — CEFAZOLIN SODIUM-DEXTROSE 2-4 GM/100ML-% IV SOLN
2.0000 g | INTRAVENOUS | Status: AC
  Administered 2015-12-10 (×2): 2 g via INTRAVENOUS
  Filled 2015-12-10: qty 100

## 2015-12-10 MED ORDER — LACTATED RINGERS IV SOLN
INTRAVENOUS | Status: DC
  Administered 2015-12-10: 1000 mL via INTRAVENOUS

## 2015-12-10 SURGICAL SUPPLY — 41 items
BAG HAMPER (MISCELLANEOUS) ×2 IMPLANT
BANDAGE ELASTIC 3 LF NS (GAUZE/BANDAGES/DRESSINGS) ×2 IMPLANT
BANDAGE ESMARK 4X12 BL STRL LF (DISPOSABLE) ×1 IMPLANT
BLADE SURG 15 STRL LF DISP TIS (BLADE) ×1 IMPLANT
BLADE SURG 15 STRL SS (BLADE) ×2
BNDG CMPR 12X4 ELC STRL LF (DISPOSABLE) ×1
BNDG CMPR MED 5X3 ELC HKLP NS (GAUZE/BANDAGES/DRESSINGS) ×1
BNDG COHESIVE 4X5 TAN STRL (GAUZE/BANDAGES/DRESSINGS) ×2 IMPLANT
BNDG ESMARK 4X12 BLUE STRL LF (DISPOSABLE) ×2
BNDG GAUZE ELAST 4 BULKY (GAUZE/BANDAGES/DRESSINGS) ×1 IMPLANT
CHLORAPREP W/TINT 26ML (MISCELLANEOUS) ×2 IMPLANT
CLOTH BEACON ORANGE TIMEOUT ST (SAFETY) ×2 IMPLANT
COVER LIGHT HANDLE STERIS (MISCELLANEOUS) ×4 IMPLANT
CUFF TOURNIQUET SINGLE 24IN (TOURNIQUET CUFF) ×1 IMPLANT
DECANTER SPIKE VIAL GLASS SM (MISCELLANEOUS) ×2 IMPLANT
DRAPE PROXIMA HALF (DRAPES) ×2 IMPLANT
DRSG XEROFORM 1X8 (GAUZE/BANDAGES/DRESSINGS) ×1 IMPLANT
ELECT NDL TIP 2.8 STRL (NEEDLE) ×1 IMPLANT
ELECT NEEDLE TIP 2.8 STRL (NEEDLE) ×2 IMPLANT
ELECT REM PT RETURN 9FT ADLT (ELECTROSURGICAL) ×2
ELECTRODE REM PT RTRN 9FT ADLT (ELECTROSURGICAL) ×1 IMPLANT
GAUZE SPONGE 4X4 12PLY STRL (GAUZE/BANDAGES/DRESSINGS) ×1 IMPLANT
GLOVE BIOGEL PI IND STRL 7.0 (GLOVE) ×1 IMPLANT
GLOVE BIOGEL PI INDICATOR 7.0 (GLOVE) ×1
GLOVE SKINSENSE NS SZ8.0 LF (GLOVE) ×1
GLOVE SKINSENSE STRL SZ8.0 LF (GLOVE) ×1 IMPLANT
GLOVE SS N UNI LF 8.5 STRL (GLOVE) ×2 IMPLANT
GOWN STRL REUS W/ TWL LRG LVL3 (GOWN DISPOSABLE) ×1 IMPLANT
GOWN STRL REUS W/TWL LRG LVL3 (GOWN DISPOSABLE) ×2
GOWN STRL REUS W/TWL XL LVL3 (GOWN DISPOSABLE) ×2 IMPLANT
HAND ALUMI XLG (SOFTGOODS) ×2 IMPLANT
KIT ROOM TURNOVER APOR (KITS) ×2 IMPLANT
MANIFOLD NEPTUNE II (INSTRUMENTS) ×2 IMPLANT
NDL HYPO 21X1.5 SAFETY (NEEDLE) ×1 IMPLANT
NEEDLE HYPO 21X1.5 SAFETY (NEEDLE) ×2 IMPLANT
NS IRRIG 1000ML POUR BTL (IV SOLUTION) ×2 IMPLANT
PACK BASIC LIMB (CUSTOM PROCEDURE TRAY) ×2 IMPLANT
PAD ARMBOARD 7.5X6 YLW CONV (MISCELLANEOUS) ×2 IMPLANT
SET BASIN LINEN APH (SET/KITS/TRAYS/PACK) ×2 IMPLANT
SUT ETHILON 3 0 FSL (SUTURE) ×2 IMPLANT
SYR CONTROL 10ML LL (SYRINGE) ×2 IMPLANT

## 2015-12-10 NOTE — Anesthesia Preprocedure Evaluation (Signed)
Anesthesia Evaluation  Patient identified by MRN, date of birth, ID band Patient awake    Reviewed: Allergy & Precautions, NPO status , Patient's Chart, lab work & pertinent test results  Airway Mallampati: II  TM Distance: >3 FB     Dental  (+) Teeth Intact   Pulmonary neg pulmonary ROS,    breath sounds clear to auscultation       Cardiovascular hypertension, Pt. on medications  Rhythm:Regular Rate:Normal     Neuro/Psych  Headaches, PSYCHIATRIC DISORDERS Anxiety Depression    GI/Hepatic negative GI ROS,   Endo/Other  diabetes, Type 2, Oral Hypoglycemic AgentsMorbid obesity  Renal/GU      Musculoskeletal  (+) Arthritis , Osteoarthritis,    Abdominal   Peds  Hematology   Anesthesia Other Findings   Reproductive/Obstetrics                             Anesthesia Physical Anesthesia Plan  ASA: II  Anesthesia Plan: Bier Block   Post-op Pain Management:    Induction: Intravenous  Airway Management Planned: Simple Face Mask  Additional Equipment:   Intra-op Plan:   Post-operative Plan:   Informed Consent: I have reviewed the patients History and Physical, chart, labs and discussed the procedure including the risks, benefits and alternatives for the proposed anesthesia with the patient or authorized representative who has indicated his/her understanding and acceptance.     Plan Discussed with:   Anesthesia Plan Comments:         Anesthesia Quick Evaluation

## 2015-12-10 NOTE — Interval H&P Note (Signed)
History and Physical Interval Note:  12/10/2015 12:38 PM  BP (!) 121/101   Temp 97.7 F (36.5 C) (Oral)   Resp (!) 23   LMP 03/18/2013   SpO2 100%   Tami Pearson  has presented today for surgery, with the diagnosis of right carpal tunnel syndrome  The various methods of treatment have been discussed with the patient and family. After consideration of risks, benefits and other options for treatment, the patient has consented to  Procedure(s): CARPAL TUNNEL RELEASE (Right) as a surgical intervention .  The patient's history has been reviewed, patient examined, no change in status, stable for surgery.  I have reviewed the patient's chart and labs.  Questions were answered to the patient's satisfaction.     Fuller CanadaStanley Laterrian Hevener

## 2015-12-10 NOTE — Anesthesia Procedure Notes (Signed)
Anesthesia Regional Block:  Bier block (IV Regional)  Pre-Anesthetic Checklist: ,, timeout performed, Correct Patient, Correct Site, Correct Laterality, Correct Procedure,, site marked, surgical consent,, at surgeon's request  Laterality: Right     Needles:  Injection technique: Single-shot  Needle Type: Other      Needle Gauge: 22 and 22 G    Additional Needles: Bier block (IV Regional)  Nerve Stimulator or Paresthesia:   Additional Responses:  Pulse checked post tourniquet inflation. IV NSL discontinued post injection. Narrative:  Start time: 12/10/2015 12:58 PM  Performed by: Personally

## 2015-12-10 NOTE — Anesthesia Procedure Notes (Signed)
Procedure Name: MAC Date/Time: 12/10/2015 12:42 PM Performed by: Pernell DupreADAMS, Magan Winnett A Pre-anesthesia Checklist: Patient identified, Timeout performed, Emergency Drugs available, Suction available and Patient being monitored Oxygen Delivery Method: Simple face mask

## 2015-12-10 NOTE — Anesthesia Postprocedure Evaluation (Signed)
Anesthesia Post Note  Patient: Rica RecordsAngeline L Hopke  Procedure(s) Performed: Procedure(s) (LRB): CARPAL TUNNEL RELEASE (Right)  Patient location during evaluation: PACU Anesthesia Type: Bier Block and MAC Level of consciousness: awake and alert and oriented Pain management: pain level controlled Vital Signs Assessment: post-procedure vital signs reviewed and stable Respiratory status: spontaneous breathing Cardiovascular status: stable Postop Assessment: no signs of nausea or vomiting Anesthetic complications: no    Last Vitals:  Vitals:   12/10/15 1215 12/10/15 1230  BP: (!) 121/101 107/72  Resp: (!) 23 19  Temp:      Last Pain:  Vitals:   12/10/15 1127  TempSrc: Oral                 ADAMS, AMY A

## 2015-12-10 NOTE — Transfer of Care (Signed)
Immediate Anesthesia Transfer of Care Note  Patient: Tami Pearson  Procedure(s) Performed: Procedure(s): CARPAL TUNNEL RELEASE (Right)  Patient Location: PACU  Anesthesia Type:MAC and Bier block  Level of Consciousness: awake, alert , oriented and patient cooperative  Airway & Oxygen Therapy: Patient Spontanous Breathing and Patient connected to nasal cannula oxygen  Post-op Assessment: Report given to RN and Post -op Vital signs reviewed and stable  Post vital signs: Reviewed and stable  Last Vitals:  Vitals:   12/10/15 1215 12/10/15 1230  BP: (!) 121/101 107/72  Resp: (!) 23 19  Temp:      Last Pain:  Vitals:   12/10/15 1127  TempSrc: Oral      Patients Stated Pain Goal: 8 (12/10/15 1127)  Complications: No apparent anesthesia complications

## 2015-12-10 NOTE — Op Note (Signed)
12/10/2015  1:25 PM  PATIENT:  Tami Pearson  50 y.o. female  PRE-OPERATIVE DIAGNOSIS:  right carpal tunnel syndrome  POST-OPERATIVE DIAGNOSIS:  right carpal tunnel syndrome  PROCEDURE:  Procedure(s): CARPAL TUNNEL RELEASE (Right)  SURGEON:  Surgeon(s) and Role:    * Krislyn Donnan E Maudell Stanbrough, MD - Primary  PHYSICIAN ASSISTANT:   ASSISTANTS: none   ANESTHESIA:   regional  EBL:  Total I/O In: 100 [I.V.:100] Out: 0   BLOOD ADMINISTERED:none  DRAINS: none   LOCAL MEDICATIONS USED:  MARCAINE     SPECIMEN:  No Specimen  DISPOSITION OF SPECIMEN:  N/A  COUNTS:  YES  TOURNIQUET:  * Missing tourniquet times found for documented tourniquets in log:  [id number] *  DICTATION: .Dragon Dictation  PLAN OF CARE: Discharge to home after PACU  PATIENT DISPOSITION:  PACU - hemodynamically stable.   Delay start of Pharmacological VTE agent (>24hrs) due to surgical blood loss or risk of bleeding: not applicable  Carpal tunnel release right wrist  Operative findings compression of the right median nerveoccupied lesions  Indications failure of conservative treatment to relieve pain and paresthesias and numbness and tingling of the right hand.  The patient was identified in the preop area we confirm the surgical site marked as right wrist. Chart update completed. Patient taken to surgery. She had 2 g of Ancef. After establishing a Bier block her arm was prepped with ChloraPrep.  Timeout executed completed and confirmed site.  A straight incision was made over the carpal tunnel in line with the radial border of the ring finger. Blunt dissection was carried out to find the distal aspect of the carpal tunnel. A blunted judgment was passed beneath the carpal tunnel. Sharp incision was then used to release the transverse carpal ligament. The contents of the carpal tunnel were inspected. The median nerve was compressed with slight discoloration.  The wound was irrigated and then closed with  3-0 nylon suture. We injected 10 mL of plain Marcaine on the radial side of the incision  A sterile bandage was applied and the tourniquet was released the color of the hand and capillary refill were normal  The patient was taken to the recovery room in stable condition  [id number] 

## 2015-12-10 NOTE — Brief Op Note (Signed)
12/10/2015  1:25 PM  PATIENT:  Tami Pearson  50 y.o. female  PRE-OPERATIVE DIAGNOSIS:  right carpal tunnel syndrome  POST-OPERATIVE DIAGNOSIS:  right carpal tunnel syndrome  PROCEDURE:  Procedure(s): CARPAL TUNNEL RELEASE (Right)  SURGEON:  Surgeon(s) and Role:    * Vickki HearingStanley E Blayton Huttner, MD - Primary  PHYSICIAN ASSISTANT:   ASSISTANTS: none   ANESTHESIA:   regional  EBL:  Total I/O In: 100 [I.V.:100] Out: 0   BLOOD ADMINISTERED:none  DRAINS: none   LOCAL MEDICATIONS USED:  MARCAINE     SPECIMEN:  No Specimen  DISPOSITION OF SPECIMEN:  N/A  COUNTS:  YES  TOURNIQUET:  * Missing tourniquet times found for documented tourniquets in log:  [id number] *  DICTATION: .Dragon Dictation  PLAN OF CARE: Discharge to home after PACU  PATIENT DISPOSITION:  PACU - hemodynamically stable.   Delay start of Pharmacological VTE agent (>24hrs) due to surgical blood loss or risk of bleeding: not applicable  Carpal tunnel release right wrist  Operative findings compression of the right median nerveoccupied lesions  Indications failure of conservative treatment to relieve pain and paresthesias and numbness and tingling of the right hand.  The patient was identified in the preop area we confirm the surgical site marked as right wrist. Chart update completed. Patient taken to surgery. She had 2 g of Ancef. After establishing a Bier block her arm was prepped with ChloraPrep.  Timeout executed completed and confirmed site.  A straight incision was made over the carpal tunnel in line with the radial border of the ring finger. Blunt dissection was carried out to find the distal aspect of the carpal tunnel. A blunted judgment was passed beneath the carpal tunnel. Sharp incision was then used to release the transverse carpal ligament. The contents of the carpal tunnel were inspected. The median nerve was compressed with slight discoloration.  The wound was irrigated and then closed with  3-0 nylon suture. We injected 10 mL of plain Marcaine on the radial side of the incision  A sterile bandage was applied and the tourniquet was released the color of the hand and capillary refill were normal  The patient was taken to the recovery room in stable condition  [phone]

## 2015-12-11 ENCOUNTER — Ambulatory Visit (INDEPENDENT_AMBULATORY_CARE_PROVIDER_SITE_OTHER): Payer: Medicare Other | Admitting: Psychiatry

## 2015-12-11 ENCOUNTER — Encounter (HOSPITAL_COMMUNITY): Payer: Self-pay | Admitting: Psychiatry

## 2015-12-11 VITALS — BP 91/72 | HR 85 | Ht 64.0 in | Wt 221.0 lb

## 2015-12-11 DIAGNOSIS — Z79899 Other long term (current) drug therapy: Secondary | ICD-10-CM

## 2015-12-11 DIAGNOSIS — F331 Major depressive disorder, recurrent, moderate: Secondary | ICD-10-CM

## 2015-12-11 DIAGNOSIS — Z888 Allergy status to other drugs, medicaments and biological substances status: Secondary | ICD-10-CM

## 2015-12-11 MED ORDER — ALPRAZOLAM 1 MG PO TABS: 1.0000 mg | ORAL_TABLET | Freq: Every day | ORAL | 2 refills | Status: DC

## 2015-12-11 MED ORDER — DULOXETINE HCL 60 MG PO CPEP: 60.0000 mg | ORAL_CAPSULE | Freq: Two times a day (BID) | ORAL | 2 refills | Status: DC
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
## 2015-12-12 ENCOUNTER — Encounter (HOSPITAL_COMMUNITY): Payer: Self-pay | Admitting: Orthopedic Surgery

## 2015-12-15 ENCOUNTER — Other Ambulatory Visit: Payer: Self-pay | Admitting: Family Medicine

## 2015-12-15 DIAGNOSIS — M549 Dorsalgia, unspecified: Secondary | ICD-10-CM

## 2015-12-16 ENCOUNTER — Other Ambulatory Visit: Payer: Self-pay | Admitting: Family Medicine

## 2015-12-16 ENCOUNTER — Ambulatory Visit (INDEPENDENT_AMBULATORY_CARE_PROVIDER_SITE_OTHER): Payer: Self-pay | Admitting: Orthopedic Surgery

## 2015-12-16 DIAGNOSIS — Z4889 Encounter for other specified surgical aftercare: Secondary | ICD-10-CM

## 2015-12-16 NOTE — Progress Notes (Signed)
Patient ID: Rica RecordsAngeline L Pearson, female   DOB: 01/23/1966, 50 y.o.   MRN: [id number]  Post op visit   Chief Complaint  Patient presents with  . Wound Check    carpal tunnel Rt DOS 12/10/15    LMP 03/18/2013   Postop day 6 right carpal tunnel release doing well, wound is clean, numbness has resolved  Dry dressing, Ace wrap  Return for suture removal postop day 14

## 2015-12-16 NOTE — Patient Instructions (Signed)
Keep clean   Keep dry

## 2015-12-22 ENCOUNTER — Ambulatory Visit (INDEPENDENT_AMBULATORY_CARE_PROVIDER_SITE_OTHER): Payer: Medicare Other | Admitting: Psychology

## 2015-12-22 ENCOUNTER — Encounter (HOSPITAL_COMMUNITY): Payer: Self-pay | Admitting: Psychology

## 2015-12-22 VITALS — BP 127/79 | HR 80 | Wt 217.0 lb

## 2015-12-22 DIAGNOSIS — F331 Major depressive disorder, recurrent, moderate: Secondary | ICD-10-CM

## 2015-12-22 NOTE — Progress Notes (Signed)
Patient:  Tami Pearson   DOB: 01/17/1966  MR Number: [id number]  Location: [facility] [address] [place] [address] Dept: [phone]  Start: 3 PM End: 4 PM  Provider/Observer:     Hershal CoriaJohn R Paisyn Guercio PSYD  Chief Complaint:      Chief Complaint  Patient presents with  . Depression    Reason For Service:   The patient is a 50 year old married female who currently lives with her mother-in-law and [place]. Patient was initially referred by Dr. Lodema HongSimpson due to ongoing depression. She reports that she has dealt with depression throughout her life. She reports a very tumultuous childhood with conflicts between her and her siblings. She reports at least one incident where she attempted suicide through overdosing when she was younger and also had a suicide attempt in 2006 after finding out her husband had a child with another woman. She reports that her husband continues to have extramarital relationships continuing to this day.  The patient reports that she and her mother became very close and she experienced increased depression after her mother's death in 682015 from cancer. The patient is ongoing symptoms include significant crying spells, social withdrawal, feelings of helplessness and hopelessness, and negative impact on her attention concentration other cognitive functioning. The patient describes fatigue and feelings of helplessness and hopelessness. She continues to report passive suicidal ideation but denies a plan. She does not have any support through her siblings as they were very poor relationship.  Interventions Strategy:  Cognitive/behavioral psychotherapeutic interventions   Participation Level:   Active  Participation Quality:  Appropriate      Behavioral Observation:  Well Groomed, Alert, and Depressed.   Current Psychosocial Factors: The patient reports  that she has been doing poorly due to physical limitation and difficulties due to agitation being around others.     Content of Session:   Review current symptoms and work on therapeutic interventions around building better coping skills for recurrent sustained issues of depression.   Current Status:   The patient reports continued symptoms of depression and aggitiation.     Patient Progress:   Stable   Target Goals:   Target goals include improving coping and adaptive skills around issues of recurrent depression.    Last Reviewed:   10/22/2015   Goals Addressed Today:    Today we worked on building initial coping skills around issues of depression and isolation.   Impression/Diagnosis:   The patient is long history of recurrent major depressive issues that have been exacerbated by the death of her mother in 382015. Ongoing psychosocial stressors include lack of support within her family and ongoing extramarital affairs by her husband.   Diagnosis:    Axis I: Major depressive disorder, recurrent episode, moderate (HCC)     Stana Bayon R, PsyD 12/22/2015

## 2015-12-22 NOTE — Progress Notes (Signed)
Patient:  Tami Pearson   DOB: 11/11/1965  MR Number: [id number]  Location: [facility] [address] [place] [address] Dept: [phone]  Start: 3 PM End: 4 PM  Provider/Observer:     Hershal CoriaJohn R Jaylenne Hamelin PSYD  Chief Complaint:      Chief Complaint  Patient presents with  . Depression    Reason For Service:   The patient is a 50 year old married female who currently lives with her mother-in-law and [place]. Patient was initially referred by Dr. Lodema HongSimpson due to ongoing depression. She reports that she has dealt with depression throughout her life. She reports a very tumultuous childhood with conflicts between her and her siblings. She reports at least one incident where she attempted suicide through overdosing when she was younger and also had a suicide attempt in 2006 after finding out her husband had a child with another woman. She reports that her husband continues to have extramarital relationships continuing to this day.  The patient reports that she and her mother became very close and she experienced increased depression after her mother's death in 232015 from cancer. The patient is ongoing symptoms include significant crying spells, social withdrawal, feelings of helplessness and hopelessness, and negative impact on her attention concentration other cognitive functioning. The patient describes fatigue and feelings of helplessness and hopelessness. She continues to report passive suicidal ideation but denies a plan. She does not have any support through her siblings as they were very poor relationship.  Interventions Strategy:  Cognitive/behavioral psychotherapeutic interventions   Participation Level:   Active  Participation Quality:  Appropriate      Behavioral Observation:  Well Groomed, Alert, and Depressed.   Current Psychosocial Factors: The patient reports  that she has been doing poorly due to physical limitation and difficulties due to agitation being around others.     Content of Session:   Review current symptoms and work on therapeutic interventions around building better coping skills for recurrent sustained issues of depression.   Current Status:   The patient reports continued symptoms of depression and aggitiation.     Patient Progress:   Stable   Target Goals:   Target goals include improving coping and adaptive skills around issues of recurrent depression.    Last Reviewed:   11/25/2015   Goals Addressed Today:    Today we worked on building initial coping skills around issues of depression and isolation.   Impression/Diagnosis:   The patient is long history of recurrent major depressive issues that have been exacerbated by the death of her mother in 152015. Ongoing psychosocial stressors include lack of support within her family and ongoing extramarital affairs by her husband.   Diagnosis:    Axis I: Major depressive disorder, recurrent episode, moderate (HCC)     Rishawn Walck R, PsyD 12/22/2015

## 2015-12-22 NOTE — Progress Notes (Signed)
Patient:  Tami Pearson   DOB: 09/10/1965  MR Number: [id number]  Location: [facility] [address] [place] [address] Dept: [phone]  Start: 9 AM End: 10 AM  Provider/Observer:     Hershal CoriaJohn R Vitali Seibert PSYD  Chief Complaint:      Chief Complaint  Patient presents with  . Anxiety  . Depression  . Stress    Reason For Service:   The patient is a 50 year old married female who currently lives with her mother-in-law and [place]. Patient was initially referred by Dr. Lodema HongSimpson due to ongoing depression. She reports that she has dealt with depression throughout her life. She reports a very tumultuous childhood with conflicts between her and her siblings. She reports at least one incident where she attempted suicide through overdosing when she was younger and also had a suicide attempt in 2006 after finding out her husband had a child with another woman. She reports that her husband continues to have extramarital relationships continuing to this day.  The patient reports that she and her mother became very close and she experienced increased depression after her mother's death in 402015 from cancer. The patient is ongoing symptoms include significant crying spells, social withdrawal, feelings of helplessness and hopelessness, and negative impact on her attention concentration other cognitive functioning. The patient describes fatigue and feelings of helplessness and hopelessness. She continues to report passive suicidal ideation but denies a plan. She does not have any support through her siblings as they were very poor relationship.  Interventions Strategy:  Cognitive/behavioral psychotherapeutic interventions   Participation Level:   Active  Participation Quality:  Appropriate      Behavioral Observation:  Well Groomed, Alert, and Depressed.   Current Psychosocial  Factors: The patient reports that she has continued to do better.  She reports that she has lost weight and is working on being around others more.     Content of Session:   Review current symptoms and work on therapeutic interventions around building better coping skills for recurrent sustained issues of depression.   Current Status:   The patient reports continued symptoms of depression but some of the stress has been lifted to to getting her SSD approved.     Patient Progress:   Stable   Target Goals:   Target goals include improving coping and adaptive skills around issues of recurrent depression.    Last Reviewed:   12/22/2015   Goals Addressed Today:    Today we worked on building initial coping skills around issues of depression and isolation.   Impression/Diagnosis:   The patient is long history of recurrent major depressive issues that have been exacerbated by the death of her mother in 502015. Ongoing psychosocial stressors include lack of support within her family and ongoing extramarital affairs by her husband.   Diagnosis:    Axis I: Major depressive disorder, recurrent episode, moderate (HCC)     Maisha Bogen R, PsyD 12/22/2015

## 2015-12-24 ENCOUNTER — Other Ambulatory Visit: Payer: Self-pay

## 2015-12-24 ENCOUNTER — Ambulatory Visit (INDEPENDENT_AMBULATORY_CARE_PROVIDER_SITE_OTHER): Payer: Medicare Other | Admitting: Orthopedic Surgery

## 2015-12-24 DIAGNOSIS — G5601 Carpal tunnel syndrome, right upper limb: Secondary | ICD-10-CM

## 2015-12-24 DIAGNOSIS — I10 Essential (primary) hypertension: Secondary | ICD-10-CM

## 2015-12-24 DIAGNOSIS — Z4889 Encounter for other specified surgical aftercare: Secondary | ICD-10-CM

## 2015-12-24 MED ORDER — LISINOPRIL-HYDROCHLOROTHIAZIDE 20-25 MG PO TABS: 1.0000 | ORAL_TABLET | Freq: Every day | ORAL | 0 refills | Status: DC

## 2015-12-24 NOTE — Progress Notes (Signed)
  Ed is status post right carpal tunnel release   S:   Chief Complaint  Patient presents with  . Follow-up    post op CTR, DOS 12/10/15   Patient reports that they're doing well with mild discomfort.  The sutures were removed   The incision healed nicely and the carpal tunnel symptoms have resolved   Released

## 2016-01-02 ENCOUNTER — Other Ambulatory Visit: Payer: Self-pay

## 2016-01-02 DIAGNOSIS — H25813 Combined forms of age-related cataract, bilateral: Secondary | ICD-10-CM | POA: Diagnosis not present

## 2016-01-02 DIAGNOSIS — Z7984 Long term (current) use of oral hypoglycemic drugs: Secondary | ICD-10-CM | POA: Diagnosis not present

## 2016-01-02 DIAGNOSIS — H5213 Myopia, bilateral: Secondary | ICD-10-CM | POA: Diagnosis not present

## 2016-01-02 DIAGNOSIS — M170 Bilateral primary osteoarthritis of knee: Secondary | ICD-10-CM

## 2016-01-02 DIAGNOSIS — E119 Type 2 diabetes mellitus without complications: Secondary | ICD-10-CM | POA: Diagnosis not present

## 2016-01-02 MED ORDER — OXYCODONE-ACETAMINOPHEN 10-325 MG PO TABS: 1.0000 | ORAL_TABLET | ORAL | 0 refills | Status: DC | PRN

## 2016-01-05 DIAGNOSIS — E1169 Type 2 diabetes mellitus with other specified complication: Secondary | ICD-10-CM | POA: Diagnosis not present

## 2016-01-05 DIAGNOSIS — E559 Vitamin D deficiency, unspecified: Secondary | ICD-10-CM | POA: Diagnosis not present

## 2016-01-05 DIAGNOSIS — I10 Essential (primary) hypertension: Secondary | ICD-10-CM | POA: Diagnosis not present

## 2016-01-05 DIAGNOSIS — E785 Hyperlipidemia, unspecified: Secondary | ICD-10-CM | POA: Diagnosis not present

## 2016-01-05 DIAGNOSIS — E669 Obesity, unspecified: Secondary | ICD-10-CM | POA: Diagnosis not present

## 2016-01-07 ENCOUNTER — Other Ambulatory Visit: Payer: Self-pay | Admitting: Family Medicine

## 2016-01-07 DIAGNOSIS — I10 Essential (primary) hypertension: Secondary | ICD-10-CM

## 2016-01-08 ENCOUNTER — Telehealth: Payer: Self-pay | Admitting: General Practice

## 2016-01-08 ENCOUNTER — Encounter: Payer: Self-pay | Admitting: Family Medicine

## 2016-01-08 ENCOUNTER — Ambulatory Visit (INDEPENDENT_AMBULATORY_CARE_PROVIDER_SITE_OTHER): Payer: Medicare Other | Admitting: Family Medicine

## 2016-01-08 VITALS — BP 122/84 | HR 79 | Resp 16 | Ht 64.0 in | Wt 215.0 lb

## 2016-01-08 DIAGNOSIS — Z0000 Encounter for general adult medical examination without abnormal findings: Secondary | ICD-10-CM

## 2016-01-08 DIAGNOSIS — N3001 Acute cystitis with hematuria: Secondary | ICD-10-CM

## 2016-01-08 LAB — POCT URINALYSIS DIPSTICK
BILIRUBIN UA: NEGATIVE
GLUCOSE UA: NEGATIVE
Ketones, UA: NEGATIVE
NITRITE UA: NEGATIVE
SPEC GRAV UA: 1.025
Urobilinogen, UA: 0.2
pH, UA: 6

## 2016-01-08 NOTE — Patient Instructions (Signed)
F/u in 4 months, call if you need me sooner  It is important that you exercise regularly at least 30 minutes 5 times a week. If you develop chest pain, have severe difficulty breathing, or feel very tired, stop exercising immediately and seek medical attention   Please de clutter home so you reduce fall risk  Continue to follow low fat, low carb diet so that you continue to lose weight ' Please review the new pain management policy that we discussed at the visit which takes effect in  January, plan is to wean entirely off of pain medication as you improve your health   Thank you  for choosing [facility]. We consider it a privelige to serve you.  Delivering excellent health care in a caring and  compassionate way is our goal.  Partnering with you,  so that together we can achieve this goal is our strategy.

## 2016-01-08 NOTE — Assessment & Plan Note (Signed)
Chronic pain management and new protocol for collecting medication discussed with patient, she is aware, will collect 90 day supply first week in January

## 2016-01-08 NOTE — Telephone Encounter (Signed)
Patient called in to schedule her screening tcs.  She can be reached at [phone].  I confirmed she's not having any Gi problems, not taking any blood thinners.  Routing to [employer].

## 2016-01-08 NOTE — Assessment & Plan Note (Signed)

## 2016-01-08 NOTE — Progress Notes (Signed)
Preventive Screening-Counseling & Management   Patient present here today for a welcome to  Medicare  visit.   Current Problems (verified)   Medications Prior to Visit Allergies (verified)   PAST HISTORY  Family History  Social History    Risk Factors  Current exercise habits:  Nothing regularly, will commit to bed and chair exercises however, 30 mins at least 5 days per week  Dietary issues discussed:Low carb , reducing sweets , eats a lot of vegetables, baked foods  Cardiac risk factors: hypertension  Dep[facility] Screen [facility], not suicidal or homicidal (Note: if answer to either of the following is "Yes", a more complete depression screening is indicated)   Over the past two weeks, have you felt down, depressed or hopeless? A little  Over the past two weeks, have you felt little interest or pleasure in doing things? No  Have you lost interest or pleasure in daily life? No  Do you often feel hopeless? Stressed with regard to her husband Do you cry easily over simple problems? No   Activities of Daily Living  In your present state of health, do you have any difficulty performing the following activities?  Driving?: License revoked approx 3 years ago due to poor vision Managing money?: No Feeding yourself?:No Getting from bed to chair?:No Climbing a flight of stairs?:yes Preparing food and eating?:No Bathing or showering?:No Getting dressed?:No Getting to the toilet?:No Using the toilet?:No Moving around from place to place?:yes relies on cane for safety  Fall Risk Assessment In the past year have you fallen or had a near fall?:No Are you currently taking any medications that make you dizzy?:No   Hearing Difficulties: No Do you often ask people to speak up or repeat themselves?:No Do you experience ringing or noises in your ears?:No Do you have difficulty understanding soft or whispered voices?:No  Cognitive Testing  Alert? Yes Normal Appearance?Yes   Oriented to person? Yes Place? Yes  Time? Yes  Displays appropriate judgment?Yes  Can read the correct time from a watch face? yes Are you having problems remembering things?No  Advanced Directives have been discussed with the patient?Yes , and material provided   List the Names of Other Physician/Practitioners you currently use:  Dr Romeo AppleHarrison, Tenny Crawoss, Rodenbaugh  Indicate any recent Medical Services you may have received from other than [facility] providers in the past year (date may be approximate).     Medicare Attestation  I have personally reviewed:  The patient's medical and social history  Their use of alcohol, tobacco or illicit drugs  Their current medications and supplements  The patient's functional ability including ADLs,fall risks, home safety risks, cognitive, and hearing and visual impairment  Diet and physical activities  Evidence for depression or mood disorders  The patient's weight, height, BMI, and visual acuity have been recorded in the chart. I have made referrals, counseling, and provided education to the patient based on review of the above and I have provided the patient with a written personalized care plan for preventive services.    Physical Exam BP 122/84   Pulse 79   Resp 16   Ht 5\' 4"  (1.626 m)   Wt 215 lb (97.5 kg)   LMP 03/18/2013   SpO2 99%   BMI 36.90 kg/m    Assessment & Plan:  Welcome to Medicare preventive visit Annual exam as documented. Counseling done  re healthy lifestyle involving commitment to 150 minutes exercise per week, heart healthy diet, and attaining healthy weight.The importance of adequate  sleep also discussed. Regular seat belt use and home safety, is also discussed. Changes in health habits are decided on by the patient with goals and time frames  set for achieving them. Immunization and cancer screening needs are specifically addressed at this visit.

## 2016-01-08 NOTE — Addendum Note (Signed)
Addended by: Abner GreenspanHUDY, Welborn Keena H on: 01/08/2016 03:36 PM   Modules accepted: Orders

## 2016-01-09 ENCOUNTER — Other Ambulatory Visit (HOSPITAL_COMMUNITY)
Admission: RE | Admit: 2016-01-09 | Discharge: 2016-01-09 | Disposition: A | Discharge status: Home / Self Care | Payer: Medicare Other | Source: Other Acute Inpatient Hospital | Attending: Family Medicine | Admitting: Family Medicine

## 2016-01-09 DIAGNOSIS — N3001 Acute cystitis with hematuria: Secondary | ICD-10-CM | POA: Insufficient documentation

## 2016-01-09 DIAGNOSIS — Z0000 Encounter for general adult medical examination without abnormal findings: Secondary | ICD-10-CM | POA: Diagnosis not present

## 2016-01-11 LAB — URINE CULTURE

## 2016-01-12 ENCOUNTER — Other Ambulatory Visit: Payer: Self-pay | Admitting: Family Medicine

## 2016-01-12 ENCOUNTER — Telehealth: Payer: Self-pay

## 2016-01-13 NOTE — Telephone Encounter (Signed)
Pt has been scheduled an ov with Lewie LoronAnna Boone, NP on 01/19/2016 at 2:30 pm due to her med list.

## 2016-01-13 NOTE — Telephone Encounter (Signed)
See separate note, pt has ov appt.

## 2016-01-19 ENCOUNTER — Ambulatory Visit (INDEPENDENT_AMBULATORY_CARE_PROVIDER_SITE_OTHER): Payer: Medicare Other | Admitting: Gastroenterology

## 2016-01-19 ENCOUNTER — Other Ambulatory Visit: Payer: Self-pay

## 2016-01-19 ENCOUNTER — Encounter: Payer: Self-pay | Admitting: Gastroenterology

## 2016-01-19 DIAGNOSIS — Z1211 Encounter for screening for malignant neoplasm of colon: Secondary | ICD-10-CM | POA: Diagnosis not present

## 2016-01-19 MED ORDER — PEG 3350-KCL-NA BICARB-NACL 420 G PO SOLR: 4000.0000 mL | ORAL | 0 refills | Status: DC

## 2016-01-19 NOTE — Patient Instructions (Signed)
Keep up the great work with weight loss! I am proud of you.  We have scheduled you for a colonoscopy with Dr. Darrick PennaFields in the near future.  Have a wonderful holiday season!

## 2016-01-19 NOTE — Assessment & Plan Note (Signed)
50 year old pleasant female with need for initial screening colonoscopy. No concerning lower or upper GI symptoms. Applauded on weight loss efforts thus far.   Proceed with colonoscopy with Dr. Darrick PennaFields in the near future. The risks, benefits, and alternatives have been discussed in detail with the patient. They state understanding and desire to proceed.  PROPOFOL due to polypharmacy
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
## 2016-01-20 NOTE — Progress Notes (Signed)
cc'ed to pcp °

## 2016-01-21 ENCOUNTER — Ambulatory Visit (HOSPITAL_COMMUNITY): Payer: Self-pay | Admitting: Psychology

## 2016-01-24 NOTE — Progress Notes (Signed)
REVIEWED-NO ADDITIONAL RECOMMENDATIONS. 

## 2016-01-27 ENCOUNTER — Ambulatory Visit (HOSPITAL_COMMUNITY): Payer: Self-pay | Admitting: Psychology

## 2016-01-28 ENCOUNTER — Ambulatory Visit (HOSPITAL_COMMUNITY): Payer: Self-pay | Admitting: Psychology

## 2016-01-30 ENCOUNTER — Telehealth: Payer: Self-pay | Admitting: Family Medicine

## 2016-01-30 ENCOUNTER — Other Ambulatory Visit: Payer: Self-pay

## 2016-01-30 ENCOUNTER — Other Ambulatory Visit: Payer: Self-pay | Admitting: Family Medicine

## 2016-01-30 MED ORDER — OXYCODONE-ACETAMINOPHEN 10-325 MG PO TABS: ORAL_TABLET | ORAL | 0 refills | Status: DC

## 2016-01-30 NOTE — Telephone Encounter (Signed)
Script entered historically for oxycodone 3 times daily.Provide her with the 3 months , Jan Feb anmd March at that dose pls, Probably needs appt moved up to end March, will need to be seen before 4th script printed, and we ar e weaning ?? pls ask

## 2016-01-30 NOTE — Telephone Encounter (Signed)
Done

## 2016-02-06 ENCOUNTER — Ambulatory Visit (HOSPITAL_COMMUNITY): Payer: Medicare Other | Admitting: Psychology

## 2016-02-06 ENCOUNTER — Inpatient Hospital Stay (HOSPITAL_COMMUNITY): Admission: RE | Admit: 2016-02-06 | Payer: Self-pay | Source: Ambulatory Visit

## 2016-02-09 NOTE — Patient Instructions (Signed)
Ethelyne Jodie EchevariaL Eyerman  02/09/2016     @PREFPERIOPPHARMACY @   Your procedure is scheduled on  02/17/2016   Report [facility] at   830   A.M.  Call this number if you have problems the morning of surgery:  [phone]   Remember:  Do not eat food or drink liquids after midnight.  Take these medicines the morning of surgery with A SIP OF WATER  Xanax, cymbalta, neurontin, lisinopril, oxycodone. Stop your phentermine until after your procedure. DO NOT take any medicines for diabetes the morning of surgery.   Do not wear jewelry, make-up or nail polish.  Do not wear lotions, powders, or perfumes, or deoderant.  Do not shave 48 hours prior to surgery.  Men may shave face and neck.  Do not bring valuables to the hospital.  [facility] is not responsible for any belongings or valuables.  Contacts, dentures or bridgework may not be worn into surgery.  Leave your suitcase in the car.  After surgery it may be brought to your room.  For patients admitted to the hospital, discharge time will be determined by your treatment team.  Patients discharged the day of surgery will not be allowed to drive home.   Name and phone number of your driver:   family Special instructions:  Follow the diet and prep instructions given to you by Dr Evelina DunField's office.  Please read over the following fact sheets that you were given. Anesthesia Post-op Instructions and Care and Recovery After Surgery       Colonoscopy, Adult A colonoscopy is an exam to look at the entire large intestine. During the exam, a lubricated, bendable tube is inserted into the anus and then passed into the rectum, colon, and other parts of the large intestine. A colonoscopy is often done as a part of normal colorectal screening or in response to certain symptoms, such as anemia, persistent diarrhea, abdominal pain, and blood in the stool. The exam can help screen for and diagnose medical problems,  including:  Tumors.  Polyps.  Inflammation.  Areas of bleeding. Tell a health care provider about:  Any allergies you have.  All medicines you are taking, including vitamins, herbs, eye drops, creams, and over-the-counter medicines.  Any problems you or family members have had with anesthetic medicines.  Any blood disorders you have.  Any surgeries you have had.  Any medical conditions you have.  Any problems you have had passing stool. What are the risks? Generally, this is a safe procedure. However, problems may occur, including:  Bleeding.  A tear in the intestine.  A reaction to medicines given during the exam.  Infection (rare). What happens before the procedure? Eating and drinking restrictions  Follow instructions from your health care provider about eating and drinking, which may include:  A few days before the procedure - follow a low-fiber diet. Avoid nuts, seeds, dried fruit, raw fruits, and vegetables.  1-3 days before the procedure - follow a clear liquid diet. Drink only clear liquids, such as clear broth or bouillon, black coffee or tea, clear juice, clear soft drinks or sports drinks, gelatin desert, and popsicles. Avoid any liquids that contain red or purple dye.  On the day of the procedure - do not eat or drink anything during the 2 hours before the procedure, or within the time period that your health care provider recommends. Bowel prep  If you were prescribed an oral bowel prep  to clean out your colon:  Take it as told by your health care provider. Starting the day before your procedure, you will need to drink a large amount of medicated liquid. The liquid will cause you to have multiple loose stools until your stool is almost clear or light green.  If your skin or anus gets irritated from diarrhea, you may use these to relieve the irritation:  Medicated wipes, such as adult wet wipes with aloe and vitamin E.  A skin soothing-product like  petroleum jelly.  If you vomit while drinking the bowel prep, take a break for up to 60 minutes and then begin the bowel prep again. If vomiting continues and you cannot take the bowel prep without vomiting, call your health care provider. General instructions  Ask your health care provider about changing or stopping your regular medicines. This is especially important if you are taking diabetes medicines or blood thinners.  Plan to have someone take you home from the hospital or clinic. What happens during the procedure?  An IV tube may be inserted into one of your veins.  You will be given medicine to help you relax (sedative).  To reduce your risk of infection:  Your health care team will wash or sanitize their hands.  Your anal area will be washed with soap.  You will be asked to lie on your side with your knees bent.  Your health care provider will lubricate a long, thin, flexible tube. The tube will have a camera and a light on the end.  The tube will be inserted into your anus.  The tube will be gently eased through your rectum and colon.  Air will be delivered into your colon to keep it open. You may feel some pressure or cramping.  The camera will be used to take images during the procedure.  A small tissue sample may be removed from your body to be examined under a microscope (biopsy). If any potential problems are found, the tissue will be sent to a lab for testing.  If small polyps are found, your health care provider may remove them and have them checked for cancer cells.  The tube that was inserted into your anus will be slowly removed. The procedure may vary among health care providers and hospitals. What happens after the procedure?  Your blood pressure, heart rate, breathing rate, and blood oxygen level will be monitored until the medicines you were given have worn off.  Do not drive for 24 hours after the exam.  You may have a small amount of blood in  your stool.  You may pass gas and have mild abdominal cramping or bloating due to the air that was used to inflate your colon during the exam.  It is up to you to get the results of your procedure. Ask your health care provider, or the department performing the procedure, when your results will be ready. This information is not intended to replace advice given to you by your health care provider. Make sure you discuss any questions you have with your health care provider. Document Released: 01/16/2000 Document Revised: 08/08/2015 Document Reviewed: 04/01/2015 Elsevier Interactive Patient Education  2017 [employer]  Colonoscopy, Adult, Care After This sheet gives you information about how to care for yourself after your procedure. Your health care provider may also give you more specific instructions. If you have problems or questions, contact your health care provider. What can I expect after the procedure? After the procedure, it  is common to have:  A small amount of blood in your stool for 24 hours after the procedure.  Some gas.  Mild abdominal cramping or bloating. Follow these instructions at home: General instructions  For the first 24 hours after the procedure:  Do not drive or use machinery.  Do not sign important documents.  Do not drink alcohol.  Do your regular daily activities at a slower pace than normal.  Eat soft, easy-to-digest foods.  Rest often.  Take over-the-counter or prescription medicines only as told by your health care provider.  It is up to you to get the results of your procedure. Ask your health care provider, or the department performing the procedure, when your results will be ready. Relieving cramping and bloating  Try walking around when you have cramps or feel bloated.  Apply heat to your abdomen as told by your health care provider. Use a heat source that your health care provider recommends, such as a moist heat pack or a heating  pad.  Place a towel between your skin and the heat source.  Leave the heat on for 20-30 minutes.  Remove the heat if your skin turns bright red. This is especially important if you are unable to feel pain, heat, or cold. You may have a greater risk of getting burned. Eating and drinking  Drink enough fluid to keep your urine clear or pale yellow.  Resume your normal diet as instructed by your health care provider. Avoid heavy or fried foods that are hard to digest.  Avoid drinking alcohol for as long as instructed by your health care provider. Contact a health care provider if:  You have blood in your stool 2-3 days after the procedure. Get help right away if:  You have more than a small spotting of blood in your stool.  You pass large blood clots in your stool.  Your abdomen is swollen.  You have nausea or vomiting.  You have a fever.  You have increasing abdominal pain that is not relieved with medicine. This information is not intended to replace advice given to you by your health care provider. Make sure you discuss any questions you have with your health care provider. Document Released: 09/02/2003 Document Revised: 10/13/2015 Document Reviewed: 04/01/2015 Elsevier Interactive Patient Education  2017 [employer]  Monitored Anesthesia Care Anesthesia is a term that refers to techniques, procedures, and medicines that help a person stay safe and comfortable during a medical procedure. Monitored anesthesia care, or sedation, is one type of anesthesia. Your anesthesia specialist may recommend sedation if you will be having a procedure that does not require you to be unconscious, such as:  Cataract surgery.  A dental procedure.  A biopsy.  A colonoscopy. During the procedure, you may receive a medicine to help you relax (sedative). There are three levels of sedation:  Mild sedation. At this level, you may feel awake and relaxed. You will be able to follow  directions.  Moderate sedation. At this level, you will be sleepy. You may not remember the procedure.  Deep sedation. At this level, you will be asleep. You will not remember the procedure. The more medicine you are given, the deeper your level of sedation will be. Depending on how you respond to the procedure, the anesthesia specialist may change your level of sedation or the type of anesthesia to fit your needs. An anesthesia specialist will monitor you closely during the procedure. Let your health care provider know about:  Any  allergies you have.  All medicines you are taking, including vitamins, herbs, eye drops, creams, and over-the-counter medicines.  Any use of steroids (by mouth or as a cream).  Any problems you or family members have had with sedatives and anesthetic medicines.  Any blood disorders you have.  Any surgeries you have had.  Any medical conditions you have, such as sleep apnea.  Whether you are pregnant or may be pregnant.  Any use of cigarettes, alcohol, or street drugs. What are the risks? Generally, this is a safe procedure. However, problems may occur, including:  Getting too much medicine (oversedation).  Nausea.  Allergic reaction to medicines.  Trouble breathing. If this happens, a breathing tube may be used to help with breathing. It will be removed when you are awake and breathing on your own.  Heart trouble.  Lung trouble. Before the procedure Staying hydrated  Follow instructions from your health care provider about hydration, which may include:  Up to 2 hours before the procedure - you may continue to drink clear liquids, such as water, clear fruit juice, black coffee, and plain tea. Eating and drinking restrictions  Follow instructions from your health care provider about eating and drinking, which may include:  8 hours before the procedure - stop eating heavy meals or foods such as meat, fried foods, or fatty foods.  6 hours  before the procedure - stop eating light meals or foods, such as toast or cereal.  6 hours before the procedure - stop drinking milk or drinks that contain milk.  2 hours before the procedure - stop drinking clear liquids. Medicines  Ask your health care provider about:  Changing or stopping your regular medicines. This is especially important if you are taking diabetes medicines or blood thinners.  Taking medicines such as aspirin and ibuprofen. These medicines can thin your blood. Do not take these medicines before your procedure if your health care provider instructs you not to. Tests and exams  You will have a physical exam.  You may have blood tests done to show:  How well your kidneys and liver are working.  How well your blood can clot.  General instructions  Plan to have someone take you home from the hospital or clinic.  If you will be going home right after the procedure, plan to have someone with you for 24 hours. What happens during the procedure?  Your blood pressure, heart rate, breathing, level of pain and overall condition will be monitored.  An IV tube will be inserted into one of your veins.  Your anesthesia specialist will give you medicines as needed to keep you comfortable during the procedure. This may mean changing the level of sedation.  The procedure will be performed. After the procedure  Your blood pressure, heart rate, breathing rate, and blood oxygen level will be monitored until the medicines you were given have worn off.  Do not drive for 24 hours if you received a sedative.  You may:  Feel sleepy, clumsy, or nauseous.  Feel forgetful about what happened after the procedure.  Have a sore throat if you had a breathing tube during the procedure.  Vomit. This information is not intended to replace advice given to you by your health care provider. Make sure you discuss any questions you have with your health care provider. Document  Released: 10/14/2004 Document Revised: 06/27/2015 Document Reviewed: 05/11/2015 Elsevier Interactive Patient Education  2017 [employer] Monitored Anesthesia Care, Care After These instructions provide you with  information about caring for yourself after your procedure. Your health care provider may also give you more specific instructions. Your treatment has been planned according to current medical practices, but problems sometimes occur. Call your health care provider if you have any problems or questions after your procedure. What can I expect after the procedure? After your procedure, it is common to:  Feel sleepy for several hours.  Feel clumsy and have poor balance for several hours.  Feel forgetful about what happened after the procedure.  Have poor judgment for several hours.  Feel nauseous or vomit.  Have a sore throat if you had a breathing tube during the procedure. Follow these instructions at home: For at least 24 hours after the procedure:   Do not:  Participate in activities in which you could fall or become injured.  Drive.  Use heavy machinery.  Drink alcohol.  Take sleeping pills or medicines that cause drowsiness.  Make important decisions or sign legal documents.  Take care of children on your own.  Rest. Eating and drinking  Follow the diet that is recommended by your health care provider.  If you vomit, drink water, juice, or soup when you can drink without vomiting.  Make sure you have little or no nausea before eating solid foods. General instructions  Have a responsible adult stay with you until you are awake and alert.  Take over-the-counter and prescription medicines only as told by your health care provider.  If you smoke, do not smoke without supervision.  Keep all follow-up visits as told by your health care provider. This is important. Contact a health care provider if:  You keep feeling nauseous or you keep vomiting.  You  feel light-headed.  You develop a rash.  You have a fever. Get help right away if:  You have trouble breathing. This information is not intended to replace advice given to you by your health care provider. Make sure you discuss any questions you have with your health care provider. Document Released: 05/11/2015 Document Revised: 09/10/2015 Document Reviewed: 05/11/2015 Elsevier Interactive Patient Education  2017 [employer].

## 2016-02-11 ENCOUNTER — Encounter (HOSPITAL_COMMUNITY): Payer: Self-pay

## 2016-02-11 ENCOUNTER — Encounter (HOSPITAL_COMMUNITY)
Admission: RE | Admit: 2016-02-11 | Discharge: 2016-02-11 | Disposition: A | Discharge status: Home / Self Care | Payer: Medicare Other | Source: Ambulatory Visit | Attending: Gastroenterology | Admitting: Gastroenterology

## 2016-02-12 ENCOUNTER — Telehealth: Payer: Self-pay

## 2016-02-12 NOTE — Telephone Encounter (Signed)
Endo scheduler said pt didn't show up for her pre-op appt. Called pt. She wants to cancel colonoscopy scheduled for 02/17/16 because she had the flu. Endo scheduler informed. Procedure cancelled. Forwarding to AB, who seen pt at [facility].

## 2016-02-13 NOTE — Telephone Encounter (Signed)
Noted. Doris, she was with Propofol and with Dr. Darrick PennaFields. May we triage her for the future or does she have to come back in?

## 2016-02-16 NOTE — Telephone Encounter (Signed)
Forwarding to Dr. Fields to advise! 

## 2016-02-16 NOTE — Telephone Encounter (Signed)
OK TO TRIAGE PT FOR TCS/MAC.

## 2016-02-17 ENCOUNTER — Encounter (HOSPITAL_COMMUNITY): Admission: RE | Payer: Self-pay | Source: Ambulatory Visit

## 2016-02-17 ENCOUNTER — Ambulatory Visit (HOSPITAL_COMMUNITY): Admission: RE | Admit: 2016-02-17 | Payer: Medicare Other | Source: Ambulatory Visit | Admitting: Gastroenterology

## 2016-02-17 SURGERY — COLONOSCOPY WITH PROPOFOL
Anesthesia: Monitor Anesthesia Care

## 2016-02-19 ENCOUNTER — Ambulatory Visit (HOSPITAL_COMMUNITY): Payer: Self-pay | Admitting: Psychology

## 2016-02-24 ENCOUNTER — Other Ambulatory Visit: Payer: Self-pay

## 2016-02-24 ENCOUNTER — Telehealth: Payer: Self-pay | Admitting: Family Medicine

## 2016-02-24 ENCOUNTER — Telehealth: Payer: Self-pay

## 2016-02-24 ENCOUNTER — Other Ambulatory Visit: Payer: Self-pay | Admitting: Family Medicine

## 2016-02-24 MED ORDER — FLUCONAZOLE 150 MG PO TABS: 150.0000 mg | ORAL_TABLET | Freq: Once | ORAL | 0 refills | Status: AC

## 2016-02-24 MED ORDER — CEPHALEXIN 500 MG PO CAPS: 500.0000 mg | ORAL_CAPSULE | Freq: Four times a day (QID) | ORAL | 0 refills | Status: DC

## 2016-02-24 NOTE — Telephone Encounter (Signed)
Please advise 

## 2016-02-24 NOTE — Telephone Encounter (Signed)
Keflex x 1 week sent to pharmacy, she nEEDS to see dentist, pls let her know, offer and send fluconazole #1 only if needed also pls

## 2016-02-24 NOTE — Progress Notes (Signed)
Keflex

## 2016-02-24 NOTE — Telephone Encounter (Signed)
Patient aware and medication sent to pharmacy  

## 2016-02-24 NOTE — Telephone Encounter (Signed)
Tami Pearson is asking for an Antibiotic for a tooth abscess, please advise

## 2016-02-25 ENCOUNTER — Other Ambulatory Visit: Payer: Self-pay

## 2016-02-25 DIAGNOSIS — Z1211 Encounter for screening for malignant neoplasm of colon: Secondary | ICD-10-CM

## 2016-02-25 NOTE — Telephone Encounter (Signed)
REVIEWED. PT SHOULD HAVE PRIOR INSTRUCTIONS AND PREP.

## 2016-02-25 NOTE — Telephone Encounter (Signed)
Pt's pre-op appt is for 03/18/2016 at 11:00 AM.

## 2016-02-25 NOTE — Telephone Encounter (Signed)
Gastroenterology Pre-Procedure Review  Request Date: 02/24/2016 Requesting Physician: Dr. Darrick PennaFields  PATIENT REVIEW QUESTIONS: The patient responded to the following health history questions as indicated:    1. Diabetes Melitis: YES 2. Joint replacements in the past 12 months: no 3. Major health problems in the past 3 months: no 4. Has an artificial valve or MVP: no 5. Has a defibrillator: no 6. Has been advised in past to take antibiotics in advance of a procedure like teeth cleaning: no 7. Family history of colon cancer: no  8. Alcohol Use: no 9. History of sleep apnea: no  10. History of coronary artery or other vascular stents placed within the last 12 months: no    MEDICATIONS & ALLERGIES:    Patient reports the following regarding taking any blood thinners:   Plavix? no Aspirin? no Coumadin? no Brilinta? no Xarelto? no Eliquis? no Pradaxa? no Savaysa? no Effient? no  Patient confirms/reports the following medications:  Current Outpatient Prescriptions  Medication Sig Dispense Refill  . ALPRAZolam (XANAX) 1 MG tablet Take 1 tablet (1 mg total) by mouth at bedtime. 30 tablet 2  . atorvastatin (LIPITOR) 20 MG tablet TAKE ONE TABLET BY MOUTH ONCE DAILY *DISCONTINUE CRESTOR* (Patient taking differently: TAKE ONE TABLET BY MOUTH ONCE DAILY) 90 tablet 1  . cephALEXin (KEFLEX) 500 MG capsule Take 1 capsule (500 mg total) by mouth 4 (four) times daily. 28 capsule 0  . DULoxetine (CYMBALTA) 60 MG capsule Take 1 capsule (60 mg total) by mouth 2 (two) times daily. 60 capsule 2  . gabapentin (NEURONTIN) 400 MG capsule TAKE ONE CAPSULE BY MOUTH IN THE MORNING AND TWO AT BEDTIME 270 capsule 1  . hydrOXYzine (ATARAX/VISTARIL) 50 MG tablet TAKE ONE TABLET BY MOUTH AT BEDTIME 90 tablet 1  . lisinopril-hydrochlorothiazide (PRINZIDE,ZESTORETIC) 20-25 MG tablet TAKE ONE TABLET BY MOUTH ONCE DAILY 90 tablet 1  . metFORMIN (GLUCOPHAGE) 500 MG tablet TAKE ONE TABLET BY MOUTH TWICE DAILY WITH MEAL  (Patient taking differently: Take 500 mg by mouth 2 (two) times daily with a meal. ) 180 tablet 0  . [START ON 03/30/2016] oxyCODONE-acetaminophen (PERCOCET) 10-325 MG tablet One tablet three times daily 90 tablet 0  . phentermine (ADIPEX-P) 37.5 MG tablet Take 1 tablet (37.5 mg total) by mouth daily before breakfast. 30 tablet 3  . Vitamin D, Ergocalciferol, (DRISDOL) 50000 units CAPS capsule TAKE ONE CAPSULE BY MOUTH ONCE A WEEK (Patient taking differently: TAKE ONE CAPSULE BY MOUTH ONCE A WEEK ON SUNDAYS) 12 capsule 1  . acetaminophen (TYLENOL) 325 MG tablet Take 650 mg by mouth every 6 (six) hours as needed for mild pain.    . polyethylene glycol-electrolytes (TRILYTE) 420 g solution Take 4,000 mLs by mouth as directed. 4000 mL 0   No current facility-administered medications for this visit.     Patient confirms/reports the following allergies:  Allergies  Allergen Reactions  . Ibuprofen Nausea And Vomiting  . Tramadol Nausea Only    No orders of the defined types were placed in this encounter.   AUTHORIZATION INFORMATION Primary Insurance:  ID #:  Group #:  Pre-Cert / Auth required:  Pre-Cert / Auth #:   Secondary Insurance:   ID #:  Group #:  Pre-Cert / Auth required: Pre-Cert / Auth #:   SCHEDULE INFORMATION: Procedure has been scheduled as follows:  Date:  03/23/2016          Time:  7:30 AM Location: [facility] Short Stay  This Gastroenterology Pre-Precedure Review Form is  being routed to the following provider(s): Barney Drain, MD

## 2016-02-25 NOTE — Telephone Encounter (Signed)
See separate triage.  

## 2016-03-03 NOTE — Telephone Encounter (Signed)
Pt is aware of pre-op appt and I am mailing the updated information sheet. She has the prep.

## 2016-03-11 ENCOUNTER — Encounter (HOSPITAL_COMMUNITY): Payer: Self-pay

## 2016-03-11 ENCOUNTER — Ambulatory Visit (HOSPITAL_COMMUNITY): Payer: Medicare Other | Admitting: Psychiatry

## 2016-03-15 NOTE — Patient Instructions (Signed)
Natali ALFREDA HAMMAD  03/15/2016     @PREFPERIOPPHARMACY @   Your procedure is scheduled on  03/23/2016   Report to [facility] at  615  A.M.  Call this number if you have problems the morning of surgery:  [phone]   Remember:  Do not eat food or drink liquids after midnight.  Take these medicines the morning of surgery with A SIP OF WATER  Cymbalta, neurontin, lisinopril, oxycodone. Make sure you stop your phentermine.   Do not wear jewelry, make-up or nail polish.  Do not wear lotions, powders, or perfumes, or deoderant.  Do not shave 48 hours prior to surgery.  Men may shave face and neck.  Do not bring valuables to the hospital.  [facility] is not responsible for any belongings or valuables.  Contacts, dentures or bridgework may not be worn into surgery.  Leave your suitcase in the car.  After surgery it may be brought to your room.  For patients admitted to the hospital, discharge time will be determined by your treatment team.  Patients discharged the day of surgery will not be allowed to drive home.   Name and phone number of your driver:   family Special instructions: Follow the diet and prep instructions given to you by Dr Evelina Dun office.  Please read over the following fact sheets that you were given. Anesthesia Post-op Instructions and Care and Recovery After Surgery       Colonoscopy, Adult A colonoscopy is an exam to look at the entire large intestine. During the exam, a lubricated, bendable tube is inserted into the anus and then passed into the rectum, colon, and other parts of the large intestine. A colonoscopy is often done as a part of normal colorectal screening or in response to certain symptoms, such as anemia, persistent diarrhea, abdominal pain, and blood in the stool. The exam can help screen for and diagnose medical problems, including:  Tumors.  Polyps.  Inflammation.  Areas of bleeding. Tell a health care provider  about:  Any allergies you have.  All medicines you are taking, including vitamins, herbs, eye drops, creams, and over-the-counter medicines.  Any problems you or family members have had with anesthetic medicines.  Any blood disorders you have.  Any surgeries you have had.  Any medical conditions you have.  Any problems you have had passing stool. What are the risks? Generally, this is a safe procedure. However, problems may occur, including:  Bleeding.  A tear in the intestine.  A reaction to medicines given during the exam.  Infection (rare). What happens before the procedure? Eating and drinking restrictions  Follow instructions from your health care provider about eating and drinking, which may include:  A few days before the procedure - follow a low-fiber diet. Avoid nuts, seeds, dried fruit, raw fruits, and vegetables.  1-3 days before the procedure - follow a clear liquid diet. Drink only clear liquids, such as clear broth or bouillon, black coffee or tea, clear juice, clear soft drinks or sports drinks, gelatin desert, and popsicles. Avoid any liquids that contain red or purple dye.  On the day of the procedure - do not eat or drink anything during the 2 hours before the procedure, or within the time period that your health care provider recommends. Bowel prep  If you were prescribed an oral bowel prep to clean out your colon:  Take it as told by your health care provider. Starting  the day before your procedure, you will need to drink a large amount of medicated liquid. The liquid will cause you to have multiple loose stools until your stool is almost clear or light green.  If your skin or anus gets irritated from diarrhea, you may use these to relieve the irritation:  Medicated wipes, such as adult wet wipes with aloe and vitamin E.  A skin soothing-product like petroleum jelly.  If you vomit while drinking the bowel prep, take a break for up to 60 minutes and  then begin the bowel prep again. If vomiting continues and you cannot take the bowel prep without vomiting, call your health care provider. General instructions  Ask your health care provider about changing or stopping your regular medicines. This is especially important if you are taking diabetes medicines or blood thinners.  Plan to have someone take you home from the hospital or clinic. What happens during the procedure?  An IV tube may be inserted into one of your veins.  You will be given medicine to help you relax (sedative).  To reduce your risk of infection:  Your health care team will wash or sanitize their hands.  Your anal area will be washed with soap.  You will be asked to lie on your side with your knees bent.  Your health care provider will lubricate a long, thin, flexible tube. The tube will have a camera and a light on the end.  The tube will be inserted into your anus.  The tube will be gently eased through your rectum and colon.  Air will be delivered into your colon to keep it open. You may feel some pressure or cramping.  The camera will be used to take images during the procedure.  A small tissue sample may be removed from your body to be examined under a microscope (biopsy). If any potential problems are found, the tissue will be sent to a lab for testing.  If small polyps are found, your health care provider may remove them and have them checked for cancer cells.  The tube that was inserted into your anus will be slowly removed. The procedure may vary among health care providers and hospitals. What happens after the procedure?  Your blood pressure, heart rate, breathing rate, and blood oxygen level will be monitored until the medicines you were given have worn off.  Do not drive for 24 hours after the exam.  You may have a small amount of blood in your stool.  You may pass gas and have mild abdominal cramping or bloating due to the air that was used  to inflate your colon during the exam.  It is up to you to get the results of your procedure. Ask your health care provider, or the department performing the procedure, when your results will be ready. This information is not intended to replace advice given to you by your health care provider. Make sure you discuss any questions you have with your health care provider. Document Released: 01/16/2000 Document Revised: 08/08/2015 Document Reviewed: 04/01/2015 Elsevier Interactive Patient Education  2017 [employer]  Colonoscopy, Adult, Care After This sheet gives you information about how to care for yourself after your procedure. Your health care provider may also give you more specific instructions. If you have problems or questions, contact your health care provider. What can I expect after the procedure? After the procedure, it is common to have:  A small amount of blood in your stool for 24 hours  after the procedure.  Some gas.  Mild abdominal cramping or bloating. Follow these instructions at home: General instructions  For the first 24 hours after the procedure:  Do not drive or use machinery.  Do not sign important documents.  Do not drink alcohol.  Do your regular daily activities at a slower pace than normal.  Eat soft, easy-to-digest foods.  Rest often.  Take over-the-counter or prescription medicines only as told by your health care provider.  It is up to you to get the results of your procedure. Ask your health care provider, or the department performing the procedure, when your results will be ready. Relieving cramping and bloating  Try walking around when you have cramps or feel bloated.  Apply heat to your abdomen as told by your health care provider. Use a heat source that your health care provider recommends, such as a moist heat pack or a heating pad.  Place a towel between your skin and the heat source.  Leave the heat on for 20-30 minutes.  Remove  the heat if your skin turns bright red. This is especially important if you are unable to feel pain, heat, or cold. You may have a greater risk of getting burned. Eating and drinking  Drink enough fluid to keep your urine clear or pale yellow.  Resume your normal diet as instructed by your health care provider. Avoid heavy or fried foods that are hard to digest.  Avoid drinking alcohol for as long as instructed by your health care provider. Contact a health care provider if:  You have blood in your stool 2-3 days after the procedure. Get help right away if:  You have more than a small spotting of blood in your stool.  You pass large blood clots in your stool.  Your abdomen is swollen.  You have nausea or vomiting.  You have a fever.  You have increasing abdominal pain that is not relieved with medicine. This information is not intended to replace advice given to you by your health care provider. Make sure you discuss any questions you have with your health care provider. Document Released: 09/02/2003 Document Revised: 10/13/2015 Document Reviewed: 04/01/2015 Elsevier Interactive Patient Education  2017 [employer]  Monitored Anesthesia Care Anesthesia is a term that refers to techniques, procedures, and medicines that help a person stay safe and comfortable during a medical procedure. Monitored anesthesia care, or sedation, is one type of anesthesia. Your anesthesia specialist may recommend sedation if you will be having a procedure that does not require you to be unconscious, such as:  Cataract surgery.  A dental procedure.  A biopsy.  A colonoscopy. During the procedure, you may receive a medicine to help you relax (sedative). There are three levels of sedation:  Mild sedation. At this level, you may feel awake and relaxed. You will be able to follow directions.  Moderate sedation. At this level, you will be sleepy. You may not remember the procedure.  Deep sedation.  At this level, you will be asleep. You will not remember the procedure. The more medicine you are given, the deeper your level of sedation will be. Depending on how you respond to the procedure, the anesthesia specialist may change your level of sedation or the type of anesthesia to fit your needs. An anesthesia specialist will monitor you closely during the procedure. Let your health care provider know about:  Any allergies you have.  All medicines you are taking, including vitamins, herbs, eye drops, creams, and  over-the-counter medicines.  Any use of steroids (by mouth or as a cream).  Any problems you or family members have had with sedatives and anesthetic medicines.  Any blood disorders you have.  Any surgeries you have had.  Any medical conditions you have, such as sleep apnea.  Whether you are pregnant or may be pregnant.  Any use of cigarettes, alcohol, or street drugs. What are the risks? Generally, this is a safe procedure. However, problems may occur, including:  Getting too much medicine (oversedation).  Nausea.  Allergic reaction to medicines.  Trouble breathing. If this happens, a breathing tube may be used to help with breathing. It will be removed when you are awake and breathing on your own.  Heart trouble.  Lung trouble. Before the procedure Staying hydrated  Follow instructions from your health care provider about hydration, which may include:  Up to 2 hours before the procedure - you may continue to drink clear liquids, such as water, clear fruit juice, black coffee, and plain tea. Eating and drinking restrictions  Follow instructions from your health care provider about eating and drinking, which may include:  8 hours before the procedure - stop eating heavy meals or foods such as meat, fried foods, or fatty foods.  6 hours before the procedure - stop eating light meals or foods, such as toast or cereal.  6 hours before the procedure - stop  drinking milk or drinks that contain milk.  2 hours before the procedure - stop drinking clear liquids. Medicines  Ask your health care provider about:  Changing or stopping your regular medicines. This is especially important if you are taking diabetes medicines or blood thinners.  Taking medicines such as aspirin and ibuprofen. These medicines can thin your blood. Do not take these medicines before your procedure if your health care provider instructs you not to. Tests and exams  You will have a physical exam.  You may have blood tests done to show:  How well your kidneys and liver are working.  How well your blood can clot.  General instructions  Plan to have someone take you home from the hospital or clinic.  If you will be going home right after the procedure, plan to have someone with you for 24 hours. What happens during the procedure?  Your blood pressure, heart rate, breathing, level of pain and overall condition will be monitored.  An IV tube will be inserted into one of your veins.  Your anesthesia specialist will give you medicines as needed to keep you comfortable during the procedure. This may mean changing the level of sedation.  The procedure will be performed. After the procedure  Your blood pressure, heart rate, breathing rate, and blood oxygen level will be monitored until the medicines you were given have worn off.  Do not drive for 24 hours if you received a sedative.  You may:  Feel sleepy, clumsy, or nauseous.  Feel forgetful about what happened after the procedure.  Have a sore throat if you had a breathing tube during the procedure.  Vomit. This information is not intended to replace advice given to you by your health care provider. Make sure you discuss any questions you have with your health care provider. Document Released: 10/14/2004 Document Revised: 06/27/2015 Document Reviewed: 05/11/2015 Elsevier Interactive Patient Education  2017  [employer] Monitored Anesthesia Care, Care After These instructions provide you with information about caring for yourself after your procedure. Your health care provider may also give you  more specific instructions. Your treatment has been planned according to current medical practices, but problems sometimes occur. Call your health care provider if you have any problems or questions after your procedure. What can I expect after the procedure? After your procedure, it is common to:  Feel sleepy for several hours.  Feel clumsy and have poor balance for several hours.  Feel forgetful about what happened after the procedure.  Have poor judgment for several hours.  Feel nauseous or vomit.  Have a sore throat if you had a breathing tube during the procedure. Follow these instructions at home: For at least 24 hours after the procedure:   Do not:  Participate in activities in which you could fall or become injured.  Drive.  Use heavy machinery.  Drink alcohol.  Take sleeping pills or medicines that cause drowsiness.  Make important decisions or sign legal documents.  Take care of children on your own.  Rest. Eating and drinking  Follow the diet that is recommended by your health care provider.  If you vomit, drink water, juice, or soup when you can drink without vomiting.  Make sure you have little or no nausea before eating solid foods. General instructions  Have a responsible adult stay with you until you are awake and alert.  Take over-the-counter and prescription medicines only as told by your health care provider.  If you smoke, do not smoke without supervision.  Keep all follow-up visits as told by your health care provider. This is important. Contact a health care provider if:  You keep feeling nauseous or you keep vomiting.  You feel light-headed.  You develop a rash.  You have a fever. Get help right away if:  You have trouble breathing. This  information is not intended to replace advice given to you by your health care provider. Make sure you discuss any questions you have with your health care provider. Document Released: 05/11/2015 Document Revised: 09/10/2015 Document Reviewed: 05/11/2015 Elsevier Interactive Patient Education  2017 [employer].

## 2016-03-16 ENCOUNTER — Telehealth: Payer: Self-pay

## 2016-03-16 NOTE — Telephone Encounter (Signed)
Pt called to cancel colonoscopy for 03/23/2016 with Dr. Darrick PennaFields. Said she thinks she has the flu. I advised her to see PCP. She said she took the flu shot, and she is just going to take some OTC meds and lay around. I told her she had better see PCP right away if she feels she has flu. She has body aches and a family member just had flu.  She said she will see Dr. Lodema HongSimpson.  She will call when she feels better to reschedule.  Eber JonesCarolyn is aware in endo and pt has been taken off of the schedule.

## 2016-03-16 NOTE — Telephone Encounter (Signed)
PLEASE CALL PT. She CALL DR. Lodema HongSIMPSON IF SHE THINKS SHE HAS THE FLU.

## 2016-03-17 NOTE — Telephone Encounter (Signed)
LMOM for pt that Dr. Darrick PennaFields also said she should see Dr. Lodema HongSimpson if she feels she has flu.  I asked for a return call to let us know.

## 2016-03-18 ENCOUNTER — Encounter (HOSPITAL_COMMUNITY): Payer: Self-pay

## 2016-03-18 ENCOUNTER — Inpatient Hospital Stay (HOSPITAL_COMMUNITY)
Admission: RE | Admit: 2016-03-18 | Discharge: 2016-03-18 | Disposition: A | Discharge status: Home / Self Care | Payer: Self-pay | Source: Ambulatory Visit

## 2016-03-19 ENCOUNTER — Ambulatory Visit (INDEPENDENT_AMBULATORY_CARE_PROVIDER_SITE_OTHER): Payer: Medicare Other | Admitting: Psychiatry

## 2016-03-19 ENCOUNTER — Encounter (HOSPITAL_COMMUNITY): Payer: Self-pay | Admitting: Psychiatry

## 2016-03-19 VITALS — BP 96/66 | HR 75 | Ht 63.0 in | Wt 208.0 lb

## 2016-03-19 DIAGNOSIS — Z79899 Other long term (current) drug therapy: Secondary | ICD-10-CM

## 2016-03-19 DIAGNOSIS — Z888 Allergy status to other drugs, medicaments and biological substances status: Secondary | ICD-10-CM

## 2016-03-19 DIAGNOSIS — Z8041 Family history of malignant neoplasm of ovary: Secondary | ICD-10-CM

## 2016-03-19 DIAGNOSIS — F331 Major depressive disorder, recurrent, moderate: Secondary | ICD-10-CM

## 2016-03-19 DIAGNOSIS — Z8249 Family history of ischemic heart disease and other diseases of the circulatory system: Secondary | ICD-10-CM

## 2016-03-19 DIAGNOSIS — Z818 Family history of other mental and behavioral disorders: Secondary | ICD-10-CM

## 2016-03-19 DIAGNOSIS — Z833 Family history of diabetes mellitus: Secondary | ICD-10-CM

## 2016-03-19 DIAGNOSIS — Z811 Family history of alcohol abuse and dependence: Secondary | ICD-10-CM

## 2016-03-19 MED ORDER — ALPRAZOLAM 1 MG PO TABS: 1.0000 mg | ORAL_TABLET | Freq: Every day | ORAL | 2 refills | Status: DC

## 2016-03-19 MED ORDER — DULOXETINE HCL 60 MG PO CPEP: 60.0000 mg | ORAL_CAPSULE | Freq: Two times a day (BID) | ORAL | 2 refills | Status: DC
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
## 2016-03-23 ENCOUNTER — Encounter (HOSPITAL_COMMUNITY): Admission: RE | Payer: Self-pay | Source: Ambulatory Visit

## 2016-03-23 ENCOUNTER — Ambulatory Visit (HOSPITAL_COMMUNITY): Admission: RE | Admit: 2016-03-23 | Payer: Medicare Other | Source: Ambulatory Visit | Admitting: Gastroenterology

## 2016-03-23 SURGERY — COLONOSCOPY WITH PROPOFOL
Anesthesia: Monitor Anesthesia Care | Laterality: Bilateral

## 2016-04-01 ENCOUNTER — Other Ambulatory Visit: Payer: Self-pay | Admitting: Family Medicine

## 2016-04-08 ENCOUNTER — Other Ambulatory Visit: Payer: Self-pay | Admitting: *Deleted

## 2016-04-08 MED ORDER — PHENTERMINE HCL 37.5 MG PO TABS: 37.5000 mg | ORAL_TABLET | Freq: Every day | ORAL | 0 refills | Status: DC

## 2016-04-08 MED ORDER — ATORVASTATIN CALCIUM 20 MG PO TABS: ORAL_TABLET | ORAL | 1 refills | Status: DC

## 2016-05-03 ENCOUNTER — Telehealth: Payer: Self-pay

## 2016-05-03 NOTE — Telephone Encounter (Signed)
Pain med can only be given during an appt. Has appt on August 9th but this needs to be moved to tomorrow. Please change.

## 2016-05-03 NOTE — Telephone Encounter (Signed)
Pt is requesting rx for knee pain - oxycodone 10 mg -  Please call her at [phone]

## 2016-05-06 NOTE — Telephone Encounter (Signed)
Scheduled on 05/10/16 patient aware

## 2016-05-10 ENCOUNTER — Ambulatory Visit (INDEPENDENT_AMBULATORY_CARE_PROVIDER_SITE_OTHER): Payer: Medicare Other | Admitting: Family Medicine

## 2016-05-10 ENCOUNTER — Encounter: Payer: Self-pay | Admitting: Family Medicine

## 2016-05-10 VITALS — BP 120/82 | HR 80 | Temp 97.9°F | Resp 16 | Ht 63.0 in | Wt 215.0 lb

## 2016-05-10 DIAGNOSIS — E1169 Type 2 diabetes mellitus with other specified complication: Secondary | ICD-10-CM

## 2016-05-10 DIAGNOSIS — G8929 Other chronic pain: Secondary | ICD-10-CM | POA: Diagnosis not present

## 2016-05-10 DIAGNOSIS — E669 Obesity, unspecified: Secondary | ICD-10-CM | POA: Diagnosis not present

## 2016-05-10 DIAGNOSIS — E559 Vitamin D deficiency, unspecified: Secondary | ICD-10-CM

## 2016-05-10 DIAGNOSIS — E785 Hyperlipidemia, unspecified: Secondary | ICD-10-CM | POA: Diagnosis not present

## 2016-05-10 DIAGNOSIS — I10 Essential (primary) hypertension: Secondary | ICD-10-CM

## 2016-05-10 LAB — TSH: TSH: 1.06 mIU/L

## 2016-05-10 MED ORDER — OXYCODONE-ACETAMINOPHEN 10-325 MG PO TABS: ORAL_TABLET | ORAL | 0 refills | Status: DC

## 2016-05-10 NOTE — Patient Instructions (Addendum)
f/u week of June 25, call if you need me sooner  No changes in pain medication and stay on schedule   No phentermine , but keep the healthy eating in place and exercise  Weight loss challenge of 5 to 6 pounds  Labs today Lipid, cmp and EGFr, hBA1C, Vit D and TSH  Reschedule your colonoscopy please  Thank you  for choosing [facility]. We consider it a privelige to serve you.  Delivering excellent health care in a caring and  compassionate way is our goal.  Partnering with you,  so that together we can achieve this goal is our strategy.

## 2016-05-11 ENCOUNTER — Other Ambulatory Visit: Payer: Self-pay | Admitting: Family Medicine

## 2016-05-11 LAB — COMPLETE METABOLIC PANEL WITH GFR
ALT: 13 U/L (ref 6–29)
AST: 20 U/L (ref 10–35)
Albumin: 3.8 g/dL (ref 3.6–5.1)
Alkaline Phosphatase: 51 U/L (ref 33–130)
BUN: 27 mg/dL — ABNORMAL HIGH (ref 7–25)
CALCIUM: 9 mg/dL (ref 8.6–10.4)
CHLORIDE: 106 mmol/L (ref 98–110)
CO2: 23 mmol/L (ref 20–31)
CREATININE: 0.96 mg/dL (ref 0.50–1.05)
GFR, EST AFRICAN AMERICAN: 79 mL/min (ref >=60)
GFR, Est Non African American: 69 mL/min (ref >=60)
Glucose, Bld: 81 mg/dL (ref 65–99)
Potassium: 3.5 mmol/L (ref 3.5–5.3)
Sodium: 140 mmol/L (ref 135–146)
Total Bilirubin: 0.2 mg/dL (ref 0.2–1.2)
Total Protein: 7.8 g/dL (ref 6.1–8.1)

## 2016-05-11 LAB — HEMOGLOBIN A1C
HEMOGLOBIN A1C: 5.6 % (ref <5.7)
MEAN PLASMA GLUCOSE: 114 mg/dL

## 2016-05-11 LAB — LIPID PANEL
CHOL/HDL RATIO: 3.1 ratio (ref <5.0)
CHOLESTEROL: 146 mg/dL (ref <200)
HDL: 47 mg/dL — ABNORMAL LOW (ref >50)
LDL Cholesterol: 87 mg/dL (ref <100)
TRIGLYCERIDES: 59 mg/dL (ref <150)
VLDL: 12 mg/dL (ref <30)

## 2016-05-11 LAB — VITAMIN D 25 HYDROXY (VIT D DEFICIENCY, FRACTURES): VIT D 25 HYDROXY: 66 ng/mL (ref 30–100)

## 2016-05-15 NOTE — Assessment & Plan Note (Signed)
Improved, no more phentermine at this time Patient re-educated about  the importance of commitment to a  minimum of 150 minutes of exercise per week.  The importance of healthy food choices with portion control discussed. Encouraged to start a food diary, count calories and to consider  joining a support group. Sample diet sheets offered. Goals set by the patient for the next several months.   Weight /BMI 05/10/2016 01/19/2016 01/08/2016  WEIGHT 215 lb 0.6 oz 218 lb 215 lb  HEIGHT     BMI 38.09 kg/m2 38.62 kg/m2 36.9 kg/m2  Some encounter information is confidential and restricted. Go to Review Flowsheets activity to see all data.

## 2016-05-15 NOTE — Assessment & Plan Note (Signed)
Adequate control with no adverse s/e Registry is reviewed Twelve weeks of medication prescribed

## 2016-05-15 NOTE — Progress Notes (Signed)
   Tami Pearson     MRN: [id number]      DOB: 1966/01/13   HPI [name] is here for follow up of chronic pain  Reports adequate control and ability to function with some limitations. No adverse s/e reported  ROS See HPI . Denies uncontrolled  joint pain, swelling and limitation in mobility. Denies headaches, seizures, numbness, or tingling. Denies depression, anxiety or insomnia. Denies skin break down or rash.   PE  BP 120/82 (BP Location: Left Arm, Patient Position: Sitting, Cuff Size: Large)   Pulse 80   Temp 97.9 F (36.6 C) (Temporal)   Resp 16   Ht  (1.6 m)   Wt 215 lb 0.6 oz (97.5 kg)   LMP 03/18/2013   SpO2 96%   BMI 38.09 kg/m   Patient alert and oriented and in no cardiopulmonary distress.  HEENT: No facial asymmetry, EOMI,   oropharynx pink and moist.  Neck supple no JVD, no mass.  Chest: Clear to auscultation bilaterally.  CVS: S1, S2 no murmurs, no S3.Regular rate.  ABD: Soft non tender.   Ext: No edema  MS: Adequate though reduced  ROM spine,  hips and knees.  Skin: Intact, no ulcerations or rash noted.  Psych: Good eye contact, normal affect. Memory intact not anxious or depressed appearing.  CNS: CN 2-12 intact, power,  normal throughout.no focal deficits noted.   Assessment & Plan  Encounter for chronic pain management Adequate control with no adverse s/e Registry is reviewed Twelve weeks of medication prescribed  Essential hypertension Controlled, no change in medication DASH diet and commitment to daily physical activity for a minimum of 30 minutes discussed and encouraged, as a part of hypertension management. The importance of attaining a healthy weight is also discussed.  BP/Weight 05/10/2016 01/19/2016 01/08/2016 12/10/2015 12/05/2015 11/24/2015 11/11/2015  Systolic BP 120 129 122 98 108 118 104  Diastolic BP 82 85 84 76 78 73 74  Wt. (Lbs) 215.04 218 215 - 221 221 232.08  BMI 38.09 38.62 36.9 - 37.93 37.93 39.84  Some  encounter information is confidential and restricted. Go to Review Flowsheets activity to see all data.       Morbid obesity Improved, no more phentermine at this time Patient re-educated about  the importance of commitment to a  minimum of 150 minutes of exercise per week.  The importance of healthy food choices with portion control discussed. Encouraged to start a food diary, count calories and to consider  joining a support group. Sample diet sheets offered. Goals set by the patient for the next several months.   Weight /BMI 05/10/2016 01/19/2016 01/08/2016  WEIGHT 215 lb 0.6 oz 218 lb 215 lb  HEIGHT     BMI 38.09 kg/m2 38.62 kg/m2 36.9 kg/m2  Some encounter information is confidential and restricted. Go to Review Flowsheets activity to see all data.

## 2016-05-15 NOTE — Assessment & Plan Note (Signed)
Controlled, no change in medication DASH diet and commitment to daily physical activity for a minimum of 30 minutes discussed and encouraged, as a part of hypertension management. The importance of attaining a healthy weight is also discussed.  BP/Weight 05/10/2016 01/19/2016 01/08/2016 12/10/2015 12/05/2015 11/24/2015 11/11/2015  Systolic BP 120 129 122 98 108 118 104  Diastolic BP 82 85 84 76 78 73 74  Wt. (Lbs) 215.04 218 215 - 221 221 232.08  BMI 38.09 38.62 36.9 - 37.93 37.93 39.84  Some encounter information is confidential and restricted. Go to Review Flowsheets activity to see all data.

## 2016-06-16 ENCOUNTER — Encounter (HOSPITAL_COMMUNITY): Payer: Self-pay | Admitting: Psychiatry

## 2016-06-16 ENCOUNTER — Ambulatory Visit (INDEPENDENT_AMBULATORY_CARE_PROVIDER_SITE_OTHER): Payer: Medicare Other | Admitting: Psychiatry

## 2016-06-16 VITALS — BP 125/91 | HR 83 | Ht 63.0 in | Wt 207.6 lb

## 2016-06-16 DIAGNOSIS — Z811 Family history of alcohol abuse and dependence: Secondary | ICD-10-CM

## 2016-06-16 DIAGNOSIS — Z818 Family history of other mental and behavioral disorders: Secondary | ICD-10-CM | POA: Diagnosis not present

## 2016-06-16 DIAGNOSIS — Z79891 Long term (current) use of opiate analgesic: Secondary | ICD-10-CM | POA: Diagnosis not present

## 2016-06-16 DIAGNOSIS — Z79899 Other long term (current) drug therapy: Secondary | ICD-10-CM | POA: Diagnosis not present

## 2016-06-16 DIAGNOSIS — F331 Major depressive disorder, recurrent, moderate: Secondary | ICD-10-CM

## 2016-06-16 MED ORDER — ALPRAZOLAM 1 MG PO TABS: 1.0000 mg | ORAL_TABLET | Freq: Every day | ORAL | 2 refills | Status: DC

## 2016-06-16 MED ORDER — DULOXETINE HCL 60 MG PO CPEP: 60.0000 mg | ORAL_CAPSULE | Freq: Two times a day (BID) | ORAL | 2 refills | Status: DC
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
## 2016-07-16 ENCOUNTER — Other Ambulatory Visit: Payer: Self-pay | Admitting: Family Medicine

## 2016-07-16 DIAGNOSIS — M549 Dorsalgia, unspecified: Secondary | ICD-10-CM

## 2016-07-24 DIAGNOSIS — E119 Type 2 diabetes mellitus without complications: Secondary | ICD-10-CM | POA: Diagnosis not present

## 2016-07-24 DIAGNOSIS — Z79899 Other long term (current) drug therapy: Secondary | ICD-10-CM | POA: Diagnosis not present

## 2016-07-24 DIAGNOSIS — J209 Acute bronchitis, unspecified: Secondary | ICD-10-CM | POA: Diagnosis not present

## 2016-07-24 DIAGNOSIS — E7800 Pure hypercholesterolemia, unspecified: Secondary | ICD-10-CM | POA: Diagnosis not present

## 2016-07-24 DIAGNOSIS — I10 Essential (primary) hypertension: Secondary | ICD-10-CM | POA: Diagnosis not present

## 2016-07-24 DIAGNOSIS — F329 Major depressive disorder, single episode, unspecified: Secondary | ICD-10-CM | POA: Diagnosis not present

## 2016-07-27 ENCOUNTER — Encounter: Payer: Self-pay | Admitting: Family Medicine

## 2016-07-27 DIAGNOSIS — Z1231 Encounter for screening mammogram for malignant neoplasm of breast: Secondary | ICD-10-CM | POA: Diagnosis not present

## 2016-07-27 LAB — HM MAMMOGRAPHY: HM Mammogram: NORMAL (ref 0–4)

## 2016-07-29 ENCOUNTER — Encounter: Payer: Self-pay | Admitting: Family Medicine

## 2016-07-29 ENCOUNTER — Ambulatory Visit (INDEPENDENT_AMBULATORY_CARE_PROVIDER_SITE_OTHER): Payer: Medicare Other | Admitting: Family Medicine

## 2016-07-29 VITALS — BP 114/80 | HR 57 | Temp 98.0°F | Resp 16 | Ht 63.0 in | Wt 209.0 lb

## 2016-07-29 DIAGNOSIS — Z09 Encounter for follow-up examination after completed treatment for conditions other than malignant neoplasm: Secondary | ICD-10-CM

## 2016-07-29 DIAGNOSIS — M549 Dorsalgia, unspecified: Secondary | ICD-10-CM | POA: Diagnosis not present

## 2016-07-29 DIAGNOSIS — I10 Essential (primary) hypertension: Secondary | ICD-10-CM

## 2016-07-29 MED ORDER — OXYCODONE-ACETAMINOPHEN 10-325 MG PO TABS: ORAL_TABLET | ORAL | 0 refills | Status: DC

## 2016-07-29 MED ORDER — BENZONATATE 100 MG PO CAPS: 100.0000 mg | ORAL_CAPSULE | Freq: Two times a day (BID) | ORAL | 0 refills | Status: DC | PRN

## 2016-07-29 NOTE — Patient Instructions (Addendum)
Physical exam week of August 27 , call if you need me sooner    congrats on great weight loss , keep it up!!  Pls schedule your colonoscopy  Tessalon perles sent for cough, oK to take one sudafed one daily for next 3 to 5 days to reduce drainage and cough

## 2016-08-01 ENCOUNTER — Encounter: Payer: Self-pay | Admitting: Family Medicine

## 2016-08-01 DIAGNOSIS — Z09 Encounter for follow-up examination after completed treatment for conditions other than malignant neoplasm: Secondary | ICD-10-CM | POA: Insufficient documentation

## 2016-08-01 NOTE — Progress Notes (Signed)
   Rica Recordsngeline L Pearson     MRN: [id number]      DOB: 12/11/1965   HPI [name] is here with 1 week h/o cough and chest congestion, had yellow sputum production and intermittent chills. Treated in ED with Z pack and prednisone , still has some congestion Denies sinus pressure , ear pain or sore throat There are no specific complaints   ROS See HPI   Denies chest pains, palpitations and leg swelling Denies abdominal pain, nausea, vomiting,diarrhea or constipation.   Denies dysuria, frequency, hesitancy or incontinence. Denies uncontrolled  joint pain, swelling and limitation in mobility. Denies headaches, seizures, numbness, or tingling. Denies uncontrolled  depression, anxiety or insomnia. Denies skin break down or rash.   PE  BP 114/80   Pulse (!) 57   Temp 98 F (36.7 C) (Oral)   Resp 16   Ht 5\' 3"  (1.6 m)   Wt 209 lb (94.8 kg)   LMP 03/18/2013   SpO2 97%   BMI 37.02 kg/m   Patient alert and oriented and in no cardiopulmonary distress.  HEENT: No facial asymmetry, EOMI,   oropharynx pink and moist.  Neck supple no JVD, no mass.No sinus tenderness  Chest: Adequate air entry , bibasilar crackles, no wheezes  CVS: S1, S2 no murmurs, no S3.Regular rate.  ABD: Soft non tender.   Ext: No edema  MS: Adequate ROM spine, shoulders, hips and knees.  Skin: Intact, no ulcerations or rash noted.  Psych: Good eye contact, normal affect. Memory intact not anxious or depressed appearing.  CNS: CN 2-12 intact, power,  normal throughout.no focal deficits noted.   Assessment & Plan  Encounter for examination following treatment at hospital Improvement in acute bronchitis , however still has chest congestion, tessalon perle prescribed  Essential hypertension Controlled, no change in medication DASH diet and commitment to daily physical activity for a minimum of 30 minutes discussed and encouraged, as a part of hypertension management. The importance of attaining a  healthy weight is also discussed.  BP/Weight 07/29/2016 05/10/2016 01/19/2016 01/08/2016 12/10/2015 12/05/2015 11/24/2015  Systolic BP 114 120 129 122 98 108 118  Diastolic BP 80 82 85 84 76 78 73  Wt. (Lbs) 209 215.04 218 215 - 221 221  BMI 37.02 38.09 38.62 36.9 - 37.93 37.93  Some encounter information is confidential and restricted. Go to Review Flowsheets activity to see all data.       Morbid obesity Improved. Pt applauded on succesful weight loss through lifestyle change, and encouraged to continue same. Weight loss goal set for the next several months.   Back pain with radiation Controlled, no change in medication   Depression Increaased stress at home , however , treated by psychiatry and doing well

## 2016-08-01 NOTE — Assessment & Plan Note (Signed)
Increaased stress at home , however , treated by psychiatry and doing well

## 2016-08-01 NOTE — Assessment & Plan Note (Signed)
Improved. Pt applauded on succesful weight loss through lifestyle change, and encouraged to continue same. Weight loss goal set for the next several months.  

## 2016-08-01 NOTE — Assessment & Plan Note (Signed)
Controlled, no change in medication DASH diet and commitment to daily physical activity for a minimum of 30 minutes discussed and encouraged, as a part of hypertension management. The importance of attaining a healthy weight is also discussed.  BP/Weight 07/29/2016 05/10/2016 01/19/2016 01/08/2016 12/10/2015 12/05/2015 11/24/2015  Systolic BP 114 120 129 122 98 108 118  Diastolic BP 80 82 85 84 76 78 73  Wt. (Lbs) 209 215.04 218 215 - 221 221  BMI 37.02 38.09 38.62 36.9 - 37.93 37.93  Some encounter information is confidential and restricted. Go to Review Flowsheets activity to see all data.

## 2016-08-01 NOTE — Assessment & Plan Note (Signed)
Controlled, no change in medication  

## 2016-08-01 NOTE — Assessment & Plan Note (Signed)
Improvement in acute bronchitis , however still has chest congestion, tessalon perle prescribed

## 2016-08-03 ENCOUNTER — Ambulatory Visit (HOSPITAL_COMMUNITY): Payer: Self-pay | Admitting: Psychiatry

## 2016-08-09 ENCOUNTER — Telehealth (HOSPITAL_COMMUNITY): Payer: Self-pay | Admitting: *Deleted

## 2016-08-09 ENCOUNTER — Ambulatory Visit (HOSPITAL_COMMUNITY): Payer: Self-pay | Admitting: Psychiatry

## 2016-08-09 NOTE — Telephone Encounter (Signed)
Pt called on 08-08-2016 at 1:06pm and lm on voicemail stating she would not be in office 08-09-2016 due to being sick. Pt appt was cancelled.

## 2016-08-20 ENCOUNTER — Encounter (HOSPITAL_COMMUNITY): Payer: Self-pay | Admitting: Psychiatry

## 2016-08-20 ENCOUNTER — Ambulatory Visit (INDEPENDENT_AMBULATORY_CARE_PROVIDER_SITE_OTHER): Payer: Medicare Other | Admitting: Psychiatry

## 2016-08-20 VITALS — BP 141/91 | HR 62 | Ht 63.0 in | Wt 214.4 lb

## 2016-08-20 DIAGNOSIS — Z818 Family history of other mental and behavioral disorders: Secondary | ICD-10-CM

## 2016-08-20 DIAGNOSIS — F331 Major depressive disorder, recurrent, moderate: Secondary | ICD-10-CM

## 2016-08-20 DIAGNOSIS — Z811 Family history of alcohol abuse and dependence: Secondary | ICD-10-CM

## 2016-08-20 MED ORDER — ALPRAZOLAM 1 MG PO TABS: 1.0000 mg | ORAL_TABLET | Freq: Every day | ORAL | 2 refills | Status: DC

## 2016-08-20 MED ORDER — DULOXETINE HCL 60 MG PO CPEP: 60.0000 mg | ORAL_CAPSULE | Freq: Two times a day (BID) | ORAL | 2 refills | Status: DC
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
## 2016-09-03 ENCOUNTER — Other Ambulatory Visit: Payer: Self-pay | Admitting: Family Medicine

## 2016-09-03 DIAGNOSIS — I10 Essential (primary) hypertension: Secondary | ICD-10-CM

## 2016-09-15 ENCOUNTER — Ambulatory Visit (HOSPITAL_COMMUNITY): Payer: Medicare Other | Admitting: Licensed Clinical Social Worker

## 2016-09-27 ENCOUNTER — Other Ambulatory Visit (HOSPITAL_COMMUNITY)
Admission: RE | Admit: 2016-09-27 | Discharge: 2016-09-27 | Disposition: A | Discharge status: Home / Self Care | Payer: Medicare Other | Source: Ambulatory Visit | Attending: Family Medicine | Admitting: Family Medicine

## 2016-09-27 ENCOUNTER — Encounter: Payer: Self-pay | Admitting: Family Medicine

## 2016-09-27 ENCOUNTER — Ambulatory Visit (INDEPENDENT_AMBULATORY_CARE_PROVIDER_SITE_OTHER): Payer: Medicare Other | Admitting: Family Medicine

## 2016-09-27 VITALS — BP 122/80 | HR 84 | Resp 16 | Ht 63.0 in | Wt 214.0 lb

## 2016-09-27 DIAGNOSIS — Z01419 Encounter for gynecological examination (general) (routine) without abnormal findings: Secondary | ICD-10-CM

## 2016-09-27 DIAGNOSIS — Z1211 Encounter for screening for malignant neoplasm of colon: Secondary | ICD-10-CM | POA: Diagnosis not present

## 2016-09-27 DIAGNOSIS — Z124 Encounter for screening for malignant neoplasm of cervix: Secondary | ICD-10-CM | POA: Insufficient documentation

## 2016-09-27 DIAGNOSIS — Z23 Encounter for immunization: Secondary | ICD-10-CM | POA: Diagnosis not present

## 2016-09-27 DIAGNOSIS — Z0000 Encounter for general adult medical examination without abnormal findings: Secondary | ICD-10-CM

## 2016-09-27 DIAGNOSIS — G8929 Other chronic pain: Secondary | ICD-10-CM

## 2016-09-27 LAB — POC HEMOCCULT BLD/STL (OFFICE/1-CARD/DIAGNOSTIC): FECAL OCCULT BLD: NEGATIVE

## 2016-09-27 MED ORDER — OXYCODONE-ACETAMINOPHEN 10-325 MG PO TABS: ORAL_TABLET | ORAL | 0 refills | Status: DC

## 2016-09-27 NOTE — Patient Instructions (Addendum)
F/u first week in December 5 call if you need me sooner  Flu vaccine today  Reduced dose of pain medication to one twice daily starting today  Thank you  for choosing [facility]. We consider it a privelige to serve you.  Delivering excellent health care in a caring and  compassionate way is our goal.  Partnering with you,  so that together we can achieve this goal is our strategy.

## 2016-10-01 ENCOUNTER — Telehealth: Payer: Self-pay | Admitting: Family Medicine

## 2016-10-01 LAB — CYTOLOGY - PAP
Diagnosis: NEGATIVE
HPV: NOT DETECTED

## 2016-10-01 NOTE — Telephone Encounter (Signed)
-----   Message from Kerri PerchesMargaret E Simpson, MD sent at 10/01/2016  2:57 PM EDT ----- Please advise the patient that pap is normal.

## 2016-10-01 NOTE — Telephone Encounter (Signed)
Called patient regarding message below. No answer, left generic message for patient to return call.   

## 2016-10-03 DIAGNOSIS — G8929 Other chronic pain: Secondary | ICD-10-CM | POA: Insufficient documentation

## 2016-10-03 NOTE — Progress Notes (Signed)
    Tami Pearson     MRN: [id number]      DOB: 08/30/1965  HPI: Patient is in for annual physical exam. Pain management is also addressed and dose is reduced  Recent labs, if available are reviewed. Immunization is reviewed , and  updated    PE: BP 122/80   Pulse 84   Resp 16   Ht 5\' 3"  (1.6 m)   Wt 214 lb (97.1 kg)   LMP 03/18/2013   SpO2 97%   BMI 37.91 kg/m   Pleasant  female, alert and oriented x 3, in no cardio-pulmonary distress. Afebrile. HEENT No facial trauma or asymetry. Sinuses non tender.  Extra occullar muscles intact External ears normal, tympanic membranes clear. Oropharynx moist, no exudate. Neck: supple, no adenopathy,JVD or thyromegaly.No bruits.  Chest: Clear to ascultation bilaterally.No crackles or wheezes. Non tender to palpation  Breast: No asymetry,no masses or lumps. No tenderness. No nipple discharge or inversion. No axillary or supraclavicular adenopathy  Cardiovascular system; Heart sounds normal,  S1 and  S2 ,no S3.  No murmur, or thrill. Apical beat not displaced Peripheral pulses normal.  Abdomen: Soft, non tender, no organomegaly or masses. No bruits. Bowel sounds normal. No guarding, tenderness or rebound.  Rectal:  Normal sphincter tone. No rectal mass. Guaiac negative stool.  GU: External genitalia normal female genitalia , normal female distribution of hair. No lesions. Urethral meatus normal in size, no  Prolapse, no lesions visibly  Present. Bladder non tender. Vagina pink and moist , with no visible lesions , discharge present . Adequate pelvic support no  cystocele or rectocele noted Cervix pink and appears healthy, no lesions or ulcerations noted, no discharge noted from os Uterus normal size, no adnexal masses, no cervical motion or adnexal tenderness.   Musculoskeletal exam: Decreased ROM of spine, hips , shoulders and knees.  No muscle wasting or atrophy.   Neurologic: Cranial nerves 2 to 12  intact. Power, tone ,sensation and reflexes normal throughout. No disturbance in gait. No tremor.  Skin: Intact, no ulceration, erythema , scaling or rash noted. Pigmentation normal throughout  Psych; Normal mood and affect. Judgement and concentration normal   Assessment & Plan:  Annual physical exam Annual exam as documented.  Immunization and cancer screening needs are specifically addressed at this visit.   Encounter for chronic pain management The patient's Controlled Substance registry is reviewed and compliance confirmed. Adequacy of  Pain control and level of function is assessed. Medication dosing is adjusted and is reduced to twice daily dosing. Twelve weeks of medication is prescribed , patient signs for the script and is provided with a follow up appointment between 11 to 12 weeks .

## 2016-10-03 NOTE — Assessment & Plan Note (Signed)
The patient's Controlled Substance registry is reviewed and compliance confirmed. Adequacy of  Pain control and level of function is assessed. Medication dosing is adjusted and is reduced to twice daily dosing. Twelve weeks of medication is prescribed , patient signs for the script and is provided with a follow up appointment between 11 to 12 weeks .

## 2016-10-03 NOTE — Assessment & Plan Note (Addendum)
Annual exam as documented. . Immunization and cancer screening needs are specifically addressed at this visit.  

## 2016-10-08 ENCOUNTER — Ambulatory Visit (HOSPITAL_COMMUNITY): Payer: Self-pay | Admitting: Licensed Clinical Social Worker

## 2016-10-14 ENCOUNTER — Telehealth (HOSPITAL_COMMUNITY): Payer: Self-pay | Admitting: *Deleted

## 2016-10-14 NOTE — Telephone Encounter (Signed)
Spoke with pt pharmacy and informed Dianne with what provider stated and she verbalized understanding

## 2016-10-14 NOTE — Telephone Encounter (Signed)
Pt and her pharmacy called stating that per their new policy, provider have to inform them provider is aware of all of pt medications on her list. Per pt her pharmacy is refusing to refill her Xanax until they get that approval from Dr. Tenny Crawoss the she is aware of all of pt meds. Staff went over meds that's in pt chart with her and she verified she's taking all that's list. Pt number is [phone].

## 2016-10-14 NOTE — Telephone Encounter (Signed)
Yes I am aware of all meds. Obviously I review meds with each patient at their visit. Please inform pharmacy not to bother us with such requests

## 2016-10-19 ENCOUNTER — Telehealth: Payer: Self-pay | Admitting: Family Medicine

## 2016-10-19 NOTE — Telephone Encounter (Signed)
Patient is requesting another rx for phentermine Cb#: [phone] Pharm: [facility]

## 2016-10-20 NOTE — Telephone Encounter (Signed)
Called patient regarding message below. No answer, unable to leave message. Need to inform that Dr Lodema Hong is out until 10/1

## 2016-10-22 ENCOUNTER — Ambulatory Visit (HOSPITAL_COMMUNITY): Payer: Medicare Other | Admitting: Licensed Clinical Social Worker

## 2016-11-08 ENCOUNTER — Telehealth: Payer: Self-pay | Admitting: Family Medicine

## 2016-11-08 NOTE — Telephone Encounter (Signed)
Patient is requesting refill for phentermine  Cb# [id number]

## 2016-11-09 ENCOUNTER — Other Ambulatory Visit: Payer: Self-pay | Admitting: Family Medicine

## 2016-11-09 MED ORDER — PHENTERMINE HCL 37.5 MG PO TABS: 37.5000 mg | ORAL_TABLET | Freq: Every day | ORAL | 1 refills | Status: DC

## 2016-11-09 NOTE — Telephone Encounter (Signed)
Done

## 2016-11-09 NOTE — Telephone Encounter (Signed)
Two months of medication to be sent to [facility] she is aware, printed , signed and in your box, pls fax

## 2016-11-11 ENCOUNTER — Other Ambulatory Visit: Payer: Self-pay | Admitting: Family Medicine

## 2016-11-15 ENCOUNTER — Ambulatory Visit (HOSPITAL_COMMUNITY): Payer: Self-pay | Admitting: Psychiatry

## 2016-11-15 NOTE — Telephone Encounter (Signed)
Seen [phone] 

## 2016-11-16 ENCOUNTER — Telehealth (HOSPITAL_COMMUNITY): Payer: Self-pay | Admitting: *Deleted

## 2016-11-16 NOTE — Telephone Encounter (Signed)
ERROR

## 2016-11-29 ENCOUNTER — Ambulatory Visit (INDEPENDENT_AMBULATORY_CARE_PROVIDER_SITE_OTHER): Payer: Medicare Other | Admitting: Psychiatry

## 2016-11-29 ENCOUNTER — Encounter (HOSPITAL_COMMUNITY): Payer: Self-pay | Admitting: Psychiatry

## 2016-11-29 VITALS — BP 111/85 | HR 78 | Ht 63.0 in | Wt 210.8 lb

## 2016-11-29 DIAGNOSIS — Z818 Family history of other mental and behavioral disorders: Secondary | ICD-10-CM | POA: Diagnosis not present

## 2016-11-29 DIAGNOSIS — Z96649 Presence of unspecified artificial hip joint: Secondary | ICD-10-CM | POA: Diagnosis not present

## 2016-11-29 DIAGNOSIS — M2550 Pain in unspecified joint: Secondary | ICD-10-CM | POA: Diagnosis not present

## 2016-11-29 DIAGNOSIS — Z634 Disappearance and death of family member: Secondary | ICD-10-CM

## 2016-11-29 DIAGNOSIS — Z811 Family history of alcohol abuse and dependence: Secondary | ICD-10-CM | POA: Diagnosis not present

## 2016-11-29 DIAGNOSIS — M549 Dorsalgia, unspecified: Secondary | ICD-10-CM | POA: Diagnosis not present

## 2016-11-29 DIAGNOSIS — F419 Anxiety disorder, unspecified: Secondary | ICD-10-CM

## 2016-11-29 DIAGNOSIS — F331 Major depressive disorder, recurrent, moderate: Secondary | ICD-10-CM | POA: Diagnosis not present

## 2016-11-29 DIAGNOSIS — Z560 Unemployment, unspecified: Secondary | ICD-10-CM | POA: Diagnosis not present

## 2016-11-29 DIAGNOSIS — M170 Bilateral primary osteoarthritis of knee: Secondary | ICD-10-CM

## 2016-11-29 MED ORDER — ALPRAZOLAM 1 MG PO TABS
1.0000 mg | ORAL_TABLET | Freq: Every day | ORAL | 2 refills | Status: DC
Start: 2016-11-29 — End: 2017-03-01

## 2016-11-29 MED ORDER — DULOXETINE HCL 60 MG PO CPEP: 60.0000 mg | ORAL_CAPSULE | Freq: Two times a day (BID) | ORAL | 2 refills | Status: DC
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
## 2016-12-08 ENCOUNTER — Ambulatory Visit (HOSPITAL_COMMUNITY): Payer: Medicare Other | Admitting: Licensed Clinical Social Worker

## 2016-12-16 ENCOUNTER — Other Ambulatory Visit: Payer: Self-pay | Admitting: Family Medicine

## 2016-12-16 DIAGNOSIS — M549 Dorsalgia, unspecified: Secondary | ICD-10-CM

## 2016-12-16 NOTE — Telephone Encounter (Signed)
Seen [phone] 

## 2016-12-17 ENCOUNTER — Other Ambulatory Visit: Payer: Self-pay | Admitting: Family Medicine

## 2016-12-17 NOTE — Telephone Encounter (Signed)
Seen [phone] 

## 2017-01-06 ENCOUNTER — Encounter: Payer: Self-pay | Admitting: Family Medicine

## 2017-01-06 ENCOUNTER — Ambulatory Visit (INDEPENDENT_AMBULATORY_CARE_PROVIDER_SITE_OTHER): Payer: Medicare Other | Admitting: Family Medicine

## 2017-01-06 VITALS — BP 114/82 | HR 90 | Resp 16 | Ht 63.0 in | Wt 207.0 lb

## 2017-01-06 DIAGNOSIS — I10 Essential (primary) hypertension: Secondary | ICD-10-CM

## 2017-01-06 DIAGNOSIS — E785 Hyperlipidemia, unspecified: Secondary | ICD-10-CM | POA: Diagnosis not present

## 2017-01-06 DIAGNOSIS — M549 Dorsalgia, unspecified: Secondary | ICD-10-CM

## 2017-01-06 DIAGNOSIS — G8929 Other chronic pain: Secondary | ICD-10-CM | POA: Diagnosis not present

## 2017-01-06 DIAGNOSIS — E8881 Metabolic syndrome: Secondary | ICD-10-CM | POA: Diagnosis not present

## 2017-01-06 DIAGNOSIS — E669 Obesity, unspecified: Secondary | ICD-10-CM

## 2017-01-06 DIAGNOSIS — E1169 Type 2 diabetes mellitus with other specified complication: Secondary | ICD-10-CM

## 2017-01-06 MED ORDER — METHYLPREDNISOLONE ACETATE 80 MG/ML IJ SUSP
80.0000 mg | Freq: Once | INTRAMUSCULAR | Status: AC
  Administered 2017-01-06: 80 mg via INTRAMUSCULAR

## 2017-01-06 MED ORDER — KETOROLAC TROMETHAMINE 60 MG/2ML IM SOLN
60.0000 mg | Freq: Once | INTRAMUSCULAR | Status: AC
  Administered 2017-01-06: 60 mg via INTRAMUSCULAR

## 2017-01-06 MED ORDER — OXYCODONE-ACETAMINOPHEN 10-325 MG PO TABS: ORAL_TABLET | ORAL | 0 refills | Status: DC

## 2017-01-06 NOTE — Progress Notes (Signed)
Tami Pearson     MRN: [id number]      DOB: 10/30/1965   HPI Tami Pearson is here for follow up and re-evaluation of chronic medical conditions, medication management in particular chronic pain management  And obesity medication management, and review of any available recent lab and radiology data.  Preventive health is updated, specifically  Cancer screening and Immunization.   Questions or concerns regarding consultations or procedures which the PT has had in the interim are  Addressed.Still awaiting left knee surgery until she has lost sufficient weight The PT denies any adverse reactions to current medications since the last visit.  2 day h/o increased low back pain  radiating down left thigh to knee, no known trigger Awaiting left knee replacement when weight loss is achieved New onset hot flashes affecting face x 1 week Thirsty but blood sugar is normal ROS Denies recent fever or chills. Denies sinus pressure, nasal congestion, ear pain or sore throat. Denies chest congestion, productive cough or wheezing. Denies chest pains, palpitations and leg swelling Denies abdominal pain, nausea, vomiting,diarrhea or constipation.   Denies dysuria, frequency, hesitancy or incontinence.  Denies headaches, seizures, numbness, or tingling. Denies uncontrolled  depression, anxiety or insomnia. Denies skin break down or rash.   PE  BP 114/82   Pulse 90   Resp 16   Ht 5\' 3"  (1.6 m)   Wt 207 lb (93.9 kg)   LMP 03/18/2013   SpO2 97%   BMI 36.67 kg/m   Patient alert and oriented and in no cardiopulmonary distress.  HEENT: No facial asymmetry, EOMI,   oropharynx pink and moist.  Neck supple no JVD, no mass.  Chest: Clear to auscultation bilaterally.  CVS: S1, S2 no murmurs, no S3.Regular rate.  ABD: Soft non tender.   Ext: No edema  WU:[id number]:Decreased  ROM lumbar  Spine, adequate in  shoulders, hips and reduced in left  knee.  Skin: Intact, no ulcerations or rash noted.  Psych:  Good eye contact, normal affect. Memory intact not anxious or depressed appearing.  CNS: CN 2-12 intact, power,  normal throughout.no focal deficits noted.   Assessment & Plan  Back pain with radiation Uncontrolled.Toradol and depo medrol administered IM in the office ,  Morbid obesity Improved , continue phentermine as before Patient re-educated about  the importance of commitment to a  minimum of 150 minutes of exercise per week.  The importance of healthy food choices with portion control discussed. Encouraged to start a food diary, count calories and to consider  joining a support group. Sample diet sheets offered. Goals set by the patient for the next several months.   Weight /BMI 01/06/2017 09/27/2016 07/29/2016  WEIGHT 207 lb 214 lb 209 lb  HEIGHT 5\' 3"  5\' 3"  5\' 3"   BMI 36.67 kg/m2 37.91 kg/m2 37.02 kg/m2  Some encounter information is confidential and restricted. Go to Review Flowsheets activity to see all data.      Essential hypertension Controlled, no change in medication DASH diet and commitment to daily physical activity for a minimum of 30 minutes discussed and encouraged, as a part of hypertension management. The importance of attaining a healthy weight is also discussed.  BP/Weight 01/06/2017 09/27/2016 07/29/2016 05/10/2016 01/19/2016 01/08/2016 12/10/2015  Systolic BP 114 122 114 120 129 122 98  Diastolic BP 82 80 80 82 85 84 76  Wt. (Lbs) 207 214 209 215.04 218 215 -  BMI 36.67 37.91 37.02 38.09 38.62 36.9 -  Some encounter information is  confidential and restricted. Go to Review Flowsheets activity to see all data.       Hyperlipidemia LDL goal <100 Not at goal , needs to lower fatty food intake Hyperlipidemia:Low fat diet discussed and encouraged.   Lipid Panel  Lab Results  Component Value Date   CHOL 174 01/06/2017   HDL 56 01/06/2017   LDLCALC 87 05/10/2016   TRIG 86 01/06/2017   CHOLHDL 3.1 01/06/2017      No med change  Encounter for  chronic pain management The patient's Controlled Substance registry is reviewed and compliance confirmed. Adequacy of  Pain control and level of function is assessed. Medication dosing is adjusted as deemed appropriate. Twelve weeks of medication is prescribed , patient signs for the script and is provided with a follow up appointment between 11 to 12 weeks .   Diabetes mellitus type 2 in obese (HCC) Controlled, no change in medication Tami Pearson is reminded of the importance of commitment to daily physical activity for 30 minutes or more, as able and the need to limit carbohydrate intake to 30 to 60 grams per meal to help with blood sugar control.   The need to take medication as prescribed, test blood sugar as directed, and to call between visits if there is a concern that blood sugar is uncontrolled is also discussed.   Tami Pearson is reminded of the importance of daily foot exam, annual eye examination, and good blood sugar, blood pressure and cholesterol control.  Diabetic Labs Latest Ref Rng & Units 01/06/2017 05/10/2016 12/05/2015 07/30/2015 06/03/2015  HbA1c <5.7 % of total Hgb 5.8(H) 5.6 - 6.4(H) -  Microalbumin Not estab mg/dL - - - - 1.1  Micro/Creat Ratio <30 mcg/mg creat - - - - 4  Chol <200 mg/dL 161174 [id number] - - -  HDL >04>50 mg/dL 56 54(U47(L) - - -  Calc LDL <100 mg/dL - 87 - - -  Triglycerides <150 mg/dL 86 59 - - -  Creatinine 0.50 - 1.05 mg/dL 9.81(X1.12(H) 9.140.96 7.82(N1.01(H) 5.62(Z1.06(H) -   BP/Weight 01/06/2017 09/27/2016 07/29/2016 05/10/2016 01/19/2016 01/08/2016 12/10/2015  Systolic BP 114 122 114 120 129 122 98  Diastolic BP 82 80 80 82 85 84 76  Wt. (Lbs) 207 214 209 215.04 218 215 -  BMI 36.67 37.91 37.02 38.09 38.62 36.9 -  Some encounter information is confidential and restricted. Go to Review Flowsheets activity to see all data.   Foot/eye exam completion dates 06/03/2015 04/16/2015  Foot Form Completion Done Done        Metabolic syndrome X The increased risk of cardiovascular disease  associated with this diagnosis, and the need to consistently work on lifestyle to change this is discussed. Following  a  heart healthy diet ,commitment to 30 minutes of exercise at least 5 days per week, as well as control of blood sugar and cholesterol , and achieving a healthy weight are all the areas to be addressed .

## 2017-01-06 NOTE — Patient Instructions (Addendum)
F/U week of Feb 4, call if you need me sooner  No change in medication  congrats on weight loss , keep it  Up  Toradol 60 mg and depo medrol 80 mg in office today for back pain  Labs today, lipid, cmp and EGFr CBC and hBA1c  It is important that you exercise regularly at least 30 minutes 5 times a week. If you develop chest pain, have severe difficulty breathing, or feel very tired, stop exercising immediately and seek medical attention  Please work on good  health habits so that your health will improve. 1. Commitment to daily physical activity for 30 to 60  minutes, if you are able to do this.  2. Commitment to wise food choices. Aim for half of your  food intake to be vegetable and fruit, one quarter starchy foods, and one quarter protein. Try to eat on a regular schedule  3 meals per day, snacking between meals should be limited to vegetables or fruits or small portions of nuts. 64 ounces of water per day is generally recommended, unless you have specific health conditions, like heart failure or kidney failure where you will need to limit fluid intake.  3. Commitment to sufficient and a  good quality of physical and mental rest daily, generally between 6 to 8 hours per day.  WITH PERSISTANCE AND PERSEVERANCE, THE IMPOSSIBLE , BECOMES THE NORM!   Thank you  for choosing [facility]. We consider it a privelige to serve you.  Delivering excellent health care in a caring and  compassionate way is our goal.  Partnering with you,  so that together we can achieve this goal is our strategy.

## 2017-01-07 LAB — CBC
HEMATOCRIT: 38.8 % (ref 35.0–45.0)
HEMOGLOBIN: 13.1 g/dL (ref 11.7–15.5)
MCH: 29 pg (ref 27.0–33.0)
MCHC: 33.8 g/dL (ref 32.0–36.0)
MCV: 86 fL (ref 80.0–100.0)
MPV: 10.3 fL (ref 7.5–12.5)
Platelets: 328 10*3/uL (ref 140–400)
RBC: 4.51 10*6/uL (ref 3.80–5.10)
RDW: 12.7 % (ref 11.0–15.0)
WBC: 5.3 10*3/uL (ref 3.8–10.8)

## 2017-01-07 LAB — COMPLETE METABOLIC PANEL WITH GFR
AG Ratio: 0.9 (calc) — ABNORMAL LOW (ref 1.0–2.5)
ALKALINE PHOSPHATASE (APISO): 63 U/L (ref 33–130)
ALT: 50 U/L — ABNORMAL HIGH (ref 6–29)
AST: 39 U/L — AB (ref 10–35)
Albumin: 3.8 g/dL (ref 3.6–5.1)
BUN/Creatinine Ratio: 23 (calc) — ABNORMAL HIGH (ref 6–22)
BUN: 26 mg/dL — ABNORMAL HIGH (ref 7–25)
CO2: 27 mmol/L (ref 20–32)
CREATININE: 1.12 mg/dL — AB (ref 0.50–1.05)
Calcium: 10 mg/dL (ref 8.6–10.4)
Chloride: 104 mmol/L (ref 98–110)
GFR, Est African American: 65 mL/min/{1.73_m2} (ref >=60)
GFR, Est Non African American: 56 mL/min/{1.73_m2} — ABNORMAL LOW (ref >=60)
GLUCOSE: 91 mg/dL (ref 65–99)
Globulin: 4.3 g/dL (calc) — ABNORMAL HIGH (ref 1.9–3.7)
Potassium: 4 mmol/L (ref 3.5–5.3)
Sodium: 140 mmol/L (ref 135–146)
Total Bilirubin: 0.4 mg/dL (ref 0.2–1.2)
Total Protein: 8.1 g/dL (ref 6.1–8.1)

## 2017-01-07 LAB — LIPID PANEL
CHOL/HDL RATIO: 3.1 (calc) (ref <5.0)
Cholesterol: 174 mg/dL (ref <200)
HDL: 56 mg/dL (ref >50)
LDL CHOLESTEROL (CALC): 100 mg/dL — AB
Non-HDL Cholesterol (Calc): 118 mg/dL (calc) (ref <130)
Triglycerides: 86 mg/dL (ref <150)

## 2017-01-07 LAB — HEMOGLOBIN A1C
EAG (MMOL/L): 6.6 (calc)
Hgb A1c MFr Bld: 5.8 % of total Hgb — ABNORMAL HIGH (ref <5.7)
MEAN PLASMA GLUCOSE: 120 (calc)

## 2017-01-09 ENCOUNTER — Encounter: Payer: Self-pay | Admitting: Family Medicine

## 2017-01-09 NOTE — Assessment & Plan Note (Signed)
Controlled, no change in medication DASH diet and commitment to daily physical activity for a minimum of 30 minutes discussed and encouraged, as a part of hypertension management. The importance of attaining a healthy weight is also discussed.  BP/Weight 01/06/2017 09/27/2016 07/29/2016 05/10/2016 01/19/2016 01/08/2016 12/10/2015  Systolic BP 114 122 114 120 129 122 98  Diastolic BP 82 80 80 82 85 84 76  Wt. (Lbs) 207 214 209 215.04 218 215 -  BMI 36.67 37.91 37.02 38.09 38.62 36.9 -  Some encounter information is confidential and restricted. Go to Review Flowsheets activity to see all data.

## 2017-01-09 NOTE — Assessment & Plan Note (Signed)
Controlled, no change in medication Tami Pearson is reminded of the importance of commitment to daily physical activity for 30 minutes or more, as able and the need to limit carbohydrate intake to 30 to 60 grams per meal to help with blood sugar control.   The need to take medication as prescribed, test blood sugar as directed, and to call between visits if there is a concern that blood sugar is uncontrolled is also discussed.   Tami Pearson is reminded of the importance of daily foot exam, annual eye examination, and good blood sugar, blood pressure and cholesterol control.  Diabetic Labs Latest Ref Rng & Units 01/06/2017 05/10/2016 12/05/2015 07/30/2015 06/03/2015  HbA1c <5.7 % of total Hgb 5.8(H) 5.6 - 6.4(H) -  Microalbumin Not estab mg/dL - - - - 1.1  Micro/Creat Ratio <30 mcg/mg creat - - - - 4  Chol <200 mg/dL [id number] - - -  HDL >04>50 mg/dL 56 54(U47(L) - - -  Calc LDL <100 mg/dL - 87 - - -  Triglycerides <150 mg/dL 86 59 - - -  Creatinine 0.50 - 1.05 mg/dL 9.81(X1.12(H) 9.140.96 7.82(N1.01(H) 5.62(Z1.06(H) -   BP/Weight 01/06/2017 09/27/2016 07/29/2016 05/10/2016 01/19/2016 01/08/2016 12/10/2015  Systolic BP 114 122 114 120 129 122 98  Diastolic BP 82 80 80 82 85 84 76  Wt. (Lbs) 207 214 209 215.04 218 215 -  BMI 36.67 37.91 37.02 38.09 38.62 36.9 -  Some encounter information is confidential and restricted. Go to Review Flowsheets activity to see all data.   Foot/eye exam completion dates 06/03/2015 04/16/2015  Foot Form Completion Done Done

## 2017-01-09 NOTE — Assessment & Plan Note (Signed)
Improved , continue phentermine as before Patient re-educated about  the importance of commitment to a  minimum of 150 minutes of exercise per week.  The importance of healthy food choices with portion control discussed. Encouraged to start a food diary, count calories and to consider  joining a support group. Sample diet sheets offered. Goals set by the patient for the next several months.   Weight /BMI 01/06/2017 09/27/2016 07/29/2016  WEIGHT 207 lb 214 lb 209 lb  HEIGHT 5\' 3"  5\' 3"  5\' 3"   BMI 36.67 kg/m2 37.91 kg/m2 37.02 kg/m2  Some encounter information is confidential and restricted. Go to Review Flowsheets activity to see all data.

## 2017-01-09 NOTE — Assessment & Plan Note (Signed)
Uncontrolled.Toradol and depo medrol administered IM in the office , 

## 2017-01-09 NOTE — Assessment & Plan Note (Signed)
Not at goal , needs to lower fatty food intake Hyperlipidemia:Low fat diet discussed and encouraged.   Lipid Panel  Lab Results  Component Value Date   CHOL 174 01/06/2017   HDL 56 01/06/2017   LDLCALC 87 05/10/2016   TRIG 86 01/06/2017   CHOLHDL 3.1 01/06/2017      No med change

## 2017-01-09 NOTE — Assessment & Plan Note (Signed)
The patient's Controlled Substance registry is reviewed and compliance confirmed. Adequacy of  Pain control and level of function is assessed. Medication dosing is adjusted as deemed appropriate. Twelve weeks of medication is prescribed , patient signs for the script and is provided with a follow up appointment between 11 to 12 weeks .  

## 2017-01-09 NOTE — Assessment & Plan Note (Signed)
The increased risk of cardiovascular disease associated with this diagnosis, and the need to consistently work on lifestyle to change this is discussed. Following  a  heart healthy diet ,commitment to 30 minutes of exercise at least 5 days per week, as well as control of blood sugar and cholesterol , and achieving a healthy weight are all the areas to be addressed .  

## 2017-01-11 ENCOUNTER — Other Ambulatory Visit: Payer: Self-pay | Admitting: Family Medicine

## 2017-01-21 ENCOUNTER — Other Ambulatory Visit: Payer: Self-pay

## 2017-01-21 MED ORDER — PHENTERMINE HCL 37.5 MG PO TABS: 37.5000 mg | ORAL_TABLET | Freq: Every day | ORAL | 1 refills | Status: DC

## 2017-02-08 ENCOUNTER — Telehealth: Payer: Self-pay

## 2017-02-08 NOTE — Telephone Encounter (Signed)
Pt is requesting handicap form filled out

## 2017-02-08 NOTE — Telephone Encounter (Signed)
Pt qualifies , will sign and complete the reason after the basics are filled in just leave for me to complete please

## 2017-02-08 NOTE — Telephone Encounter (Signed)
To [employer]

## 2017-02-08 NOTE — Telephone Encounter (Signed)
Done

## 2017-03-01 ENCOUNTER — Ambulatory Visit (INDEPENDENT_AMBULATORY_CARE_PROVIDER_SITE_OTHER): Payer: Medicare Other | Admitting: Psychiatry

## 2017-03-01 ENCOUNTER — Encounter (HOSPITAL_COMMUNITY): Payer: Self-pay | Admitting: Psychiatry

## 2017-03-01 VITALS — BP 103/73 | HR 82 | Ht 63.0 in | Wt 208.0 lb

## 2017-03-01 DIAGNOSIS — Z818 Family history of other mental and behavioral disorders: Secondary | ICD-10-CM

## 2017-03-01 DIAGNOSIS — F419 Anxiety disorder, unspecified: Secondary | ICD-10-CM

## 2017-03-01 DIAGNOSIS — M2550 Pain in unspecified joint: Secondary | ICD-10-CM

## 2017-03-01 DIAGNOSIS — F331 Major depressive disorder, recurrent, moderate: Secondary | ICD-10-CM | POA: Diagnosis not present

## 2017-03-01 DIAGNOSIS — Z811 Family history of alcohol abuse and dependence: Secondary | ICD-10-CM

## 2017-03-01 DIAGNOSIS — Z560 Unemployment, unspecified: Secondary | ICD-10-CM | POA: Diagnosis not present

## 2017-03-01 DIAGNOSIS — M549 Dorsalgia, unspecified: Secondary | ICD-10-CM

## 2017-03-01 MED ORDER — ALPRAZOLAM 1 MG PO TABS: 1.0000 mg | ORAL_TABLET | Freq: Every day | ORAL | 2 refills | Status: DC

## 2017-03-01 MED ORDER — DULOXETINE HCL 60 MG PO CPEP: 60.0000 mg | ORAL_CAPSULE | Freq: Two times a day (BID) | ORAL | 2 refills | Status: DC
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
## 2017-03-07 ENCOUNTER — Encounter: Payer: Self-pay | Admitting: Family Medicine

## 2017-03-07 ENCOUNTER — Ambulatory Visit (INDEPENDENT_AMBULATORY_CARE_PROVIDER_SITE_OTHER): Payer: Medicare Other | Admitting: Family Medicine

## 2017-03-07 VITALS — BP 108/74 | HR 90 | Temp 98.0°F | Resp 16 | Ht 63.0 in | Wt 208.0 lb

## 2017-03-07 DIAGNOSIS — M549 Dorsalgia, unspecified: Secondary | ICD-10-CM

## 2017-03-07 DIAGNOSIS — I10 Essential (primary) hypertension: Secondary | ICD-10-CM

## 2017-03-07 DIAGNOSIS — M542 Cervicalgia: Secondary | ICD-10-CM

## 2017-03-07 DIAGNOSIS — K529 Noninfective gastroenteritis and colitis, unspecified: Secondary | ICD-10-CM | POA: Insufficient documentation

## 2017-03-07 DIAGNOSIS — M25512 Pain in left shoulder: Secondary | ICD-10-CM

## 2017-03-07 DIAGNOSIS — M25511 Pain in right shoulder: Secondary | ICD-10-CM | POA: Diagnosis not present

## 2017-03-07 HISTORY — DX: Cervicalgia: M54.2

## 2017-03-07 MED ORDER — HYOSCYAMINE SULFATE ER 0.375 MG PO TB12: 0.3750 mg | ORAL_TABLET | Freq: Two times a day (BID) | ORAL | 0 refills | Status: DC | PRN

## 2017-03-07 MED ORDER — ONDANSETRON HCL 4 MG PO TABS: 4.0000 mg | ORAL_TABLET | Freq: Three times a day (TID) | ORAL | 0 refills | Status: DC | PRN

## 2017-03-07 MED ORDER — PHENTERMINE HCL 37.5 MG PO TABS: 37.5000 mg | ORAL_TABLET | Freq: Every day | ORAL | 1 refills | Status: DC

## 2017-03-07 MED ORDER — DIPHENOXYLATE-ATROPINE 2.5-0.025 MG PO TABS: 1.0000 | ORAL_TABLET | Freq: Four times a day (QID) | ORAL | 0 refills | Status: AC | PRN

## 2017-03-07 NOTE — Progress Notes (Signed)
Fill Date ID Written Drug Qty Days Prescriber Rx # Pharmacy Refill Daily Dose * Pymt Type PMP  03/04/2017  1   01/21/2017  Phentermine 37.5 MG Tablet  30 30 Ma Sim  [id number] [id number]  1  Comm Ins  [place]  02/17/2017  1   11/29/2016  Alprazolam 1 MG Tablet  30 30 [place]  [id number] [facility] ([id number])  2 2.00 LME  Medicare  [place]  02/14/2017  1   01/06/2017  Oxycodone-Acetaminophen 10-325  60 30 Ma Sim  [id number] [facility] ([id number])  0 30.00 MME  Medicare  [place]  02/02/2017  1   01/21/2017  Phentermine 37.5 MG Tablet  30 30 Ma Sim  [id number] [facility] ([id number])  0  Comm Ins  [place]  01/17/2017  1   01/06/2017  Oxycodone-Acetaminophen 10-325  60 30 Ma Sim  [id number] [facility] ([id number])  0 30.00 MME  Medicare  [place]  01/11/2017  1   11/29/2016  Alprazolam 1 MG Tablet  30 30 [place]  [id number] [facility] ([id number])  1 2.00 LME  Medicare  [place]  12/16/2016  1   09/27/2016  Oxycodone-Acetaminophen 10-325             Reviewed at visit and direction changed to once daily effective 03/07/2017. No script provided at visit. Has 10 weeks of medication effeciotvely

## 2017-03-07 NOTE — Patient Instructions (Addendum)
F/u first week in May, call if you need me sooner  Zofran and toradol administered in office today for neck and shoulder pain and nausea  You are treated for acute gastroenteritis, zofran and lomotil and levsin are sent to your pharmacy  Labs today are CBCand diff and cmp and EGFR  PLEASE sTOP atorvastaitn  REduce percocet to ONE daily effective today  Continue half phentermine daily, weight loss goal of 8 pounds   Viral Gastroenteritis, Adult Viral gastroenteritis is also known as the stomach flu. This condition is caused by certain germs (viruses). These germs can be passed from person to person very easily (are very contagious). This condition can cause sudden watery poop (diarrhea), fever, and throwing up (vomiting). Having watery poop and throwing up can make you feel weak and cause you to get dehydrated. Dehydration can make you tired and thirsty, make you have a dry mouth, and make it so you pee (urinate) less often. Older adults and people with other diseases or a weak defense system (immune system) are at higher risk for dehydration. It is important to replace the fluids that you lose from having watery poop and throwing up. Follow these instructions at home: Follow instructions from your doctor about how to care for yourself at home. Eating and drinking  Follow these instructions as told by your doctor:  Take an oral rehydration solution (ORS). This is a drink that is sold at pharmacies and stores.  Drink clear fluids in small amounts as you are able, such as: ? Water. ? Ice chips. ? Diluted fruit juice. ? Low-calorie sports drinks.  Eat bland, easy-to-digest foods in small amounts as you are able, such as: ? Bananas. ? Applesauce. ? Rice. ? Low-fat (lean) meats. ? Toast. ? Crackers.  Avoid fluids that have a lot of sugar or caffeine in them.  Avoid alcohol.  Avoid spicy or fatty foods.  General instructions  Drink enough fluid to keep your pee (urine) clear  or pale yellow.  Wash your hands often. If you cannot use soap and water, use hand sanitizer.  Make sure that all people in your home wash their hands well and often.  Rest at home while you get better.  Take over-the-counter and prescription medicines only as told by your doctor.  Watch your condition for any changes.  Take a warm bath to help with any burning or pain from having watery poop.  Keep all follow-up visits as told by your doctor. This is important. Contact a doctor if:  You cannot keep fluids down.  Your symptoms get worse.  You have new symptoms.  You feel light-headed or dizzy.  You have muscle cramps. Get help right away if:  You have chest pain.  You feel very weak or you pass out (faint).  You see blood in your throw-up.  Your throw-up looks like coffee grounds.  You have bloody or black poop (stools) or poop that look like tar.  You have a very bad headache, a stiff neck, or both.  You have a rash.  You have very bad pain, cramping, or bloating in your belly (abdomen).  You have trouble breathing.  You are breathing very quickly.  Your heart is beating very quickly.  Your skin feels cold and clammy.  You feel confused.  You have pain when you pee.  You have signs of dehydration, such as: ? Dark pee, hardly any pee, or no pee. ? Cracked lips. ? Dry mouth. ? Sunken eyes. ?  Sleepiness. ? Weakness. This information is not intended to replace advice given to you by your health care provider. Make sure you discuss any questions you have with your health care provider. Document Released: 07/07/2007 Document Revised: 08/08/2015 Document Reviewed: 09/24/2014 Elsevier Interactive Patient Education  2017 [employer].

## 2017-03-08 ENCOUNTER — Encounter: Payer: Self-pay | Admitting: Family Medicine

## 2017-03-08 DIAGNOSIS — K529 Noninfective gastroenteritis and colitis, unspecified: Secondary | ICD-10-CM | POA: Diagnosis not present

## 2017-03-08 DIAGNOSIS — M542 Cervicalgia: Secondary | ICD-10-CM

## 2017-03-08 LAB — CBC WITH DIFFERENTIAL/PLATELET
Basophils Absolute: 62 cells/uL (ref 0–200)
Basophils Relative: 1.1 %
EOS PCT: 2.3 %
Eosinophils Absolute: 129 cells/uL (ref 15–500)
HCT: 41.1 % (ref 35.0–45.0)
Hemoglobin: 13.2 g/dL (ref 11.7–15.5)
Lymphs Abs: 1406 cells/uL (ref 850–3900)
MCH: 27.3 pg (ref 27.0–33.0)
MCHC: 32.1 g/dL (ref 32.0–36.0)
MCV: 84.9 fL (ref 80.0–100.0)
MPV: 9.4 fL (ref 7.5–12.5)
Monocytes Relative: 8.5 %
NEUTROS PCT: 63 %
Neutro Abs: 3528 cells/uL (ref 1500–7800)
Platelets: 380 10*3/uL (ref 140–400)
RBC: 4.84 10*6/uL (ref 3.80–5.10)
RDW: 12.7 % (ref 11.0–15.0)
Total Lymphocyte: 25.1 %
WBC mixed population: 476 cells/uL (ref 200–950)
WBC: 5.6 10*3/uL (ref 3.8–10.8)

## 2017-03-08 LAB — COMPLETE METABOLIC PANEL WITH GFR
AG RATIO: 0.9 (calc) — AB (ref 1.0–2.5)
ALKALINE PHOSPHATASE (APISO): 72 U/L (ref 33–130)
ALT: 29 U/L (ref 6–29)
AST: 28 U/L (ref 10–35)
Albumin: 3.9 g/dL (ref 3.6–5.1)
BUN/Creatinine Ratio: 19 (calc) (ref 6–22)
BUN: 20 mg/dL (ref 7–25)
CALCIUM: 9.9 mg/dL (ref 8.6–10.4)
CO2: 30 mmol/L (ref 20–32)
Chloride: 103 mmol/L (ref 98–110)
Creat: 1.07 mg/dL — ABNORMAL HIGH (ref 0.50–1.05)
GFR, EST NON AFRICAN AMERICAN: 60 mL/min/{1.73_m2} (ref >=60)
GFR, Est African American: 69 mL/min/{1.73_m2} (ref >=60)
Globulin: 4.2 g/dL (calc) — ABNORMAL HIGH (ref 1.9–3.7)
Glucose, Bld: 96 mg/dL (ref 65–139)
POTASSIUM: 3.7 mmol/L (ref 3.5–5.3)
Sodium: 140 mmol/L (ref 135–146)
Total Bilirubin: 0.3 mg/dL (ref 0.2–1.2)
Total Protein: 8.1 g/dL (ref 6.1–8.1)

## 2017-03-08 MED ORDER — KETOROLAC TROMETHAMINE 60 MG/2ML IM SOLN
60.0000 mg | Freq: Once | INTRAMUSCULAR | Status: AC
  Administered 2017-03-08: 60 mg via INTRAMUSCULAR

## 2017-03-08 MED ORDER — ONDANSETRON HCL 4 MG/2ML IJ SOLN
4.0000 mg | Freq: Once | INTRAMUSCULAR | Status: AC
  Administered 2017-03-08: 4 mg via INTRAMUSCULAR

## 2017-03-10 ENCOUNTER — Encounter: Payer: Self-pay | Admitting: Family Medicine

## 2017-03-12 DIAGNOSIS — M25511 Pain in right shoulder: Secondary | ICD-10-CM

## 2017-03-12 DIAGNOSIS — M25512 Pain in left shoulder: Secondary | ICD-10-CM

## 2017-03-12 HISTORY — DX: Pain in right shoulder: M25.511

## 2017-03-12 NOTE — Assessment & Plan Note (Signed)
Increased and uncontrolled, toradol 60 mg IM in office  ?

## 2017-03-12 NOTE — Assessment & Plan Note (Signed)
Deteriorated. Patient re-educated about  the importance of commitment to a  minimum of 150 minutes of exercise per week.  The importance of healthy food choices with portion control discussed. Encouraged to start a food diary, count calories and to consider  joining a support group. Sample diet sheets offered. Goals set by the patient for the next several months.   Weight /BMI 03/07/2017 01/06/2017 09/27/2016  WEIGHT 208 lb 207 lb 214 lb  HEIGHT 5\' 3"  5\' 3"  5\' 3"   BMI 36.85 kg/m2 36.67 kg/m2 37.91 kg/m2  Some encounter information is confidential and restricted. Go to Review Flowsheets activity to see all data.   Continue phentermine as before

## 2017-03-12 NOTE — Assessment & Plan Note (Signed)
Zofran IM in office followed by tablets and pt educated re need to stay hydrated and hand washing

## 2017-03-12 NOTE — Assessment & Plan Note (Signed)
Decreased with weight loss, dose of narcotic medication reduced by 50 %

## 2017-03-12 NOTE — Progress Notes (Signed)
   Tami Pearson     MRN: [id number]      DOB: 08/26/1965   HPI Tami Pearson is here with a 3 day h/o generalized body aches, sore throat , chills and low grade fever.C/O increased neck and shoulder pain C/o nausea, vomit and loose watery stool, no other family member is affected. C/o abdominal cramping pain Chronic back pain is less and weight loss effort is progressing well. Her need for pain medication is reduced and taper is to continue as chronic narcotic use is concerned, her liver enz are also elkevated  ROS See HPI  Denies chest congestion, productive cough or wheezing. Denies chest pains, palpitations body aches and sore throatand leg swelling  Denies dysuria, frequency, hesitancy or incontinence. Denies joint pain, swelling and limitation in mobility. Denies headaches, seizures, numbness, or tingling. Denies uncontrolled depression, anxiety or insomnia. Denies skin break down or rash.   PE  BP 108/74   Pulse 90   Temp 98 F (36.7 C) (Oral)   Resp 16   Ht 5\' 3"  (1.6 m)   Wt 208 lb (94.3 kg)   LMP 03/18/2013   SpO2 93%   BMI 36.85 kg/m   Patient alert and oriented and in no cardiopulmonary distress.  HEENT: No facial asymmetry, EOMI,   oropharynx pink and moist.  Neck supple no JVD, no mass.  Chest: Clear to auscultation bilaterally.  CVS: S1, S2 no murmurs, no S3.Regular rate.  ABD: Soft non tender.   Ext: No edema  MS: decreased ROM spine, , adequate in  hips and knees. Markedly decreased in shouldersSkin: Intact, no ulcerations or rash noted.  Psych: Good eye contact, normal affect. Memory intact not anxious or depressed appearing.  CNS: CN 2-12 intact, power,  normal throughout.no focal deficits noted.   Assessment & Plan Acute gastroenteritis Zofran IM in office followed by tablets and pt educated re need to stay hydrated and hand washing  Back pain with radiation Decreased with weight loss, dose of narcotic medication reduced by 50  %  Essential hypertension Controlled, no change in medication DASH diet and commitment to daily physical activity for a minimum of 30 minutes discussed and encouraged, as a part of hypertension management. The importance of attaining a healthy weight is also discussed.  BP/Weight 03/07/2017 01/06/2017 09/27/2016 07/29/2016 05/10/2016 01/19/2016 01/08/2016  Systolic BP 108 114 122 114 120 129 122  Diastolic BP 74 82 80 80 82 85 84  Wt. (Lbs) 208 207 214 209 215.04 218 215  BMI 36.85 36.67 37.91 37.02 38.09 38.62 36.9  Some encounter information is confidential and restricted. Go to Review Flowsheets activity to see all data.       Bilateral shoulder pain Increased and uncontrolled, toradol 60 mg IM in office  Morbid obesity Deteriorated. Patient re-educated about  the importance of commitment to a  minimum of 150 minutes of exercise per week.  The importance of healthy food choices with portion control discussed. Encouraged to start a food diary, count calories and to consider  joining a support group. Sample diet sheets offered. Goals set by the patient for the next several months.   Weight /BMI 03/07/2017 01/06/2017 09/27/2016  WEIGHT 208 lb 207 lb 214 lb  HEIGHT 5\' 3"  5\' 3"  5\' 3"   BMI 36.85 kg/m2 36.67 kg/m2 37.91 kg/m2  Some encounter information is confidential and restricted. Go to Review Flowsheets activity to see all data.   Continue phentermine as before

## 2017-03-12 NOTE — Assessment & Plan Note (Signed)
Controlled, no change in medication DASH diet and commitment to daily physical activity for a minimum of 30 minutes discussed and encouraged, as a part of hypertension management. The importance of attaining a healthy weight is also discussed.  BP/Weight 03/07/2017 01/06/2017 09/27/2016 07/29/2016 05/10/2016 01/19/2016 01/08/2016  Systolic BP 108 114 122 114 120 129 122  Diastolic BP 74 82 80 80 82 85 84  Wt. (Lbs) 208 207 214 209 215.04 218 215  BMI 36.85 36.67 37.91 37.02 38.09 38.62 36.9  Some encounter information is confidential and restricted. Go to Review Flowsheets activity to see all data.

## 2017-03-29 ENCOUNTER — Other Ambulatory Visit: Payer: Self-pay | Admitting: Family Medicine

## 2017-03-29 ENCOUNTER — Telehealth: Payer: Self-pay | Admitting: Family Medicine

## 2017-03-29 ENCOUNTER — Ambulatory Visit (HOSPITAL_COMMUNITY): Payer: Medicare Other | Admitting: Licensed Clinical Social Worker

## 2017-03-29 MED ORDER — CEPHALEXIN 500 MG PO CAPS: 500.0000 mg | ORAL_CAPSULE | Freq: Four times a day (QID) | ORAL | 0 refills | Status: DC

## 2017-03-29 MED ORDER — FLUCONAZOLE 150 MG PO TABS: ORAL_TABLET | ORAL | 0 refills | Status: DC

## 2017-03-29 NOTE — Telephone Encounter (Signed)
Pt has a Tooth Ache and wants an antibotic..Marland Kitchen

## 2017-03-29 NOTE — Telephone Encounter (Signed)
Meds sent and pt advised to see dentist asap

## 2017-04-21 ENCOUNTER — Other Ambulatory Visit: Payer: Self-pay | Admitting: Family Medicine

## 2017-04-21 DIAGNOSIS — I10 Essential (primary) hypertension: Secondary | ICD-10-CM

## 2017-04-21 DIAGNOSIS — M549 Dorsalgia, unspecified: Secondary | ICD-10-CM

## 2017-04-26 ENCOUNTER — Ambulatory Visit (HOSPITAL_COMMUNITY): Payer: Medicare Other | Admitting: Licensed Clinical Social Worker

## 2017-05-11 ENCOUNTER — Other Ambulatory Visit: Payer: Self-pay | Admitting: Family Medicine

## 2017-05-24 ENCOUNTER — Ambulatory Visit (HOSPITAL_COMMUNITY): Payer: Medicare Other | Admitting: Licensed Clinical Social Worker

## 2017-05-31 ENCOUNTER — Other Ambulatory Visit: Payer: Self-pay | Admitting: Family Medicine

## 2017-05-31 ENCOUNTER — Ambulatory Visit (INDEPENDENT_AMBULATORY_CARE_PROVIDER_SITE_OTHER): Payer: Medicare Other | Admitting: Psychiatry

## 2017-05-31 ENCOUNTER — Encounter (HOSPITAL_COMMUNITY): Payer: Self-pay | Admitting: Psychiatry

## 2017-05-31 VITALS — BP 133/84 | HR 76 | Ht 63.0 in | Wt 206.0 lb

## 2017-05-31 DIAGNOSIS — F331 Major depressive disorder, recurrent, moderate: Secondary | ICD-10-CM | POA: Diagnosis not present

## 2017-05-31 MED ORDER — DULOXETINE HCL 60 MG PO CPEP: 60.0000 mg | ORAL_CAPSULE | Freq: Two times a day (BID) | ORAL | 2 refills | Status: DC

## 2017-05-31 MED ORDER — ALPRAZOLAM 1 MG PO TABS: 1.0000 mg | ORAL_TABLET | Freq: Every day | ORAL | 2 refills | Status: DC
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
## 2017-05-31 NOTE — Telephone Encounter (Signed)
Moved out patients appt per R/S list, she states she is on pain medication management by Dr.Simpson, is this ok ? Can she get enough to last her to her next appt ?

## 2017-05-31 NOTE — Telephone Encounter (Signed)
Fill Date ID Written Drug Qty Days Prescriber Rx # Pharmacy Refill Daily Dose * Pymt Type PMP  05/28/2017  1  03/07/2017  Phentermine 37.5 Mg Tablet  30 30 Ma Sim  [id number]  [facility] ([id number])  1  Private Pay  [place]  05/23/2017  1  03/01/2017  Alprazolam 1 Mg Tablet  30 30 [place]  [id number]  [facility] ([id number])  0 2.00 LME Medicare  [place]  04/15/2017  1  03/01/2017  Alprazolam 1 Mg Tablet  30 30 [place]  [id number]  [facility] ([id number])  1 2.00 LME Medicare  [place]  04/10/2017  1  03/07/2017  Phentermine 37.5 Mg Tablet  30 30 Ma Sim  [id number]  [facility] ([id number])  0  Private Pay  [place]  03/18/2017  1  03/01/2017  Alprazolam 1 Mg Tablet  30 30 [place]  [id number]  [facility] ([id number])  0 2.00 LME Medicare  [place]  03/16/2017  1  01/06/2017  Oxycodone-Acetaminophen 10-325  60 30 Ma Sim  [id number]  [facility] ([id number])  0 30.00 MME Medicare  [place]  03/07/2017  1  03/07/2017  Diphenoxylate-Atrop 2.5-0.025  20 5 Ma Sim  [id number]  [facility] ([id number])  0  Medicare  [place]  03/04/2017  1  01/21/2017  Phentermine 37.5 Mg Tablet  30 30 Ma Sim  [id number]  [facility] ([id number])  1  Comm Ins  [place]  02/17/2017  1  11/29/2016  Alprazolam 1 Mg Tablet  30 30 [place]  [id number]  [facility] ([id number])  2 2.00 LME Medicare  [place]  02/14/2017  1  01/06/2017  Oxycodone-Acetaminophen 10-325  60 30 Ma Sim  [id number]  [facility] ([id number])  0 30.00 MME Medicare  [place]  02/02/2017  1  01/21/2017  Phentermine 37.5 Mg Tablet  30 30 Ma Sim  [id number]  [facility] ([id number])  0  Comm Ins  [place]  01/17/2017  1  01/06/2017  Oxycodone-Acetaminophen 10-325  60 30 Ma Sim  [id number]  [facility] ([id number])  0 30.00 MME Medicare  [place]  01/11/2017  1  11/29/2016  Alprazolam 1 Mg Tablet  30 30 [place]  [id number]  [facility] (1262)  1 2.00 LME Medicare  [place]  12/16/2016  1  09/27/2016  Oxycodone-Acetaminophen 10-325  60 30 Ma Sim  [id number]  [facility] ([id number])  0 30.00 MME Medicare  [place]  12/13/2016  1  11/09/2016  Phentermine 37.5 Mg Tablet  30           Needs 30 day supply sent in until she can get rescheduled

## 2017-06-01 ENCOUNTER — Ambulatory Visit: Payer: Self-pay | Admitting: Family Medicine

## 2017-06-01 ENCOUNTER — Other Ambulatory Visit: Payer: Self-pay

## 2017-06-01 ENCOUNTER — Telehealth: Payer: Self-pay | Admitting: Family Medicine

## 2017-06-01 MED ORDER — OXYCODONE-ACETAMINOPHEN 10-325 MG PO TABS: ORAL_TABLET | ORAL | 0 refills | Status: DC

## 2017-06-01 NOTE — Telephone Encounter (Signed)
Verbally gave the diagnosis code

## 2017-06-01 NOTE — Telephone Encounter (Signed)
Please just put in and send to me as a refill request. Janine Limbo. Tracie Harrier, MD

## 2017-06-01 NOTE — Telephone Encounter (Signed)
[facility] [place], left message on nurse line regarding patient's Percocet prescription. She states they are needing a diagnosis code on it.   Callback# [phone]

## 2017-06-01 NOTE — Telephone Encounter (Signed)
Her last fill was 03/16/17. I called the patient back and made her aware that I was forwarding her refill request to you. Thank you

## 2017-06-21 ENCOUNTER — Ambulatory Visit: Payer: Self-pay | Admitting: Family Medicine

## 2017-06-29 ENCOUNTER — Ambulatory Visit (HOSPITAL_COMMUNITY): Payer: Medicare Other | Admitting: Licensed Clinical Social Worker

## 2017-07-14 ENCOUNTER — Ambulatory Visit (INDEPENDENT_AMBULATORY_CARE_PROVIDER_SITE_OTHER): Payer: Medicare Other | Admitting: Family Medicine

## 2017-07-14 ENCOUNTER — Encounter: Payer: Self-pay | Admitting: Family Medicine

## 2017-07-14 VITALS — BP 124/84 | HR 88 | Resp 16 | Ht 63.0 in | Wt 218.0 lb

## 2017-07-14 DIAGNOSIS — G8929 Other chronic pain: Secondary | ICD-10-CM | POA: Diagnosis not present

## 2017-07-14 DIAGNOSIS — I10 Essential (primary) hypertension: Secondary | ICD-10-CM | POA: Diagnosis not present

## 2017-07-14 DIAGNOSIS — M549 Dorsalgia, unspecified: Secondary | ICD-10-CM | POA: Diagnosis not present

## 2017-07-14 MED ORDER — OXYCODONE-ACETAMINOPHEN 10-325 MG PO TABS: ORAL_TABLET | ORAL | 0 refills | Status: AC

## 2017-07-14 MED ORDER — PHENTERMINE HCL 37.5 MG PO TABS: 37.5000 mg | ORAL_TABLET | Freq: Every day | ORAL | 2 refills | Status: DC

## 2017-07-14 NOTE — Patient Instructions (Addendum)
Wellness with nurse is past due please schedule  Please schedule mammogram Patient )  Please schedule and get colonoscopy done (patient)  Weight loss goal of 12 pounds  Please work on good  health habits so that your health will improve. 1. Commitment to daily physical activity for 30 to 60  minutes, if you are able to do this.  2. Commitment to wise food choices. Aim for half of your  food intake to be vegetable and fruit, one quarter starchy foods, and one quarter protein. Try to eat on a regular schedule  3 meals per day, snacking between meals should be limited to vegetables or fruits or small portions of nuts. 64 ounces of water per day is generally recommended, unless you have specific health conditions, like heart failure or kidney failure where you will need to limit fluid intake.  3. Commitment to sufficient and a  good quality of physical and mental rest daily, generally between 6 to 8 hours per day.  WITH PERSISTANCE AND PERSEVERANCE, THE IMPOSSIBLE , BECOMES THE NORM! It is important that you exercise regularly at least 30 minutes 5 times a week. If you develop chest pain, have severe difficulty breathing, or feel very tired, stop exercising immediately and seek medical attention    Change in medication as discussed  Thank you  for choosing [facility]. We consider it a privelige to serve you.  Delivering excellent health care in a caring and  compassionate way is our goal.  Partnering with you,  so that together we can achieve this goal is our strategy.

## 2017-07-14 NOTE — Progress Notes (Signed)
Fill Date ID Written Drug Qty Days Prescriber Rx # Pharmacy Refill Daily Dose * Pymt Type PMP  06/20/2017  1  05/31/2017  Alprazolam 1 Mg Tablet  30 30 [place]  [id number]  [facility] ([id number])  0 2.00 LME Medicare  [place]  06/01/2017  1  06/01/2017  Oxycodone-Acetaminophen 10-325  30 30 [place]  [id number]  [facility] ([id number])  0 15.00 MME Medicare  [place]  05/28/2017  1  03/07/2017  Phentermine 37.5 Mg Tablet  30 30 Ma Sim  [id number]  Wal (1262)  1  Private Pay  [place]  05/23/2017  1  03/01/2017  Alprazolam 1 Mg Tablet  30 30 [place]  [id number]  [facility] (1248)  0 2.00 LME Medicare  [place]  04/15/2017  1  03/01/2017  Alprazolam 1 Mg Tablet  30 30 [place]  [id number]  Wal (1262)  1 2.00 LME Medicare  [place]  04/10/2017  1  03/07/2017  Phentermine 37.5 Mg Tablet  30 30 Ma Sim  [id number]  Wal (1262)  0  Private Pay  [place]  03/18/2017  1  03/01/2017  Alprazolam 1 Mg Tablet  30 30 [place]  [id number]  Wal (1262)  0 2.00 LME Medicare  [place]  03/16/2017  1  01/06/2017  Oxycodone-Acetaminophen 10-325  60 30 Ma Sim  [id number]  Wal (1262)  0 30.00 MME Medicare  [place]  03/07/2017  1  03/07/2017  Diphenoxylate-Atrop 2.5-0.025  20 5 Ma Sim  [id number]  Wal ([id number])  0  Medicare  [place]  03/04/2017  1  01/21/2017  Phentermine 37.5 Mg Tablet  30 30 Ma Sim  [id number]  [facility] ([id number])  1  Comm Ins  [place]  02/17/2017  1  11/29/2016  Alprazolam 1 Mg Tablet  30 30 [place]  [id number]  Wal (1262)  2 2.00 LME Medicare  [place]  02/14/2017  1  01/06/2017  Oxycodone-Acetaminophen 10-325  60 30 Ma Sim  [id number]  Wal (1262)  0 30.00 MME Medicare    02/02/2017  1  01/21/2017  Phentermine 37.5 Mg Tablet  30 30 Ma Sim  [id number]  Wal ([id number])  0  Comm Ins    01/17/2017  1  01/06/2017  Oxycodone-Acetaminophen 10-325  60 30 Ma Sim  [id number]  Wal (1262)  0 30.00 MME Medicare    01/11/2017  1  11/29/2016  Alprazolam 1 Mg Tablet              

## 2017-07-17 ENCOUNTER — Other Ambulatory Visit: Payer: Self-pay | Admitting: Family Medicine

## 2017-07-17 ENCOUNTER — Encounter: Payer: Self-pay | Admitting: Family Medicine

## 2017-07-17 DIAGNOSIS — M549 Dorsalgia, unspecified: Secondary | ICD-10-CM

## 2017-07-17 NOTE — Assessment & Plan Note (Signed)
Controlled, no change in medication DASH diet and commitment to daily physical activity for a minimum of 30 minutes discussed and encouraged, as a part of hypertension management. The importance of attaining a healthy weight is also discussed.  BP/Weight 07/14/2017 03/07/2017 01/06/2017 09/27/2016 07/29/2016 05/10/2016 01/19/2016  Systolic BP 124 108 114 122 114 120 129  Diastolic BP 84 74 82 80 80 82 85  Wt. (Lbs) 218 208 207 214 209 215.04 218  BMI 38.62 36.85 36.67 37.91 37.02 38.09 38.62  Some encounter information is confidential and restricted. Go to Review Flowsheets activity to see all data.

## 2017-07-17 NOTE — Assessment & Plan Note (Signed)
Uncontrolled, dose adjustment made to increase pain medication to twice daily dosing, hopefully, with weight loss the dose can be reduced once more

## 2017-07-17 NOTE — Assessment & Plan Note (Signed)
Deteriorated. Patient re-educated about  the importance of commitment to a  minimum of 150 minutes of exercise per week.  The importance of healthy food choices with portion control discussed. Encouraged to start a food diary, count calories and to consider  joining a support group. Sample diet sheets offered. Goals set by the patient for the next several months.   Weight /BMI 07/14/2017 03/07/2017 01/06/2017  WEIGHT 218 lb 208 lb 207 lb  HEIGHT 5\' 3"  5\' 3"  5\' 3"   BMI 38.62 kg/m2 36.85 kg/m2 36.67 kg/m2  Some encounter information is confidential and restricted. Go to Review Flowsheets activity to see all data.  Resume phentermine one daily, 10 pound weight loss goal

## 2017-07-17 NOTE — Assessment & Plan Note (Signed)
The patient's Controlled Substance registry is reviewed and compliance confirmed. Adequacy of  Pain control and level of function is assessed. Medication dosing is adjusted as deemed appropriate. Twelve weeks of medication is prescribed , patient signs for the script and is provided with a follow up appointment between 11 to 12 weeks .  

## 2017-07-17 NOTE — Progress Notes (Signed)
Tami Pearson     MRN: [id number]      DOB: 08/18/1965   HPI Tami Pearson is here for follow up and re-evaluation of chronic medical conditions, medication management and review of any available recent lab and radiology data.  Preventive health is updated, specifically  Cancer screening and Immunization.   Mammogram and colonoscopy are both overdue and she will schedule both Has regained a lot of weight by overindulgence in poor food choice, plans to change this and wants to resume phentermine to help her Pain management is also not good , requests dose increase in her medications  ROS Denies recent fever or chills. Denies sinus pressure, nasal congestion, ear pain or sore throat. Denies chest congestion, productive cough or wheezing. Denies chest pains, palpitations and leg swelling Denies abdominal pain, nausea, vomiting,diarrhea or constipation.   Denies dysuria, frequency, hesitancy or incontinence. . Denies headaches, seizures, numbness, or tingling. Denies depression, anxiety or insomnia. Denies skin break down or rash.   PE  BP 124/84   Pulse 88   Resp 16   Ht 5\' 3"  (1.6 m)   Wt 218 lb (98.9 kg)   LMP 03/18/2013   SpO2 99%   BMI 38.62 kg/m   Patient alert and oriented and in no cardiopulmonary distress.  HEENT: No facial asymmetry, EOMI,   oropharynx pink and moist.  Neck supple no JVD, no mass.  Chest: Clear to auscultation bilaterally.  CVS: S1, S2 no murmurs, no S3.Regular rate.  ABD: Soft non tender.   Ext: No edema  MS: Decreased  ROM spine, adequate in  shoulders, hips and knees.  Skin: Intact, no ulcerations or rash noted.  Psych: Good eye contact, normal affect. Memory intact not anxious but  depressed appearing.  CNS: CN 2-12 intact, power,  normal throughout.no focal deficits noted.   Assessment & Plan  Encounter for chronic pain management The patient's Controlled Substance registry is reviewed and compliance confirmed. Adequacy of   Pain control and level of function is assessed. Medication dosing is adjusted as deemed appropriate. Twelve weeks of medication is prescribed , patient signs for the script and is provided with a follow up appointment between 11 to 12 weeks .   Back pain with radiation Uncontrolled, dose adjustment made to increase pain medication to twice daily dosing, hopefully, with weight loss the dose can be reduced once more  Morbid obesity Deteriorated. Patient re-educated about  the importance of commitment to a  minimum of 150 minutes of exercise per week.  The importance of healthy food choices with portion control discussed. Encouraged to start a food diary, count calories and to consider  joining a support group. Sample diet sheets offered. Goals set by the patient for the next several months.   Weight /BMI 07/14/2017 03/07/2017 01/06/2017  WEIGHT 218 lb 208 lb 207 lb  HEIGHT 5\' 3"  5\' 3"  5\' 3"   BMI 38.62 kg/m2 36.85 kg/m2 36.67 kg/m2  Some encounter information is confidential and restricted. Go to Review Flowsheets activity to see all data.  Resume phentermine one daily, 10 pound weight loss goal    Essential hypertension Controlled, no change in medication DASH diet and commitment to daily physical activity for a minimum of 30 minutes discussed and encouraged, as a part of hypertension management. The importance of attaining a healthy weight is also discussed.  BP/Weight 07/14/2017 03/07/2017 01/06/2017 09/27/2016 07/29/2016 05/10/2016 01/19/2016  Systolic BP 124 108 114 122 114 120 129  Diastolic BP 84 74 82 80  80 82 85  Wt. (Lbs) 218 208 207 214 209 215.04 218  BMI 38.62 36.85 36.67 37.91 37.02 38.09 38.62  Some encounter information is confidential and restricted. Go to Review Flowsheets activity to see all data.

## 2017-07-27 ENCOUNTER — Telehealth: Payer: Self-pay | Admitting: Family Medicine

## 2017-07-27 NOTE — Telephone Encounter (Signed)
Patient lvm requesting a call back to phone # [phone] to r/s an appt.   No answer/no voicemail.

## 2017-08-30 ENCOUNTER — Ambulatory Visit (HOSPITAL_COMMUNITY): Payer: Medicare Other | Admitting: Psychiatry

## 2017-09-06 ENCOUNTER — Ambulatory Visit (HOSPITAL_COMMUNITY): Payer: Self-pay | Admitting: Psychiatry

## 2017-09-13 ENCOUNTER — Ambulatory Visit (INDEPENDENT_AMBULATORY_CARE_PROVIDER_SITE_OTHER): Payer: Medicare Other | Admitting: Psychiatry

## 2017-09-13 ENCOUNTER — Ambulatory Visit (HOSPITAL_COMMUNITY): Payer: Self-pay | Admitting: Psychiatry

## 2017-09-13 ENCOUNTER — Encounter (HOSPITAL_COMMUNITY): Payer: Self-pay | Admitting: Psychiatry

## 2017-09-13 VITALS — BP 112/71 | HR 83 | Ht 63.0 in | Wt 204.0 lb

## 2017-09-13 DIAGNOSIS — F331 Major depressive disorder, recurrent, moderate: Secondary | ICD-10-CM

## 2017-09-13 DIAGNOSIS — Z811 Family history of alcohol abuse and dependence: Secondary | ICD-10-CM | POA: Diagnosis not present

## 2017-09-13 DIAGNOSIS — M2550 Pain in unspecified joint: Secondary | ICD-10-CM | POA: Diagnosis not present

## 2017-09-13 DIAGNOSIS — Z818 Family history of other mental and behavioral disorders: Secondary | ICD-10-CM

## 2017-09-13 MED ORDER — ALPRAZOLAM 1 MG PO TABS: 1.0000 mg | ORAL_TABLET | Freq: Every day | ORAL | 2 refills | Status: DC

## 2017-09-13 MED ORDER — DULOXETINE HCL 60 MG PO CPEP: 60.0000 mg | ORAL_CAPSULE | Freq: Two times a day (BID) | ORAL | 2 refills | Status: DC
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
## 2017-09-19 ENCOUNTER — Ambulatory Visit: Payer: Self-pay

## 2017-09-29 ENCOUNTER — Ambulatory Visit: Payer: Self-pay | Admitting: Family Medicine

## 2017-09-30 ENCOUNTER — Other Ambulatory Visit: Payer: Self-pay | Admitting: Family Medicine

## 2017-09-30 DIAGNOSIS — I10 Essential (primary) hypertension: Secondary | ICD-10-CM

## 2017-10-18 ENCOUNTER — Ambulatory Visit (INDEPENDENT_AMBULATORY_CARE_PROVIDER_SITE_OTHER): Payer: Medicare Other | Admitting: Family Medicine

## 2017-10-18 ENCOUNTER — Other Ambulatory Visit: Payer: Self-pay | Admitting: Family Medicine

## 2017-10-18 ENCOUNTER — Encounter: Payer: Self-pay | Admitting: Family Medicine

## 2017-10-18 VITALS — BP 140/88 | HR 69 | Resp 16 | Ht 63.0 in | Wt 212.0 lb

## 2017-10-18 DIAGNOSIS — E1169 Type 2 diabetes mellitus with other specified complication: Secondary | ICD-10-CM | POA: Diagnosis not present

## 2017-10-18 DIAGNOSIS — F3289 Other specified depressive episodes: Secondary | ICD-10-CM

## 2017-10-18 DIAGNOSIS — Z1211 Encounter for screening for malignant neoplasm of colon: Secondary | ICD-10-CM | POA: Diagnosis not present

## 2017-10-18 DIAGNOSIS — F5102 Adjustment insomnia: Secondary | ICD-10-CM

## 2017-10-18 DIAGNOSIS — E669 Obesity, unspecified: Secondary | ICD-10-CM

## 2017-10-18 DIAGNOSIS — G8929 Other chronic pain: Secondary | ICD-10-CM | POA: Diagnosis not present

## 2017-10-18 DIAGNOSIS — R52 Pain, unspecified: Secondary | ICD-10-CM | POA: Diagnosis not present

## 2017-10-18 DIAGNOSIS — R252 Cramp and spasm: Secondary | ICD-10-CM | POA: Diagnosis not present

## 2017-10-18 DIAGNOSIS — Z23 Encounter for immunization: Secondary | ICD-10-CM

## 2017-10-18 DIAGNOSIS — F329 Major depressive disorder, single episode, unspecified: Secondary | ICD-10-CM

## 2017-10-18 DIAGNOSIS — M549 Dorsalgia, unspecified: Secondary | ICD-10-CM | POA: Diagnosis not present

## 2017-10-18 DIAGNOSIS — F419 Anxiety disorder, unspecified: Secondary | ICD-10-CM | POA: Diagnosis not present

## 2017-10-18 DIAGNOSIS — E559 Vitamin D deficiency, unspecified: Secondary | ICD-10-CM | POA: Diagnosis not present

## 2017-10-18 DIAGNOSIS — I10 Essential (primary) hypertension: Secondary | ICD-10-CM | POA: Diagnosis not present

## 2017-10-18 DIAGNOSIS — F32A Depression, unspecified: Secondary | ICD-10-CM

## 2017-10-18 MED ORDER — PREDNISONE 10 MG PO TABS: 10.0000 mg | ORAL_TABLET | Freq: Two times a day (BID) | ORAL | 0 refills | Status: DC

## 2017-10-18 MED ORDER — HYDROXYZINE HCL 50 MG PO TABS: ORAL_TABLET | ORAL | 1 refills | Status: DC

## 2017-10-18 MED ORDER — OXYCODONE-ACETAMINOPHEN 10-325 MG PO TABS: ORAL_TABLET | ORAL | 0 refills | Status: DC

## 2017-10-18 MED ORDER — PHENTERMINE HCL 37.5 MG PO TABS: 37.5000 mg | ORAL_TABLET | Freq: Every day | ORAL | 2 refills | Status: DC

## 2017-10-18 MED ORDER — GABAPENTIN 400 MG PO CAPS: ORAL_CAPSULE | ORAL | 1 refills | Status: DC

## 2017-10-18 MED ORDER — DULOXETINE HCL 60 MG PO CPEP: 60.0000 mg | ORAL_CAPSULE | Freq: Two times a day (BID) | ORAL | 5 refills | Status: DC

## 2017-10-18 MED ORDER — KETOROLAC TROMETHAMINE 60 MG/2ML IM SOLN
60.0000 mg | Freq: Once | INTRAMUSCULAR | Status: AC
  Administered 2017-10-18: 60 mg via INTRAMUSCULAR

## 2017-10-18 MED ORDER — METHYLPREDNISOLONE ACETATE 80 MG/ML IJ SUSP
80.0000 mg | Freq: Once | INTRAMUSCULAR | Status: AC
  Administered 2017-10-18: 80 mg via INTRAMUSCULAR

## 2017-10-18 NOTE — Progress Notes (Deleted)
Tami Pearson     MRN: [id number]      DOB: Dec 20, 1965   HPI Ms. Coop is here for follow up and re-evaluation of chronic medical conditions, medication management and review of any available recent lab and radiology data.  Preventive health is updated, specifically  Cancer screening and Immunization.   Questions or concerns regarding consultations or procedures which the PT has had in the interim are  addressed. The PT denies any adverse reactions to current medications since the last visit.  There are no new concerns.  Tc/o increased and depression and anxiety  Although being treated by Psychiatrist, miosses and will benefit from therapy which she has had in the past and needs  ROS Denies recent fever or chills. Denies sinus pressure, nasal congestion, ear pain or sore throat. Denies chest congestion, productive cough or wheezing. Denies chest pains, palpitations and leg swelling Denies abdominal pain, nausea, vomiting,diarrhea or constipation.   Denies dysuria, frequency, hesitancy or incontinence. Denies headaches, seizures,   Denies skin break down or rash.   PE  BP 140/88   Pulse 69   Resp 16   Ht 5\' 3"  (1.6 m)   Wt 212 lb (96.2 kg)   LMP 03/18/2013   SpO2 100%   BMI 37.55 kg/m   Patient alert and oriented and in no cardiopulmonary distress.  HEENT: No facial asymmetry, EOMI,   oropharynx pink and moist.  Neck supple no JVD, no mass.  Chest: Clear to auscultation bilaterally.  CVS: S1, S2 no murmurs, no S3.Regular rate.  ABD: Soft non tender.   Ext: No edema  MS: decreased  ROM spine, , hips and knees.  Skin: Intact, no ulcerations or rash noted.  Psych: Good eye contact, normal affect. Memory intact both  anxious and  depressed appearing.  CNS: CN 2-12 intact, power,  normal throughout.no focal deficits noted.   Assessment & Plan  Back pain with radiation Uncontrolled.Toradol and depo medrol administered IM in the office , to be followed by a  short course of oral prednisone .   Essential hypertension Uncontrolled , needs to work on lifestyle, no med change at this time DASH diet and commitment to daily physical activity for a minimum of 30 minutes discussed and encouraged, as a part of hypertension management. The importance of attaining a healthy weight is also discussed.  BP/Weight 10/18/2017 07/14/2017 03/07/2017 01/06/2017 09/27/2016 07/29/2016 05/10/2016  Systolic BP 140 124 108 114 122 114 120  Diastolic BP 88 84 74 82 80 80 82  Wt. (Lbs) 212 218 208 207 214 209 215.04  BMI 37.55 38.62 36.85 36.67 37.91 37.02 38.09  Some encounter information is confidential and restricted. Go to Review Flowsheets activity to see all data.       Insomnia due to psychological stress Sleep hygiene reviewed and written information offered also. Prescription sent for  medication needed. Controlled, no change in medication   Morbid obesity Improved, continue pphentermine, reduce caloric intake and increase activity as able Patient re-educated about  the importance of commitment to a  minimum of 150 minutes of exercise per week.  The importance of healthy food choices with portion control discussed. Encouraged to start a food diary, count calories and to consider  joining a support group. Sample diet sheets offered. Goals set by the patient for the next several months.   Weight /BMI 10/18/2017 07/14/2017 03/07/2017  WEIGHT 212 lb 218 lb 208 lb  HEIGHT 5\' 3"  5\' 3"  5\' 3"   BMI 37.55  kg/m2 38.62 kg/m2 36.85 kg/m2  Some encounter information is confidential and restricted. Go to Review Flowsheets activity to see all data.      Encounter for chronic pain management The patient's Controlled Substance registry is reviewed and compliance confirmed. Adequacy of  Pain control and level of function is assessed. Medication dosing is adjusted as deemed appropriate. Twelve weeks of medication is prescribed , patient signs for the script and is provided  with a follow up appointment between 11 to 12 weeks .   Depression Being treated by psychiatrist ,but reports increased anxiety and inability to focus , since July/ August when her husband was incarcerated for arttempted manslaughter, she is trying to get a divorce and is getting a lot of negative messages from his family, feels overwhelmed and anxious, has difficulty getting to appts , and is requesting another attempt at getting back to therapy which helped her a lot in the past

## 2017-10-18 NOTE — Assessment & Plan Note (Signed)
Being treated by psychiatrist ,but reports increased anxiety and inability to focus , since July/ August when her husband was incarcerated for arttempted manslaughter, she is trying to get a divorce and is getting a lot of negative messages from his family, feels overwhelmed and anxious, has difficulty getting to appts , and is requesting another attempt at getting back to therapy which helped her a lot in the past

## 2017-10-18 NOTE — Patient Instructions (Signed)
F/U in 12 weeks , call if you need me before  Flu vaccine today  Injections today for back pain and 5 days of prednisone are sent in  Urine being sent today  Please reschedule your ,mammogram  Please schedule your colonoscopy, you are referred to Dr Harlen [facility] today  I will refer you for therapy and explain the situation to Dr Tenny Crawoss also as far as your limitation in transport  All the best

## 2017-10-18 NOTE — Assessment & Plan Note (Signed)
Sleep hygiene reviewed and written information offered also. Prescription sent for  medication needed. Controlled, no change in medication  

## 2017-10-18 NOTE — Assessment & Plan Note (Signed)
The patient's Controlled Substance registry is reviewed and compliance confirmed. Adequacy of  Pain control and level of function is assessed. Medication dosing is adjusted as deemed appropriate. Twelve weeks of medication is prescribed , patient signs for the script and is provided with a follow up appointment between 11 to 12 weeks .  

## 2017-10-18 NOTE — Progress Notes (Signed)
Tami Pearson     MRN: [id number]      DOB: 10/02/1965   HPI [name] is here for follow up and re-evaluation of chronic medical conditions, medication management and review of any available recent lab and radiology data.  Preventive health is updated, specifically  Cancer screening and Immunization.   Seeing Psychiatry but requests referral to therapy which she benwefited from, but has been having difficulty keeping appointments, and may not be able to return, she relies on public transport. The PT denies any adverse reactions to current medications since the last visit.  There are no new concerns.  There are no specific complaints   ROS Denies recent fever or chills. Denies sinus pressure, nasal congestion, ear pain or sore throat. Denies chest congestion, productive cough or wheezing. Denies chest pains, palpitations and leg swelling Denies abdominal pain, nausea, vomiting,diarrhea or constipation.   Denies dysuria, frequency, hesitancy or incontinence. C.o chronic pain and limitation in mobility, uses a cane to ambulate Denies headaches, seizures, numbness, or tingling. C/o  depression, anxiety and  Insomnia.and is treated for all 3 fairly successfully Denies skin break down or rash.   PE  BP 140/88   Pulse 69   Resp 16   Ht 5\' 3"  (1.6 m)   Wt 212 lb (96.2 kg)   LMP 03/18/2013   SpO2 100%   BMI 37.55 kg/m   Patient alert and oriented and in no cardiopulmonary distress.  HEENT: No facial asymmetry, EOMI,   oropharynx pink and moist.  Neck supple no JVD, no mass.  Chest: Clear to auscultation bilaterally.  CVS: S1, S2 no murmurs, no S3.Regular rate.  ABD: Soft non tender.   Ext: No edema  MS: decreased  ROM spine, , hips and knees.  Skin: Intact, no ulcerations or rash noted.  Psych: Good eye contact, normal affect. Memory intact anxious and  depressed appearing.  CNS: CN 2-12 intact, power,  normal throughout.no focal deficits noted.   Assessment &  Plan  Back pain with radiation Uncontrolled.Toradol and depo medrol administered IM in the office , to be followed by a short course of oral prednisone .   Essential hypertension Uncontrolled , needs to work on lifestyle, no med change at this time DASH diet and commitment to daily physical activity for a minimum of 30 minutes discussed and encouraged, as a part of hypertension management. The importance of attaining a healthy weight is also discussed.  BP/Weight 10/18/2017 07/14/2017 03/07/2017 01/06/2017 09/27/2016 07/29/2016 05/10/2016  Systolic BP 140 124 108 114 122 114 120  Diastolic BP 88 84 74 82 80 80 82  Wt. (Lbs) 212 218 208 207 214 209 215.04  BMI 37.55 38.62 36.85 36.67 37.91 37.02 38.09  Some encounter information is confidential and restricted. Go to Review Flowsheets activity to see all data.       Insomnia due to psychological stress Sleep hygiene reviewed and written information offered also. Prescription sent for  medication needed. Controlled, no change in medication   Morbid obesity Improved, continue pphentermine, reduce caloric intake and increase activity as able Patient re-educated about  the importance of commitment to a  minimum of 150 minutes of exercise per week.  The importance of healthy food choices with portion control discussed. Encouraged to start a food diary, count calories and to consider  joining a support group. Sample diet sheets offered. Goals set by the patient for the next several months.   Weight /BMI 10/18/2017 07/14/2017 03/07/2017  WEIGHT 212  lb 218 lb 208 lb  HEIGHT 5\' 3"  5\' 3"  5\' 3"   BMI 37.55 kg/m2 38.62 kg/m2 36.85 kg/m2  Some encounter information is confidential and restricted. Go to Review Flowsheets activity to see all data.      Encounter for chronic pain management The patient's Controlled Substance registry is reviewed and compliance confirmed. Adequacy of  Pain control and level of function is assessed. Medication dosing  is adjusted as deemed appropriate. Twelve weeks of medication is prescribed , patient signs for the script and is provided with a follow up appointment between 11 to 12 weeks .   Depression Being treated by psychiatrist ,but reports increased anxiety and inability to focus , since July/ August when her husband was incarcerated for arttempted manslaughter, she is trying to get a divorce and is getting a lot of negative messages from his family, feels overwhelmed and anxious, has difficulty getting to appts , and is requesting another attempt at getting back to therapy which helped her a lot in the past

## 2017-10-18 NOTE — Progress Notes (Deleted)
Tami Pearson     MRN: [id number]      DOB: [birth year]   HPI Tami Pearson is here for follow up and re-evaluation of chronic medical conditions, medication management and review of any available recent lab and radiology data.  Preventive health is updated, specifically  Cancer screening and Immunization.   Questions or concerns regarding consultations or procedures which the PT has had in the interim are  addressed. The PT denies any adverse reactions to current medications since the last visit.  There are no new concerns.  There are no specific complaints   ROS Denies recent fever or chills. Denies sinus pressure, nasal congestion, ear pain or sore throat. Denies chest congestion, productive cough or wheezing. Denies chest pains, palpitations and leg swelling Denies abdominal pain, nausea, vomiting,diarrhea or constipation.   Denies dysuria, frequency, hesitancy or incontinence. Denies joint pain, swelling and limitation in mobility. Denies headaches, seizures, numbness, or tingling. Denies depression, anxiety or insomnia. Denies skin break down or rash.   PE  BP 140/88   Pulse 69   Resp 16   Ht 5\' 3"  (1.6 m)   Wt 212 lb (96.2 kg)   LMP 03/18/2013   SpO2 100%   BMI 37.55 kg/m   Patient alert and oriented and in no cardiopulmonary distress.  HEENT: No facial asymmetry, EOMI,   oropharynx pink and moist.  Neck supple no JVD, no mass.  Chest: Clear to auscultation bilaterally.  CVS: S1, S2 no murmurs, no S3.Regular rate.  ABD: Soft non tender.   Ext: No edema  MS: Adequate ROM spine, shoulders, hips and knees.  Skin: Intact, no ulcerations or rash noted.  Psych: Good eye contact, normal affect. Memory intact not anxious or depressed appearing.  CNS: CN 2-12 intact, power,  normal throughout.no focal deficits noted.   Assessment & Plan  ***  Tami Pearson     MRN: [id number]      DOB: [birth year]   HPI Tami Pearson is here for follow up and  re-evaluation of chronic medical conditions, medication management and review of any available recent lab and radiology data.  Preventive health is updated, specifically  Cancer screening and Immunization.   Questions or concerns regarding consultations or procedures which the PT has had in the interim are  addressed. The PT denies any adverse reactions to current medications since the last visit.  There are no new concerns.  There are no specific complaints   ROS Denies recent fever or chills. Denies sinus pressure, nasal congestion, ear pain or sore throat. Denies chest congestion, productive cough or wheezing. Denies chest pains, palpitations and leg swelling Denies abdominal pain, nausea, vomiting,diarrhea or constipation.   Denies dysuria, frequency, hesitancy or incontinence. Denies joint pain, swelling and limitation in mobility. Denies headaches, seizures, numbness, or tingling. Denies depression, anxiety or insomnia. Denies skin break down or rash.   PE  BP 140/88   Pulse 69   Resp 16   Ht 5\' 3"  (1.6 m)   Wt 212 lb (96.2 kg)   LMP 03/18/2013   SpO2 100%   BMI 37.55 kg/m   Patient alert and oriented and in no cardiopulmonary distress.  HEENT: No facial asymmetry, EOMI,   oropharynx pink and moist.  Neck supple no JVD, no mass.  Chest: Clear to auscultation bilaterally.  CVS: S1, S2 no murmurs, no S3.Regular rate.  ABD: Soft non tender.   Ext: No edema  MS: Adequate ROM spine, shoulders, hips and knees.  Skin:  Intact, no ulcerations or rash noted.  Psych: Good eye contact, normal affect. Memory intact not anxious or depressed appearing.  CNS: CN 2-12 intact, power,  normal throughout.no focal deficits noted.   Assessment & Plan  ***   Tami Pearson     MRN: [id number]      DOB: August 26, 1965  HPI: Patient is in for annual physical exam. No other health concerns are expressed or addressed at the visit. Recent labs, if available are  reviewed. Immunization is reviewed , and  updated if needed.   PE: Pleasant  female, alert and oriented x 3, in no cardio-pulmonary distress. Afebrile. HEENT No facial trauma or asymetry. Sinuses non tender.  Extra occullar muscles intact, pupils equally reactive to light. External ears normal, tympanic membranes clear. Oropharynx moist, no exudate. Neck: supple, no adenopathy,JVD or thyromegaly.No bruits.  Chest: Clear to ascultation bilaterally.No crackles or wheezes. Non tender to palpation  Breast: No asymetry,no masses or lumps. No tenderness. No nipple discharge or inversion. No axillary or supraclavicular adenopathy  Cardiovascular system; Heart sounds normal,  S1 and  S2 ,no S3.  No murmur, or thrill. Apical beat not displaced Peripheral pulses normal.  Abdomen: Soft, non tender, no organomegaly or masses. No bruits. Bowel sounds normal. No guarding, tenderness or rebound.  Rectal:  Normal sphincter tone. No rectal mass. Guaiac negative stool.  GU: External genitalia normal female genitalia , normal female distribution of hair. No lesions. Urethral meatus normal in size, no  Prolapse, no lesions visibly  Present. Bladder non tender. Vagina pink and moist , with no visible lesions , discharge present . Adequate pelvic support no  cystocele or rectocele noted Cervix pink and appears healthy, no lesions or ulcerations noted, no discharge noted from os Uterus normal size, no adnexal masses, no cervical motion or adnexal tenderness.   Musculoskeletal exam: Full ROM of spine, hips , shoulders and knees. No deformity ,swelling or crepitus noted. No muscle wasting or atrophy.   Neurologic: Cranial nerves 2 to 12 intact. Power, tone ,sensation and reflexes normal throughout. No disturbance in gait. No tremor.  Skin: Intact, no ulceration, erythema , scaling or rash noted. Pigmentation normal throughout  Psych; Normal mood and affect. Judgement and  concentration normal   Assessment & Plan:  Back pain with radiation Uncontrolled.Toradol and depo medrol administered IM in the office , to be followed by a short course of oral prednisone .   Essential hypertension Uncontrolled , needs to work on lifestyle, no med change at this time DASH diet and commitment to daily physical activity for a minimum of 30 minutes discussed and encouraged, as a part of hypertension management. The importance of attaining a healthy weight is also discussed.  BP/Weight 10/18/2017 07/14/2017 03/07/2017 01/06/2017 09/27/2016 07/29/2016 05/10/2016  Systolic BP 140 124 108 114 122 114 120  Diastolic BP 88 84 74 82 80 80 82  Wt. (Lbs) 212 218 208 207 214 209 215.04  BMI 37.55 38.62 36.85 36.67 37.91 37.02 38.09  Some encounter information is confidential and restricted. Go to Review Flowsheets activity to see all data.       Insomnia due to psychological stress Sleep hygiene reviewed and written information offered also. Prescription sent for  medication needed. Controlled, no change in medication   Morbid obesity Improved, continue pphentermine, reduce caloric intake and increase activity as able Patient re-educated about  the importance of commitment to a  minimum of 150 minutes of exercise per week.  The importance of healthy food  choices with portion control discussed. Encouraged to start a food diary, count calories and to consider  joining a support group. Sample diet sheets offered. Goals set by the patient for the next several months.   Weight /BMI 10/18/2017 07/14/2017 03/07/2017  WEIGHT 212 lb 218 lb 208 lb  HEIGHT 5\' 3"  5\' 3"  5\' 3"   BMI 37.55 kg/m2 38.62 kg/m2 36.85 kg/m2  Some encounter information is confidential and restricted. Go to Review Flowsheets activity to see all data.      Encounter for chronic pain management The patient's Controlled Substance registry is reviewed and compliance confirmed. Adequacy of  Pain control and level of  function is assessed. Medication dosing is adjusted as deemed appropriate. Twelve weeks of medication is prescribed , patient signs for the script and is provided with a follow up appointment between 11 to 12 weeks .   Depression Being treated by psychiatrist ,but reports increased anxiety and inability to focus , since July/ August when her husband was incarcerated for arttempted manslaughter, she is trying to get a divorce and is getting a lot of negative messages from his family, feels overwhelmed and anxious, has difficulty getting to appts , and is requesting another attempt at getting back to therapy which helped her a lot in the past Fill Date ID Written Drug Qty Days Prescriber Rx # Pharmacy Refill Daily Dose * Pymt Type PMP  10/15/2017  1  09/13/2017  Alprazolam 1 Mg Tablet  30.00 30 [place]  [id number]  [facility] ([id number])  1/2 2.00 LME Medicare  [place]  09/13/2017  1  09/13/2017  Alprazolam 1 Mg Tablet  30.00 30 [place]  [id number]  [facility] ([id number])  0/2 2.00 LME Medicare  [place]  09/13/2017  1  07/14/2017  Oxycodone-Acetaminophen 10-325  60.00 30 Ma [place]  [id number]  [facility] ([id number])  0/0 30.00 MME Medicare  [place]  08/31/2017  1  07/14/2017  Phentermine 37.5 Mg Tablet  30.00 30 Ma Sim  [id number]  [facility] ([id number])  1/2  Private Pay  [place]  08/15/2017  1  05/31/2017  Alprazolam 1 Mg Tablet  30.00 30 [place]  [id number]  [facility] ([id number])  2/2 2.00 LME Medicare  [place]  08/13/2017  1  07/14/2017  Oxycodone-Acetaminophen 10-325  60.00 30 Ma [place]  [id number]  [facility] ([id number])  0/0 30.00 MME Medicare  [place]  07/18/2017  1  05/31/2017  Alprazolam 1 Mg Tablet  30.00 30 [place]  [id number]  [facility] (1262)  1/2 2.00 LME Medicare  [place]  07/14/2017  1  07/14/2017  Phentermine 37.5 Mg Tablet  30.00 30 Ma Sim  [id number]  [facility] (1262)  0/2  Private Pay  [place]  07/14/2017  1  07/14/2017  Oxycodone-Acetaminophen 10-325  60.00 30 Ma [place]  [id number]  [facility] ([id number])  0/0 30.00 MME Medicare  [place]  06/20/2017  1  05/31/2017  Alprazolam 1 Mg Tablet  30.00 30 [place]  [id number]  [facility] (1262)  0/2 2.00 LME Medicare  [place]   06/01/2017  1  06/01/2017  Oxycodone-Acetaminophen 10-325  30.00 30 [place]  [id number]  [facility] (1262)  0/0 15.00 MME Medicare  [place]  05/28/2017  1  03/07/2017  Phentermine 37.5 Mg Tablet  30.00 30 Ma Sim  [id number]  [facility] (1262)  1/1  Private Pay  [place]  05/23/2017  1  03/01/2017  Alprazolam 1 Mg Tablet  30.00 30 [place]  [id number]  [facility] (1248)  0/0 2.00 LME Medicare  [place]  04/15/2017  1  03/01/2017  Alprazolam 1 Mg Tablet  30.00 30 [place]  [id number]  [facility] (1262)  1/2 2.00 LME Medicare  [place]  04/10/2017  1  03/07/2017  Phentermine 37.5 Mg Tablet  30.00 30 Ma Sim  [id number]  [facility] (1262)  0/1  Private Pay  [place]  03/18/2017  1  03/01/2017  Alprazolam 1 Mg Tablet  30.00 30 [place]

## 2017-10-18 NOTE — Assessment & Plan Note (Signed)
Uncontrolled , needs to work on lifestyle, no med change at this time DASH diet and commitment to daily physical activity for a minimum of 30 minutes discussed and encouraged, as a part of hypertension management. The importance of attaining a healthy weight is also discussed.  BP/Weight 10/18/2017 07/14/2017 03/07/2017 01/06/2017 09/27/2016 07/29/2016 05/10/2016  Systolic BP 140 124 108 114 122 114 120  Diastolic BP 88 84 74 82 80 80 82  Wt. (Lbs) 212 218 208 207 214 209 215.04  BMI 37.55 38.62 36.85 36.67 37.91 37.02 38.09  Some encounter information is confidential and restricted. Go to Review Flowsheets activity to see all data.

## 2017-10-18 NOTE — Assessment & Plan Note (Signed)
Uncontrolled.Toradol and depo medrol administered IM in the office , to be followed by a short course of oral prednisone   

## 2017-10-18 NOTE — Assessment & Plan Note (Signed)
Improved, continue pphentermine, reduce caloric intake and increase activity as able Patient re-educated about  the importance of commitment to a  minimum of 150 minutes of exercise per week.  The importance of healthy food choices with portion control discussed. Encouraged to start a food diary, count calories and to consider  joining a support group. Sample diet sheets offered. Goals set by the patient for the next several months.   Weight /BMI 10/18/2017 07/14/2017 03/07/2017  WEIGHT 212 lb 218 lb 208 lb  HEIGHT 5\' 3"  5\' 3"  5\' 3"   BMI 37.55 kg/m2 38.62 kg/m2 36.85 kg/m2  Some encounter information is confidential and restricted. Go to Review Flowsheets activity to see all data.

## 2017-10-19 ENCOUNTER — Other Ambulatory Visit (HOSPITAL_COMMUNITY)
Admission: RE | Admit: 2017-10-19 | Discharge: 2017-10-19 | Disposition: A | Discharge status: Home / Self Care | Payer: Medicare Other | Source: Other Acute Inpatient Hospital | Attending: Family Medicine | Admitting: Family Medicine

## 2017-10-19 DIAGNOSIS — R52 Pain, unspecified: Secondary | ICD-10-CM | POA: Insufficient documentation

## 2017-10-19 LAB — COMPLETE METABOLIC PANEL WITH GFR
AG Ratio: 1 (calc) (ref 1.0–2.5)
ALT: 15 U/L (ref 6–29)
AST: 20 U/L (ref 10–35)
Albumin: 3.8 g/dL (ref 3.6–5.1)
Alkaline phosphatase (APISO): 57 U/L (ref 33–130)
BILIRUBIN TOTAL: 0.3 mg/dL (ref 0.2–1.2)
BUN: 19 mg/dL (ref 7–25)
CHLORIDE: 104 mmol/L (ref 98–110)
CO2: 30 mmol/L (ref 20–32)
Calcium: 9.4 mg/dL (ref 8.6–10.4)
Creat: 0.94 mg/dL (ref 0.50–1.05)
GFR, EST AFRICAN AMERICAN: 81 mL/min/{1.73_m2} (ref >=60)
GFR, Est Non African American: 70 mL/min/{1.73_m2} (ref >=60)
Globulin: 4 g/dL (calc) — ABNORMAL HIGH (ref 1.9–3.7)
Glucose, Bld: 98 mg/dL (ref 65–99)
POTASSIUM: 4.1 mmol/L (ref 3.5–5.3)
SODIUM: 139 mmol/L (ref 135–146)
TOTAL PROTEIN: 7.8 g/dL (ref 6.1–8.1)

## 2017-10-19 LAB — HEMOGLOBIN A1C
Hgb A1c MFr Bld: 5.9 % of total Hgb — ABNORMAL HIGH (ref <5.7)
Mean Plasma Glucose: 123 (calc)
eAG (mmol/L): 6.8 (calc)

## 2017-10-19 LAB — RAPID URINE DRUG SCREEN, HOSP PERFORMED
AMPHETAMINES: NOT DETECTED
BENZODIAZEPINES: POSITIVE — AB
Barbiturates: NOT DETECTED
Cocaine: POSITIVE — AB
Opiates: NOT DETECTED
Tetrahydrocannabinol: NOT DETECTED

## 2017-10-19 LAB — VITAMIN D 25 HYDROXY (VIT D DEFICIENCY, FRACTURES): VIT D 25 HYDROXY: 27 ng/mL — AB (ref 30–100)

## 2017-10-19 LAB — LIPID PANEL
Cholesterol: 196 mg/dL (ref <200)
HDL: 60 mg/dL (ref >50)
LDL CHOLESTEROL (CALC): 117 mg/dL — AB
NON-HDL CHOLESTEROL (CALC): 136 mg/dL — AB (ref <130)
Total CHOL/HDL Ratio: 3.3 (calc) (ref <5.0)
Triglycerides: 87 mg/dL (ref <150)

## 2017-10-19 LAB — MAGNESIUM: Magnesium: 1.9 mg/dL (ref 1.5–2.5)

## 2017-10-19 LAB — TSH: TSH: 1.5 mIU/L

## 2017-10-24 ENCOUNTER — Encounter: Payer: Self-pay | Admitting: Internal Medicine

## 2017-10-31 ENCOUNTER — Telehealth: Payer: Self-pay | Admitting: Family Medicine

## 2017-10-31 NOTE — Telephone Encounter (Signed)
fyi she is coming in Thursday 10/3 10:20am

## 2017-11-03 ENCOUNTER — Other Ambulatory Visit: Payer: Self-pay

## 2017-11-03 ENCOUNTER — Encounter: Payer: Self-pay | Admitting: Family Medicine

## 2017-11-03 ENCOUNTER — Telehealth: Payer: Self-pay

## 2017-11-03 ENCOUNTER — Ambulatory Visit (INDEPENDENT_AMBULATORY_CARE_PROVIDER_SITE_OTHER): Payer: Medicare Other | Admitting: Family Medicine

## 2017-11-03 VITALS — BP 130/88 | HR 66 | Resp 12 | Ht 63.5 in | Wt 210.1 lb

## 2017-11-03 DIAGNOSIS — I10 Essential (primary) hypertension: Secondary | ICD-10-CM

## 2017-11-03 DIAGNOSIS — F3289 Other specified depressive episodes: Secondary | ICD-10-CM

## 2017-11-03 DIAGNOSIS — M549 Dorsalgia, unspecified: Secondary | ICD-10-CM

## 2017-11-03 DIAGNOSIS — F321 Major depressive disorder, single episode, moderate: Secondary | ICD-10-CM

## 2017-11-03 NOTE — Telephone Encounter (Signed)
Spoke with Ree Kida at [facility] in [place] and let him know that per Dr.Simpson patient is not to get any more refills on oxycodone. He verbalized understanding.

## 2017-11-03 NOTE — Patient Instructions (Addendum)
F/U  In [place] with MD, (  Mid) , please cancel December  appt with MD  Weight loss goal of 6 to 8 pounds   I will send a message to your therapist next door to see if you  Can get new appt  All the best and continue to work on improving your health

## 2017-11-05 ENCOUNTER — Encounter: Payer: Self-pay | Admitting: Family Medicine

## 2017-11-05 NOTE — Assessment & Plan Note (Signed)
Slight improvement, applauded on this Patient re-educated about  the importance of commitment to a  minimum of 150 minutes of exercise per week.  The importance of healthy food choices with portion control discussed. Encouraged to start a food diary, count calories and to consider  joining a support group. Sample diet sheets offered. Goals set by the patient for the next several months.   Weight /BMI 11/03/2017 10/18/2017 07/14/2017  WEIGHT 210 lb 1.9 oz 212 lb 218 lb  HEIGHT 5' 3.5" 5\' 3"  5\' 3"   BMI 36.64 kg/m2 37.55 kg/m2 38.62 kg/m2  Some encounter information is confidential and restricted. Go to Review Flowsheets activity to see all data.

## 2017-11-05 NOTE — Assessment & Plan Note (Signed)
Controlled, no change in medication DASH diet and commitment to daily physical activity for a minimum of 30 minutes discussed and encouraged, as a part of hypertension management. The importance of attaining a healthy weight is also discussed.  BP/Weight 11/03/2017 10/18/2017 07/14/2017 03/07/2017 01/06/2017 09/27/2016 07/29/2016  Systolic BP 130 140 124 108 114 122 114  Diastolic BP 88 88 84 74 82 80 80  Wt. (Lbs) 210.12 212 218 208 207 214 209  BMI 36.64 37.55 38.62 36.85 36.67 37.91 37.02  Some encounter information is confidential and restricted. Go to Review Flowsheets activity to see all data.

## 2017-11-05 NOTE — Progress Notes (Signed)
Tami Pearson     MRN: [id number]      DOB: 1965/08/03   HPI Tami Pearson is here to discuss recent urine tox screen, which , though she is on a pain contract , shows evidence of diversion, no hydrocodone in her urine and urine positive for cocaine. I stringy suggested and encouraged joining and addiction group, she stated she did not need this as her cocaine use was only "once" She understands clearly that her pain contract has been broken, so that she will no longer be prescribed medication for chronic back pain, states she understands and does not need script or treatment for back pain States she is sorry about breaking contract , but " very stressed" , still hoping to get back to therapist   ROS Denies recent fever or chills. Denies sinus pressure, nasal congestion, ear pain or sore throat. Denies chest congestion, productive cough or wheezing. Denies chest pains, palpitations and leg swelling Denies abdominal pain, nausea, vomiting,diarrhea or constipation.   Denies dysuria, frequency, hesitancy or incontinence. Denies joint pain, swelling and limitation in mobility. Denies headaches, seizures, numbness, or tingling. Denies depression, anxiety or insomnia. Denies skin break down or rash.   PE  BP 130/88 (BP Location: Left Arm, Patient Position: Sitting) Comment (Cuff Size): thigh  Pulse 66   Resp 12   Ht 5' 3.5" (1.613 m)   Wt 210 lb 1.9 oz (95.3 kg)   LMP 03/18/2013   SpO2 98% Comment: room air  BMI 36.64 kg/m   Patient alert and oriented and in no cardiopulmonary distress.  HEENT: No facial asymmetry, EOMI,   oropharynx pink and moist.  Neck supple no JVD, no mass.  Chest: Clear to auscultation bilaterally.  CVS: S1, S2 no murmurs, no S3.Regular rate.  ABD: Soft non tender.   Ext: No edema  JX:[id number] ROM spine and knees  Skin: Intact, no ulcerations or rash noted.  Psych: Poor eye contact, depressed  affect. Memory intact mildly  anxious and   depressed appearing.  CNS: CN 2-12 intact, power,  normal throughout.no focal deficits noted.   Assessment & Plan  Back pain with radiation Weight loss andti inflammatories pt's pain contract is null and void, she has broken contract  Essential hypertension Controlled, no change in medication DASH diet and commitment to daily physical activity for a minimum of 30 minutes discussed and encouraged, as a part of hypertension management. The importance of attaining a healthy weight is also discussed.  BP/Weight 11/03/2017 10/18/2017 07/14/2017 03/07/2017 01/06/2017 09/27/2016 07/29/2016  Systolic BP 130 140 124 108 114 122 114  Diastolic BP 88 88 84 74 82 80 80  Wt. (Lbs) 210.12 212 218 208 207 214 209  BMI 36.64 37.55 38.62 36.85 36.67 37.91 37.02  Some encounter information is confidential and restricted. Go to Review Flowsheets activity to see all data.       Morbid obesity Slight improvement, applauded on this Patient re-educated about  the importance of commitment to a  minimum of 150 minutes of exercise per week.  The importance of healthy food choices with portion control discussed. Encouraged to start a food diary, count calories and to consider  joining a support group. Sample diet sheets offered. Goals set by the patient for the next several months.   Weight /BMI 11/03/2017 10/18/2017 07/14/2017  WEIGHT 210 lb 1.9 oz 212 lb 218 lb  HEIGHT 5' 3.5" 5\' 3"  5\' 3"   BMI 36.64 kg/m2 37.55 kg/m2 38.62 kg/m2  Some encounter information  is confidential and restricted. Go to Review Flowsheets activity to see all data.      Depression Medication management by Psych, but does need therapy, will try again to have her re established with previous therapist, she is not suicidal or ho,micidal

## 2017-11-05 NOTE — Assessment & Plan Note (Signed)
Weight loss andti inflammatories pt's pain contract is null and void, she has broken contract

## 2017-11-05 NOTE — Assessment & Plan Note (Signed)
Medication management by Psych, but does need therapy, will try again to have her re established with previous therapist, she is not suicidal or ho,micidal

## 2017-11-21 ENCOUNTER — Ambulatory Visit (INDEPENDENT_AMBULATORY_CARE_PROVIDER_SITE_OTHER): Payer: Medicare Other

## 2017-11-21 VITALS — BP 128/78 | HR 73 | Resp 15 | Ht 63.5 in | Wt 217.0 lb

## 2017-11-21 DIAGNOSIS — Z1231 Encounter for screening mammogram for malignant neoplasm of breast: Secondary | ICD-10-CM | POA: Diagnosis not present

## 2017-11-21 DIAGNOSIS — Z1159 Encounter for screening for other viral diseases: Secondary | ICD-10-CM

## 2017-11-21 DIAGNOSIS — Z0000 Encounter for general adult medical examination without abnormal findings: Secondary | ICD-10-CM | POA: Diagnosis not present
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
## 2017-11-21 NOTE — Patient Instructions (Signed)
Tami Pearson , Thank you for taking time to come for your Medicare Wellness Visit. I appreciate your ongoing commitment to your health goals. Please review the following plan we discussed and let me know if I can assist you in the future.   Screening recommendations/referrals: Colonoscopy: up to date  Mammogram: order sent to [facility]  Bone Density: N/A Recommended yearly ophthalmology/optometry visit for glaucoma screening and checkup Recommended yearly dental visit for hygiene and checkup  Vaccinations: Influenza vaccine: up to date  Pneumococcal vaccine: up to date  Tdap vaccine: up to date  Shingles vaccine: ask insurance if Shingrix is covered     Advanced directives: form given with AVS   Conditions/risks identified: sedentary lifestyle- will plan to walk for 30 mins 5 days a week   Next appointment: wellness in 1 year   Preventive Care 40-64 Years, Female Preventive care refers to lifestyle choices and visits with your health care provider that can promote health and wellness. What does preventive care include?  A yearly physical exam. This is also called an annual well check.  Dental exams once or twice a year.  Routine eye exams. Ask your health care provider how often you should have your eyes checked.  Personal lifestyle choices, including:  Daily care of your teeth and gums.  Regular physical activity.  Eating a healthy diet.  Avoiding tobacco and drug use.  Limiting alcohol use.  Practicing safe sex.  Taking low-dose aspirin daily starting at age 59.  Taking vitamin and mineral supplements as recommended by your health care provider. What happens during an annual well check? The services and screenings done by your health care provider during your annual well check will depend on your age, overall health, lifestyle risk factors, and family history of disease. Counseling  Your health care provider may ask you questions about your:  Alcohol  use.  Tobacco use.  Drug use.  Emotional well-being.  Home and relationship well-being.  Sexual activity.  Eating habits.  Work and work Statistician.  Method of birth control.  Menstrual cycle.  Pregnancy history. Screening  You may have the following tests or measurements:  Height, weight, and BMI.  Blood pressure.  Lipid and cholesterol levels. These may be checked every 5 years, or more frequently if you are over 3 years old.  Skin check.  Lung cancer screening. You may have this screening every year starting at age 67 if you have a 30-pack-year history of smoking and currently smoke or have quit within the past 15 years.  Fecal occult blood test (FOBT) of the stool. You may have this test every year starting at age 40.  Flexible sigmoidoscopy or colonoscopy. You may have a sigmoidoscopy every 5 years or a colonoscopy every 10 years starting at age 67.  Hepatitis C blood test.  Hepatitis B blood test.  Sexually transmitted disease (STD) testing.  Diabetes screening. This is done by checking your blood sugar (glucose) after you have not eaten for a while (fasting). You may have this done every 1-3 years.  Mammogram. This may be done every 1-2 years. Talk to your health care provider about when you should start having regular mammograms. This may depend on whether you have a family history of breast cancer.  BRCA-related cancer screening. This may be done if you have a family history of breast, ovarian, tubal, or peritoneal cancers.  Pelvic exam and Pap test. This may be done every 3 years starting at age 78. Starting at age  30, this may be done every 5 years if you have a Pap test in combination with an HPV test.  Bone density scan. This is done to screen for osteoporosis. You may have this scan if you are at high risk for osteoporosis. Discuss your test results, treatment options, and if necessary, the need for more tests with your health care  provider. Vaccines  Your health care provider may recommend certain vaccines, such as:  Influenza vaccine. This is recommended every year.  Tetanus, diphtheria, and acellular pertussis (Tdap, Td) vaccine. You may need a Td booster every 10 years.  Zoster vaccine. You may need this after age 42.  Pneumococcal 13-valent conjugate (PCV13) vaccine. You may need this if you have certain conditions and were not previously vaccinated.  Pneumococcal polysaccharide (PPSV23) vaccine. You may need one or two doses if you smoke cigarettes or if you have certain conditions. Talk to your health care provider about which screenings and vaccines you need and how often you need them. This information is not intended to replace advice given to you by your health care provider. Make sure you discuss any questions you have with your health care provider. Document Released: 02/14/2015 Document Revised: 10/08/2015 Document Reviewed: 11/19/2014 Elsevier Interactive Patient Education  2017 [place] Prevention in the Home Falls can cause injuries. They can happen to people of all ages. There are many things you can do to make your home safe and to help prevent falls. What can I do on the outside of my home?  Regularly fix the edges of walkways and driveways and fix any cracks.  Remove anything that might make you trip as you walk through a door, such as a raised step or threshold.  Trim any bushes or trees on the path to your home.  Use bright outdoor lighting.  Clear any walking paths of anything that might make someone trip, such as rocks or tools.  Regularly check to see if handrails are loose or broken. Make sure that both sides of any steps have handrails.  Any raised decks and porches should have guardrails on the edges.  Have any leaves, snow, or ice cleared regularly.  Use sand or salt on walking paths during winter.  Clean up any spills in your garage right away. This includes  oil or grease spills. What can I do in the bathroom?  Use night lights.  Install grab bars by the toilet and in the tub and shower. Do not use towel bars as grab bars.  Use non-skid mats or decals in the tub or shower.  If you need to sit down in the shower, use a plastic, non-slip stool.  Keep the floor dry. Clean up any water that spills on the floor as soon as it happens.  Remove soap buildup in the tub or shower regularly.  Attach bath mats securely with double-sided non-slip rug tape.  Do not have throw rugs and other things on the floor that can make you trip. What can I do in the bedroom?  Use night lights.  Make sure that you have a light by your bed that is easy to reach.  Do not use any sheets or blankets that are too big for your bed. They should not hang down onto the floor.  Have a firm chair that has side arms. You can use this for support while you get dressed.  Do not have throw rugs and other things on the floor that can  make you trip. What can I do in the kitchen?  Clean up any spills right away.  Avoid walking on wet floors.  Keep items that you use a lot in easy-to-reach places.  If you need to reach something above you, use a strong step stool that has a grab bar.  Keep electrical cords out of the way.  Do not use floor polish or wax that makes floors slippery. If you must use wax, use non-skid floor wax.  Do not have throw rugs and other things on the floor that can make you trip. What can I do with my stairs?  Do not leave any items on the stairs.  Make sure that there are handrails on both sides of the stairs and use them. Fix handrails that are broken or loose. Make sure that handrails are as long as the stairways.  Check any carpeting to make sure that it is firmly attached to the stairs. Fix any carpet that is loose or worn.  Avoid having throw rugs at the top or bottom of the stairs. If you do have throw rugs, attach them to the floor  with carpet tape.  Make sure that you have a light switch at the top of the stairs and the bottom of the stairs. If you do not have them, ask someone to add them for you. What else can I do to help prevent falls?  Wear shoes that:  Do not have high heels.  Have rubber bottoms.  Are comfortable and fit you well.  Are closed at the toe. Do not wear sandals.  If you use a stepladder:  Make sure that it is fully opened. Do not climb a closed stepladder.  Make sure that both sides of the stepladder are locked into place.  Ask someone to hold it for you, if possible.  Clearly mark and make sure that you can see:  Any grab bars or handrails.  First and last steps.  Where the edge of each step is.  Use tools that help you move around (mobility aids) if they are needed. These include:  Canes.  Walkers.  Scooters.  Crutches.  Turn on the lights when you go into a dark area. Replace any light bulbs as soon as they burn out.  Set up your furniture so you have a clear path. Avoid moving your furniture around.  If any of your floors are uneven, fix them.  If there are any pets around you, be aware of where they are.  Review your medicines with your doctor. Some medicines can make you feel dizzy. This can increase your chance of falling. Ask your doctor what other things that you can do to help prevent falls. This information is not intended to replace advice given to you by your health care provider. Make sure you discuss any questions you have with your health care provider. Document Released: 11/14/2008 Document Revised: 06/26/2015 Document Reviewed: 02/22/2014 Elsevier Interactive Patient Education  2017 [employer].

## 2017-12-14 ENCOUNTER — Ambulatory Visit (HOSPITAL_COMMUNITY): Payer: Self-pay | Admitting: Psychiatry

## 2017-12-22 ENCOUNTER — Ambulatory Visit (HOSPITAL_COMMUNITY): Payer: Medicare Other | Admitting: Psychiatry

## 2018-01-09 ENCOUNTER — Ambulatory Visit (HOSPITAL_COMMUNITY): Payer: Medicare Other | Admitting: Psychiatry

## 2018-01-10 ENCOUNTER — Ambulatory Visit: Payer: Self-pay | Admitting: Family Medicine

## 2018-01-16 ENCOUNTER — Ambulatory Visit: Payer: Medicare Other | Admitting: Gastroenterology

## 2018-01-16 ENCOUNTER — Telehealth: Payer: Self-pay | Admitting: Internal Medicine

## 2018-01-16 ENCOUNTER — Encounter: Payer: Self-pay | Admitting: Gastroenterology

## 2018-01-16 NOTE — Telephone Encounter (Signed)
PATIENT WAS A NO SHOW AND LETTER SENT  °

## 2018-01-21 DIAGNOSIS — H01003 Unspecified blepharitis right eye, unspecified eyelid: Secondary | ICD-10-CM | POA: Diagnosis not present

## 2018-01-21 DIAGNOSIS — H01009 Unspecified blepharitis unspecified eye, unspecified eyelid: Secondary | ICD-10-CM | POA: Diagnosis not present

## 2018-01-31 ENCOUNTER — Encounter: Payer: Self-pay | Admitting: Family Medicine

## 2018-02-08 ENCOUNTER — Ambulatory Visit: Payer: Medicare Other | Admitting: Family Medicine

## 2018-03-08 ENCOUNTER — Ambulatory Visit (INDEPENDENT_AMBULATORY_CARE_PROVIDER_SITE_OTHER): Payer: Medicare Other | Admitting: Family Medicine

## 2018-03-08 ENCOUNTER — Encounter: Payer: Self-pay | Admitting: Family Medicine

## 2018-03-08 VITALS — BP 132/88 | HR 82 | Resp 12 | Ht 63.0 in | Wt 226.0 lb

## 2018-03-08 DIAGNOSIS — F3341 Major depressive disorder, recurrent, in partial remission: Secondary | ICD-10-CM | POA: Diagnosis not present

## 2018-03-08 DIAGNOSIS — E669 Obesity, unspecified: Secondary | ICD-10-CM

## 2018-03-08 DIAGNOSIS — N62 Hypertrophy of breast: Secondary | ICD-10-CM

## 2018-03-08 DIAGNOSIS — E1169 Type 2 diabetes mellitus with other specified complication: Secondary | ICD-10-CM

## 2018-03-08 DIAGNOSIS — M25561 Pain in right knee: Secondary | ICD-10-CM

## 2018-03-08 DIAGNOSIS — M170 Bilateral primary osteoarthritis of knee: Secondary | ICD-10-CM

## 2018-03-08 DIAGNOSIS — I10 Essential (primary) hypertension: Secondary | ICD-10-CM | POA: Diagnosis not present

## 2018-03-08 HISTORY — DX: Hypertrophy of breast: N62

## 2018-03-08 HISTORY — DX: Obesity, unspecified: E66.9

## 2018-03-08 MED ORDER — PHENTERMINE HCL 37.5 MG PO TABS: 37.5000 mg | ORAL_TABLET | Freq: Every day | ORAL | 1 refills | Status: DC

## 2018-03-08 MED ORDER — SPIRONOLACTONE-HCTZ 25-25 MG PO TABS: 1.0000 | ORAL_TABLET | Freq: Every day | ORAL | 5 refills | Status: DC

## 2018-03-08 MED ORDER — KETOROLAC TROMETHAMINE 60 MG/2ML IM SOLN
60.0000 mg | Freq: Once | INTRAMUSCULAR | Status: AC
  Administered 2018-03-08: 60 mg via INTRAMUSCULAR

## 2018-03-08 MED ORDER — METHYLPREDNISOLONE ACETATE 80 MG/ML IJ SUSP
80.0000 mg | Freq: Once | INTRAMUSCULAR | Status: AC
  Administered 2018-03-08: 80 mg via INTRAMUSCULAR

## 2018-03-08 MED ORDER — PREDNISONE 10 MG PO TABS: 10.0000 mg | ORAL_TABLET | Freq: Two times a day (BID) | ORAL | 0 refills | Status: AC

## 2018-03-08 NOTE — Patient Instructions (Addendum)
F/U in 2  months , call if you need me before  New for blood pressure is spironolactone / hCTZ , I think you m,ay be allergic to your current medication causing the dry cough  Injections given today for right knee painm and you have a 5 day course of prednisone prescribed  You will be referred to plastic surgery per your request  Lab today, hBA1C, chem 7 and eGFR Resume phentermine one daily  Please follow through with colonoscopy   Think about what you will eat, plan ahead. Choose " clean, green, fresh or frozen" over canned, processed or packaged foods which are more sugary, salty and fatty. 70 to 75% of food eaten should be vegetables and fruit. Three meals at set times with snacks allowed between meals, but they must be fruit or vegetables. Aim to eat over a 12 hour period , example 7 am to 7 pm, and STOP after  your last meal of the day. Drink water,generally about 64 ounces per day, no other drink is as healthy. Fruit juice is best enjoyed in a healthy way, by EATING the fruit.  Thank you  for choosing [facility]. We consider it a privelige to serve you.  Delivering excellent health care in a caring and  compassionate way is our goal.  Partnering with you,  so that together we can achieve this goal is our strategy.

## 2018-03-08 NOTE — Progress Notes (Signed)
PERI Tami Pearson     MRN: [id number]      DOB: 1965-07-02   HPI Tami Pearson is here for follow up and re-evaluation of chronic medical conditions, medication management and review of any available recent lab and radiology data.  Preventive health is updated, specifically  Cancer screening and Immunization.   Requests referral to plastics due to gynecomastia, and excess fat on arms and between thighs Tickle in throat and dry cough x 3 days, no fever or chills C/o right knee pain x 1 week rated at 8 , wants treatment  ROS Denies recent fever or chills. Denies sinus pressure, nasal congestion, ear pain  Denies chest congestion, productive cough or wheezing. Denies chest pains, palpitations and leg swelling Denies abdominal pain, nausea, vomiting,diarrhea or constipation.   Denies dysuria, frequency, hesitancy or incontinence.  Denies headaches, seizures, numbness, or tingling. C/o  depression, anxiety or insomnia. Denies skin break down or rash.   PE  BP 132/88   Pulse 82   Resp 12   Ht 5\' 3"  (1.6 m)   Wt 226 lb (102.5 kg)   LMP 03/18/2013   SpO2 98% Comment: room air  BMI 40.03 kg/m   Patient alert and oriented and in no cardiopulmonary distress.  HEENT: No facial asymmetry, EOMI,   oropharynx pink and moist. No erythema or exudate Neck supple no JVD, no mass.  Chest: Clear to auscultation bilaterally. Breasts: gynecomastia, wears 2 bras for support and shoulders are dark and indented where straps are CVS: S1, S2 no murmurs, no S3.Regular rate.  ABD: Soft non tender.   Ext: No edema  MS: Adequate ROM spine, shoulders, hips and markedly  reduced in right knee.  Skin: Intact, no ulcerations or rash noted.excess fat undeer both arms and between thighs  Psych: Good eye contact, normal affect. Memory intact not anxious or depressed appearing.  CNS: CN 2-12 intact, power,  normal throughout.no focal deficits noted.   Assessment & Plan  Essential  hypertension Controlled but cough may be ACE allergy, change to spironolactone, review in 2 months DASH diet and commitment to daily physical activity for a minimum of 30 minutes discussed and encouraged, as a part of hypertension management. The importance of attaining a healthy weight is also discussed.  BP/Weight 03/08/2018 11/21/2017 11/03/2017 10/18/2017 07/14/2017 03/07/2017 01/06/2017  Systolic BP 132 128 130 140 124 108 114  Diastolic BP 88 78 88 88 84 74 82  Wt. (Lbs) 226 217 210.12 212 218 208 207  BMI 40.03 37.84 36.64 37.55 38.62 36.85 36.67  Some encounter information is confidential and restricted. Go to Review Flowsheets activity to see all data.       Osteoarthritis of both knees Currently experiencing acute right knee flare x 1 week Uncontrolled.Toradol and depo medrol administered IM in the office , to be followed by a short course of oral prednisone , weight loss encouraged.   Morbid obesity Deteriorated.resume daily phentermine and mindful eating Patient re-educated about  the importance of commitment to a  minimum of 150 minutes of exercise per week.  The importance of healthy food choices with portion control discussed. Encouraged to start a food diary, count calories and to consider  joining a support group. Sample diet sheets offered. Goals set by the patient for the next several months.   Weight /BMI 03/08/2018 11/21/2017 11/03/2017  WEIGHT 226 lb 217 lb 210 lb 1.9 oz  HEIGHT 5\' 3"  5' 3.5" 5' 3.5"  BMI 40.03 kg/m2 37.84 kg/m2 36.64 kg/m2  Some encounter information is confidential and restricted. Go to Review Flowsheets activity to see all data.      Major depression in partial remission (HCC) pHQ 9 scoreof 11, being treated by Psych wil;l message , also notify pharmacy to have cymbalta and hydroxyzine prescribed by psych  Gynecomastia Pt reports and does wear 2 bras  For breast support, c/o bilateral shoulder pain and skin changes related to enlarged breasts,  requests plastics eval  Excessive subcutaneous fat C/o thigh irritation from excess fat , and also excess fat under arms, wants plastic eval, she is referred

## 2018-03-08 NOTE — Assessment & Plan Note (Signed)
C/o thigh irritation from excess fat , and also excess fat under arms, wants plastic eval, she is referred

## 2018-03-08 NOTE — Assessment & Plan Note (Signed)
Controlled but cough may be ACE allergy, change to spironolactone, review in 2 months DASH diet and commitment to daily physical activity for a minimum of 30 minutes discussed and encouraged, as a part of hypertension management. The importance of attaining a healthy weight is also discussed.  BP/Weight 03/08/2018 11/21/2017 11/03/2017 10/18/2017 07/14/2017 03/07/2017 01/06/2017  Systolic BP 132 128 130 140 124 108 114  Diastolic BP 88 78 88 88 84 74 82  Wt. (Lbs) 226 217 210.12 212 218 208 207  BMI 40.03 37.84 36.64 37.55 38.62 36.85 36.67  Some encounter information is confidential and restricted. Go to Review Flowsheets activity to see all data.

## 2018-03-08 NOTE — Assessment & Plan Note (Signed)
Pt reports and does wear 2 bras  For breast support, c/o bilateral shoulder pain and skin changes related to enlarged breasts, requests plastics eval

## 2018-03-08 NOTE — Assessment & Plan Note (Signed)
pHQ 9 scoreof 11, being treated by Psych wil;l message , also notify pharmacy to have cymbalta and hydroxyzine prescribed by psych

## 2018-03-08 NOTE — Assessment & Plan Note (Addendum)
Deteriorated.resume daily phentermine and mindful eating Patient re-educated about  the importance of commitment to a  minimum of 150 minutes of exercise per week.  The importance of healthy food choices with portion control discussed. Encouraged to start a food diary, count calories and to consider  joining a support group. Sample diet sheets offered. Goals set by the patient for the next several months.   Weight /BMI 03/08/2018 11/21/2017 11/03/2017  WEIGHT 226 lb 217 lb 210 lb 1.9 oz  HEIGHT 5\' 3"  5' 3.5" 5' 3.5"  BMI 40.03 kg/m2 37.84 kg/m2 36.64 kg/m2  Some encounter information is confidential and restricted. Go to Review Flowsheets activity to see all data.

## 2018-03-08 NOTE — Assessment & Plan Note (Signed)
Currently experiencing acute right knee flare x 1 week Uncontrolled.Toradol and depo medrol administered IM in the office , to be followed by a short course of oral prednisone , weight loss encouraged.

## 2018-03-09 LAB — BASIC METABOLIC PANEL WITH GFR
BUN / CREAT RATIO: 28 (calc) — AB (ref 6–22)
BUN: 29 mg/dL — ABNORMAL HIGH (ref 7–25)
CO2: 29 mmol/L (ref 20–32)
CREATININE: 1.02 mg/dL (ref 0.50–1.05)
Calcium: 9.5 mg/dL (ref 8.6–10.4)
Chloride: 104 mmol/L (ref 98–110)
GFR, Est African American: 73 mL/min/{1.73_m2} (ref >=60)
GFR, Est Non African American: 63 mL/min/{1.73_m2} (ref >=60)
Glucose, Bld: 85 mg/dL (ref 65–139)
Potassium: 4.3 mmol/L (ref 3.5–5.3)
SODIUM: 141 mmol/L (ref 135–146)

## 2018-03-09 LAB — HEMOGLOBIN A1C
HEMOGLOBIN A1C: 6 %{Hb} — AB (ref <5.7)
MEAN PLASMA GLUCOSE: 126 (calc)
eAG (mmol/L): 7 (calc)

## 2018-03-15 ENCOUNTER — Ambulatory Visit (HOSPITAL_COMMUNITY): Payer: Medicare Other | Admitting: Psychiatry

## 2018-04-10 ENCOUNTER — Ambulatory Visit (HOSPITAL_COMMUNITY): Payer: Medicare Other | Admitting: Psychiatry

## 2018-04-14 ENCOUNTER — Other Ambulatory Visit: Payer: Self-pay

## 2018-04-14 ENCOUNTER — Encounter (HOSPITAL_COMMUNITY): Payer: Self-pay | Admitting: Psychiatry

## 2018-04-14 ENCOUNTER — Ambulatory Visit (INDEPENDENT_AMBULATORY_CARE_PROVIDER_SITE_OTHER): Payer: Medicare Other | Admitting: Psychiatry

## 2018-04-14 VITALS — BP 138/90 | HR 68 | Ht 63.25 in | Wt 230.0 lb

## 2018-04-14 DIAGNOSIS — F331 Major depressive disorder, recurrent, moderate: Secondary | ICD-10-CM

## 2018-04-14 DIAGNOSIS — Z79899 Other long term (current) drug therapy: Secondary | ICD-10-CM

## 2018-04-14 MED ORDER — TRAZODONE HCL 50 MG PO TABS: 50.0000 mg | ORAL_TABLET | Freq: Every day | ORAL | 2 refills | Status: DC

## 2018-04-14 MED ORDER — DULOXETINE HCL 60 MG PO CPEP: 60.0000 mg | ORAL_CAPSULE | Freq: Two times a day (BID) | ORAL | 5 refills | Status: DC

## 2018-04-14 MED ORDER — ARIPIPRAZOLE 2 MG PO TABS: 2.0000 mg | ORAL_TABLET | Freq: Every day | ORAL | 2 refills | Status: DC
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
## 2018-05-31 ENCOUNTER — Other Ambulatory Visit: Payer: Self-pay

## 2018-05-31 ENCOUNTER — Ambulatory Visit: Payer: Self-pay | Admitting: Family Medicine

## 2018-05-31 ENCOUNTER — Ambulatory Visit (INDEPENDENT_AMBULATORY_CARE_PROVIDER_SITE_OTHER): Payer: Medicare Other | Admitting: Family Medicine

## 2018-05-31 ENCOUNTER — Encounter: Payer: Self-pay | Admitting: Family Medicine

## 2018-05-31 VITALS — BP 132/88 | Ht 63.0 in | Wt 226.0 lb

## 2018-05-31 DIAGNOSIS — Z1211 Encounter for screening for malignant neoplasm of colon: Secondary | ICD-10-CM | POA: Diagnosis not present

## 2018-05-31 DIAGNOSIS — E669 Obesity, unspecified: Secondary | ICD-10-CM

## 2018-05-31 DIAGNOSIS — I10 Essential (primary) hypertension: Secondary | ICD-10-CM

## 2018-05-31 DIAGNOSIS — F3341 Major depressive disorder, recurrent, in partial remission: Secondary | ICD-10-CM | POA: Diagnosis not present

## 2018-05-31 DIAGNOSIS — Z1159 Encounter for screening for other viral diseases: Secondary | ICD-10-CM | POA: Diagnosis not present

## 2018-05-31 DIAGNOSIS — G8929 Other chronic pain: Secondary | ICD-10-CM

## 2018-05-31 DIAGNOSIS — M549 Dorsalgia, unspecified: Secondary | ICD-10-CM

## 2018-05-31 DIAGNOSIS — E785 Hyperlipidemia, unspecified: Secondary | ICD-10-CM | POA: Diagnosis not present

## 2018-05-31 DIAGNOSIS — E1169 Type 2 diabetes mellitus with other specified complication: Secondary | ICD-10-CM | POA: Diagnosis not present

## 2018-05-31 DIAGNOSIS — E559 Vitamin D deficiency, unspecified: Secondary | ICD-10-CM | POA: Diagnosis not present

## 2018-05-31 DIAGNOSIS — M25562 Pain in left knee: Secondary | ICD-10-CM | POA: Diagnosis not present

## 2018-05-31 MED ORDER — SPIRONOLACTONE-HCTZ 25-25 MG PO TABS: 1.0000 | ORAL_TABLET | Freq: Every day | ORAL | 5 refills | Status: DC

## 2018-05-31 MED ORDER — GABAPENTIN 400 MG PO CAPS: ORAL_CAPSULE | ORAL | 1 refills | Status: DC

## 2018-05-31 MED ORDER — HYDROXYZINE HCL 50 MG PO TABS: ORAL_TABLET | ORAL | 1 refills | Status: DC

## 2018-05-31 NOTE — Assessment & Plan Note (Addendum)
Tami Pearson is reminded of the importance of commitment to daily physical activity for 30 minutes or more, as able and the need to limit carbohydrate intake to 30 to 60 grams per meal to help with blood sugar control.   Updated lab needed at/ before next visit.   Tami Pearson is reminded of the importance of daily foot exam, annual eye examination, and good blood sugar, blood pressure and cholesterol control.  Diabetic Labs Latest Ref Rng & Units 03/08/2018 10/18/2017 03/07/2017 01/06/2017 05/10/2016  HbA1c <5.7 % of total Hgb 6.0(H) 5.9(H) - 5.8(H) 5.6  Microalbumin Not estab mg/dL - - - - -  Micro/Creat Ratio <30 mcg/mg creat - - - - -  Chol <200 mg/dL - 962 - 952 841  HDL >32 mg/dL - 60 - 56 44(W)  Calc LDL mg/dL (calc) - 102(V) - 253(G) 87  Triglycerides <150 mg/dL - 87 - 86 59  Creatinine 0.50 - 1.05 mg/dL 6.44 0.34 7.42(V) 9.56(L) 0.96   BP/Weight 05/31/2018 03/08/2018 11/21/2017 11/03/2017 10/18/2017 07/14/2017 03/07/2017  Systolic BP 132 132 128 130 140 124 108  Diastolic BP 88 88 78 88 88 84 74  Wt. (Lbs) 226 226 217 210.12 212 218 208  BMI 40.03 40.03 37.84 36.64 37.55 38.62 36.85  Some encounter information is confidential and restricted. Go to Review Flowsheets activity to see all data.   Foot/eye exam completion dates 06/03/2015 04/16/2015  Foot Form Completion Done Done

## 2018-05-31 NOTE — Assessment & Plan Note (Signed)
Unchanged Patient re-educated about  the importance of commitment to a  minimum of 150 minutes of exercise per week as able.  The importance of healthy food choices with portion control discussed, as well as eating regularly and within a 12 hour window most days. The need to choose "clean , green" food 50 to 75% of the time is discussed, as well as to make water the primary drink and set a goal of 64 ounces water daily.     Weight /BMI 05/31/2018 03/08/2018 11/21/2017  WEIGHT 226 lb 226 lb 217 lb  HEIGHT 5\' 3"  5\' 3"  5' 3.5"  BMI 40.03 kg/m2 40.03 kg/m2 37.84 kg/m2  Some encounter information is confidential and restricted. Go to Review Flowsheets activity to see all data.

## 2018-05-31 NOTE — Assessment & Plan Note (Signed)
Managed by Psych, not adequately controled, she will continue with Psychiatry and therapy

## 2018-05-31 NOTE — Progress Notes (Signed)
Virtual Visit via Telephone Note  I connected with Tami Pearson on 05/31/18 at  3:00 PM EDT by telephone and verified that I am speaking with the correct person using two identifiers.  Location: Patient: in car Provider: in the office   I discussed the limitations, risks, security and privacy concerns of performing an evaluation and management service by telephone and the availability of in person appointments. I also discussed with the patient that there may be a patient responsible charge related to this service. The patient expressed understanding and agreed to proceed. Left leg gave out and she fell 3 days ago when the knee gave Out , surgery on the knee had been recommended since last Summer, she has been trying to attain an acceptable weight to get the surgery History of Present Illness: Denies recent fever or chills. Denies sinus pressure, nasal congestion, ear pain or sore throat. Denies chest congestion, productive cough or wheezing. Denies chest pains, palpitations and leg swelling Denies abdominal pain, nausea, vomiting,diarrhea or constipation.   Denies dysuria, frequency, hesitancy or incontinence. C/o joint pain, swelling and limitation in mobility.recent fall when left knee gave out Denies headaches, seizures, numbness, or tingling. Denies uncontrolled depression, anxiety or insomnia. Denies skin break down or rash.       Observations/Objective: BP 132/88   Ht 5\' 3"  (1.6 m)   Wt 226 lb (102.5 kg)   LMP 03/18/2013   BMI 40.03 kg/m  Good communication with no confusion and intact memory. Alert and oriented x 3 No signs of respiratory distress during sppech    Assessment and Plan:  Essential hypertension Controlled, no change in medication DASH diet and commitment to daily physical activity for a minimum of 30 minutes discussed and encouraged, as a part of hypertension management. The importance of attaining a healthy weight is also  discussed.  BP/Weight 05/31/2018 03/08/2018 11/21/2017 11/03/2017 10/18/2017 07/14/2017 03/07/2017  Systolic BP 132 132 128 130 140 124 108  Diastolic BP 88 88 78 88 88 84 74  Wt. (Lbs) 226 226 217 210.12 212 218 208  BMI 40.03 40.03 37.84 36.64 37.55 38.62 36.85  Some encounter information is confidential and restricted. Go to Review Flowsheets activity to see all data.       Left knee pain Worsened pain and instabiity , recent fall occurred I in the past 3 days, when the knee " gave out", refer to Ortho  Diabetes mellitus type 2 in obese [facility]HCC) Tami Pearson is reminded of the importance of commitment to daily physical activity for 30 minutes or more, as able and the need to limit carbohydrate intake to 30 to 60 grams per meal to help with blood sugar control.   Updated lab needed at/ before next visit.   Tami Pearson is reminded of the importance of daily foot exam, annual eye examination, and good blood sugar, blood pressure and cholesterol control.  Diabetic Labs Latest Ref Rng & Units 03/08/2018 10/18/2017 03/07/2017 01/06/2017 05/10/2016  HbA1c <5.7 % of total Hgb 6.0(H) 5.9(H) - 5.8(H) 5.6  Microalbumin Not estab mg/dL - - - - -  Micro/Creat Ratio <30 mcg/mg creat - - - - -  Chol <200 mg/dL - 696196 - 295174 284146  HDL >13>50 mg/dL - 60 - 56 24(M47(L)  Calc LDL mg/dL (calc) - 010(U117(H) - 725(D100(H) 87  Triglycerides <150 mg/dL - 87 - 86 59  Creatinine 0.50 - 1.05 mg/dL 6.641.02 4.030.94 4.74(Q1.07(H) 5.95(G1.12(H) 0.96   BP/Weight 05/31/2018 03/08/2018 11/21/2017 11/03/2017 10/18/2017 07/14/2017 03/07/2017  Systolic BP 132 132 128 130 140 124 108  Diastolic BP 88 88 78 88 88 84 74  Wt. (Lbs) 226 226 217 210.12 212 218 208  BMI 40.03 40.03 37.84 36.64 37.55 38.62 36.85  Some encounter information is confidential and restricted. Go to Review Flowsheets activity to see all data.   Foot/eye exam completion dates 06/03/2015 04/16/2015  Foot Form Completion Done Done        Major depression in partial remission (HCC) Managed by Psych,  not adequately controled, she will continue with Psychiatry and therapy  Morbid obesity Unchanged Patient re-educated about  the importance of commitment to a  minimum of 150 minutes of exercise per week as able.  The importance of healthy food choices with portion control discussed, as well as eating regularly and within a 12 hour window most days. The need to choose "clean , green" food 50 to 75% of the time is discussed, as well as to make water the primary drink and set a goal of 64 ounces water daily.     Weight /BMI 05/31/2018 03/08/2018 11/21/2017  WEIGHT 226 lb 226 lb 217 lb  HEIGHT 5\' 3"  5\' 3"  5' 3.5"  BMI 40.03 kg/m2 40.03 kg/m2 37.84 kg/m2  Some encounter information is confidential and restricted. Go to Review Flowsheets activity to see all data.       Follow Up Instructions:    I discussed the assessment and treatment plan with the patient. The patient was provided an opportunity to ask questions and all were answered. The patient agreed with the plan and demonstrated an understanding of the instructions.   The patient was advised to call back or seek an in-person evaluation if the symptoms worsen or if the condition fails to improve as anticipated.  I provided 25 minutes of non-face-to-face time during this encounter.   Syliva Overman, MD

## 2018-05-31 NOTE — Assessment & Plan Note (Signed)
Worsened pain and instabiity , recent fall occurred I in the past 3 days, when the knee " gave out", refer to Ortho

## 2018-05-31 NOTE — Patient Instructions (Addendum)
F/U in 2 months in the office,  call if you need me before  You are being referred to [facility] center for mammogram and appt will be mailed  I will refer you to dr Oneida Alar for colonoscopy and to Dr Aline Brochure re left knee instability with recent fall  Please get fasting lipid, cmp and EGFR, hBA1C, Hep C , vit D and CBC 1 week before next visit if possible   Think about what you will eat, plan ahead. Choose " clean, green, fresh or frozen" over canned, processed or packaged foods which are more sugary, salty and fatty. 70 to 75% of food eaten should be vegetables and fruit. Three meals at set times with snacks allowed between meals, but they must be fruit or vegetables. Aim to eat over a 12 hour period , example 7 am to 7 pm, and STOP after  your last meal of the day. Drink water,generally about 64 ounces per day, no other drink is as healthy. Fruit juice is best enjoyed in a healthy way, by EATING the fruit. It is important that you exercise regularly at least 30 minutes 5 times a week. If you develop chest pain, have severe difficulty breathing, or feel very tired, stop exercising immediately and seek medical attention    Social distancing. Frequent hand washing with soap and water Keeping your hands off of your face.wear a face mask outside of your home These 3 practices will help to keep both you and your community healthy during this time. Please practice them faithfully!  Thanks for choosing [facility], we consider it a privelige to serve you.

## 2018-05-31 NOTE — Assessment & Plan Note (Signed)
Controlled, no change in medication DASH diet and commitment to daily physical activity for a minimum of 30 minutes discussed and encouraged, as a part of hypertension management. The importance of attaining a healthy weight is also discussed.  BP/Weight 05/31/2018 03/08/2018 11/21/2017 11/03/2017 10/18/2017 07/14/2017 03/07/2017  Systolic BP 132 132 128 130 140 124 108  Diastolic BP 88 88 78 88 88 84 74  Wt. (Lbs) 226 226 217 210.12 212 218 208  BMI 40.03 40.03 37.84 36.64 37.55 38.62 36.85  Some encounter information is confidential and restricted. Go to Review Flowsheets activity to see all data.

## 2018-06-01 ENCOUNTER — Encounter: Payer: Self-pay | Admitting: Family Medicine

## 2018-06-01 ENCOUNTER — Ambulatory Visit (INDEPENDENT_AMBULATORY_CARE_PROVIDER_SITE_OTHER): Payer: Medicare Other | Admitting: Family Medicine

## 2018-06-01 ENCOUNTER — Other Ambulatory Visit: Payer: Self-pay

## 2018-06-01 DIAGNOSIS — Z0000 Encounter for general adult medical examination without abnormal findings: Secondary | ICD-10-CM
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
## 2018-06-01 NOTE — Patient Instructions (Signed)
[name] , Thank you for taking time to come for your Medicare Wellness Visit. I appreciate your ongoing commitment to your health goals. Please review the following plan we discussed and let me know if I can assist you in the future.   Screening recommendations/referrals: Colonoscopy: Please schedule this, if you need assistance let us know Mammogram: Please schedule this as well. Bone Density: In a few years, Dr Moshe Cipro will order when needed Recommended yearly ophthalmology/optometry visit for glaucoma screening and checkup Recommended yearly dental visit for hygiene and checkup  Vaccinations: Influenza vaccine: Due in Fall of 2020 Pneumococcal vaccine: Completed one  Tdap vaccine: Up to date Shingles vaccine: Dr Moshe Cipro will order when needed  Preventive Care 40-64 Years, Female Preventive care refers to lifestyle choices and visits with your health care provider that can promote health and wellness. What does preventive care include?  A yearly physical exam. This is also called an annual well check.  Dental exams once or twice a year.  Routine eye exams. Ask your health care provider how often you should have your eyes checked.  Personal lifestyle choices, including:  Daily care of your teeth and gums.  Regular physical activity.  Eating a healthy diet.  Avoiding tobacco and drug use.  Limiting alcohol use.  Practicing safe sex.  Taking low-dose aspirin daily starting at age 68.  Taking vitamin and mineral supplements as recommended by your health care provider. What happens during an annual well check? The services and screenings done by your health care provider during your annual well check will depend on your age, overall health, lifestyle risk factors, and family history of disease. Counseling  Your health care provider may ask you questions about your:  Alcohol use.  Tobacco use.  Drug use.  Emotional well-being.  Home and relationship well-being.   Sexual activity.  Eating habits.  Work and work Statistician.  Method of birth control.  Menstrual cycle.  Pregnancy history. Screening  You may have the following tests or measurements:  Height, weight, and BMI.  Blood pressure.  Lipid and cholesterol levels. These may be checked every 5 years, or more frequently if you are over 62 years old.  Skin check.  Lung cancer screening. You may have this screening every year starting at age 24 if you have a 30-pack-year history of smoking and currently smoke or have quit within the past 15 years.  Fecal occult blood test (FOBT) of the stool. You may have this test every year starting at age 64.  Flexible sigmoidoscopy or colonoscopy. You may have a sigmoidoscopy every 5 years or a colonoscopy every 10 years starting at age 63.  Hepatitis C blood test.  Hepatitis B blood test.  Sexually transmitted disease (STD) testing.  Diabetes screening. This is done by checking your blood sugar (glucose) after you have not eaten for a while (fasting). You may have this done every 1-3 years.  Mammogram. This may be done every 1-2 years. Talk to your health care provider about when you should start having regular mammograms. This may depend on whether you have a family history of breast cancer.  BRCA-related cancer screening. This may be done if you have a family history of breast, ovarian, tubal, or peritoneal cancers.  Pelvic exam and Pap test. This may be done every 3 years starting at age 61. Starting at age 29, this may be done every 5 years if you have a Pap test in combination with an HPV test.  Bone density  scan. This is done to screen for osteoporosis. You may have this scan if you are at high risk for osteoporosis. Discuss your test results, treatment options, and if necessary, the need for more tests with your health care provider. Vaccines  Your health care provider may recommend certain vaccines, such as:  Influenza vaccine.  This is recommended every year.  Tetanus, diphtheria, and acellular pertussis (Tdap, Td) vaccine. You may need a Td booster every 10 years.  Zoster vaccine. You may need this after age 72.  Pneumococcal 13-valent conjugate (PCV13) vaccine. You may need this if you have certain conditions and were not previously vaccinated.  Pneumococcal polysaccharide (PPSV23) vaccine. You may need one or two doses if you smoke cigarettes or if you have certain conditions. Talk to your health care provider about which screenings and vaccines you need and how often you need them. This information is not intended to replace advice given to you by your health care provider. Make sure you discuss any questions you have with your health care provider. Document Released: 02/14/2015 Document Revised: 10/08/2015 Document Reviewed: 11/19/2014 Elsevier Interactive Patient Education  2017 [place] Prevention in the Home Falls can cause injuries. They can happen to people of all ages. There are many things you can do to make your home safe and to help prevent falls. What can I do on the outside of my home?  Regularly fix the edges of walkways and driveways and fix any cracks.  Remove anything that might make you trip as you walk through a door, such as a raised step or threshold.  Trim any bushes or trees on the path to your home.  Use bright outdoor lighting.  Clear any walking paths of anything that might make someone trip, such as rocks or tools.  Regularly check to see if handrails are loose or broken. Make sure that both sides of any steps have handrails.  Any raised decks and porches should have guardrails on the edges.  Have any leaves, snow, or ice cleared regularly.  Use sand or salt on walking paths during winter.  Clean up any spills in your garage right away. This includes oil or grease spills. What can I do in the bathroom?  Use night lights.  Install grab bars by the toilet  and in the tub and shower. Do not use towel bars as grab bars.  Use non-skid mats or decals in the tub or shower.  If you need to sit down in the shower, use a plastic, non-slip stool.  Keep the floor dry. Clean up any water that spills on the floor as soon as it happens.  Remove soap buildup in the tub or shower regularly.  Attach bath mats securely with double-sided non-slip rug tape.  Do not have throw rugs and other things on the floor that can make you trip. What can I do in the bedroom?  Use night lights.  Make sure that you have a light by your bed that is easy to reach.  Do not use any sheets or blankets that are too big for your bed. They should not hang down onto the floor.  Have a firm chair that has side arms. You can use this for support while you get dressed.  Do not have throw rugs and other things on the floor that can make you trip. What can I do in the kitchen?  Clean up any spills right away.  Avoid walking on wet floors.  Keep items that you use a lot in easy-to-reach places.  If you need to reach something above you, use a strong step stool that has a grab bar.  Keep electrical cords out of the way.  Do not use floor polish or wax that makes floors slippery. If you must use wax, use non-skid floor wax.  Do not have throw rugs and other things on the floor that can make you trip. What can I do with my stairs?  Do not leave any items on the stairs.  Make sure that there are handrails on both sides of the stairs and use them. Fix handrails that are broken or loose. Make sure that handrails are as long as the stairways.  Check any carpeting to make sure that it is firmly attached to the stairs. Fix any carpet that is loose or worn.  Avoid having throw rugs at the top or bottom of the stairs. If you do have throw rugs, attach them to the floor with carpet tape.  Make sure that you have a light switch at the top of the stairs and the bottom of the  stairs. If you do not have them, ask someone to add them for you. What else can I do to help prevent falls?  Wear shoes that:  Do not have high heels.  Have rubber bottoms.  Are comfortable and fit you well.  Are closed at the toe. Do not wear sandals.  If you use a stepladder:  Make sure that it is fully opened. Do not climb a closed stepladder.  Make sure that both sides of the stepladder are locked into place.  Ask someone to hold it for you, if possible.  Clearly mark and make sure that you can see:  Any grab bars or handrails.  First and last steps.  Where the edge of each step is.  Use tools that help you move around (mobility aids) if they are needed. These include:  Canes.  Walkers.  Scooters.  Crutches.  Turn on the lights when you go into a dark area. Replace any light bulbs as soon as they burn out.  Set up your furniture so you have a clear path. Avoid moving your furniture around.  If any of your floors are uneven, fix them.  If there are any pets around you, be aware of where they are.  Review your medicines with your doctor. Some medicines can make you feel dizzy. This can increase your chance of falling. Ask your doctor what other things that you can do to help prevent falls. This information is not intended to replace advice given to you by your health care provider. Make sure you discuss any questions you have with your health care provider. Document Released: 11/14/2008 Document Revised: 06/26/2015 Document Reviewed: 02/22/2014 Elsevier Interactive Patient Education  2017 [employer]   

## 2018-06-14 ENCOUNTER — Ambulatory Visit: Payer: Medicare Other | Admitting: Orthopedic Surgery

## 2018-06-16 ENCOUNTER — Ambulatory Visit: Payer: Medicare Other | Admitting: Orthopedic Surgery

## 2018-06-19 ENCOUNTER — Encounter: Payer: Self-pay | Admitting: Orthopedic Surgery

## 2018-06-27 ENCOUNTER — Telehealth: Payer: Self-pay | Admitting: *Deleted

## 2018-06-27 ENCOUNTER — Ambulatory Visit: Payer: Medicare Other | Admitting: Gastroenterology

## 2018-06-27 ENCOUNTER — Other Ambulatory Visit: Payer: Self-pay

## 2018-06-27 NOTE — Telephone Encounter (Signed)
Called patient and had to [facility] for virtual visit with LSL today. Patient did not return call back.

## 2018-06-28 NOTE — Telephone Encounter (Signed)
NO SHOWED AND LETTER SENT  °

## 2018-06-29 ENCOUNTER — Encounter: Payer: Self-pay | Admitting: *Deleted

## 2018-07-18 ENCOUNTER — Other Ambulatory Visit: Payer: Self-pay

## 2018-07-18 ENCOUNTER — Ambulatory Visit (INDEPENDENT_AMBULATORY_CARE_PROVIDER_SITE_OTHER): Payer: Medicare Other | Admitting: Family Medicine

## 2018-07-18 ENCOUNTER — Encounter: Payer: Self-pay | Admitting: Family Medicine

## 2018-07-18 ENCOUNTER — Encounter (INDEPENDENT_AMBULATORY_CARE_PROVIDER_SITE_OTHER): Payer: Self-pay

## 2018-07-18 VITALS — BP 132/88 | Ht 63.0 in | Wt 226.0 lb

## 2018-07-18 DIAGNOSIS — R7302 Impaired glucose tolerance (oral): Secondary | ICD-10-CM

## 2018-07-18 DIAGNOSIS — E785 Hyperlipidemia, unspecified: Secondary | ICD-10-CM | POA: Diagnosis not present

## 2018-07-18 DIAGNOSIS — I10 Essential (primary) hypertension: Secondary | ICD-10-CM

## 2018-07-18 DIAGNOSIS — E1169 Type 2 diabetes mellitus with other specified complication: Secondary | ICD-10-CM

## 2018-07-18 DIAGNOSIS — F3341 Major depressive disorder, recurrent, in partial remission: Secondary | ICD-10-CM

## 2018-07-18 DIAGNOSIS — E669 Obesity, unspecified: Secondary | ICD-10-CM | POA: Diagnosis not present

## 2018-07-18 MED ORDER — PHENTERMINE HCL 37.5 MG PO TABS: 37.5000 mg | ORAL_TABLET | Freq: Every day | ORAL | 2 refills | Status: DC

## 2018-07-18 MED ORDER — METFORMIN HCL 500 MG PO TABS: 500.0000 mg | ORAL_TABLET | Freq: Every day | ORAL | 0 refills | Status: DC

## 2018-07-18 NOTE — Patient Instructions (Addendum)
F/u with MD in 3 months, call if you need me sooner  New to help with blood sugar and weight loss is metformin 1 daily   Continue phentermine one daily  You DO NEED mental health help and I have sent a message to Dr Harrington Challenger, therapy is indicated for you. Please reach out for help if you become too overwhelmed and feel like hurting yourself or anyone, you have stated that you will do this  It is important that you exercise regularly at least 30 minutes 5 times a week. If you develop chest pain, have severe difficulty breathing, or feel very tired, stop exercising immediately and seek medical attention  Social distancing. Frequent hand washing with soap and water Keeping your hands off of your face. These 3 practices will help to keep both you and your community healthy during this time. Please practice them faithfully!   Thanks for choosing [facility], we consider it a privelige to serve you.

## 2018-07-18 NOTE — Progress Notes (Signed)
Virtual Visit via Telephone Note  I connected with Tami Pearson on 07/18/18 at  3:40 PM EDT by telephone and verified that I am speaking with the correct person using two identifiers.  Location: Patient: home  Provider: office   I discussed the limitations, risks, security and privacy concerns of performing an evaluation and management service by telephone and the availability of in person appointments. I also discussed with the patient that there may be a patient responsible charge related to this service. The patient expressed understanding and agreed to proceed.   History of Present Illness: F/U chronic problems C/o increased and uncontrolled back and knee pain , hoping for surgery despite weight gain C/o depression and anxiety about excessive bills Denies recent fever or chills. Denies sinus pressure, nasal congestion, ear pain or sore throat. Denies chest congestion, productive cough or wheezing. Denies chest pains, palpitations and leg swelling Denies abdominal pain, nausea, vomiting,diarrhea or constipation.   Denies dysuria, frequency, hesitancy or incontinence.  Denies headaches, seizures, numbness, or tingling. C/o wright gain , but wants to continue phentermine, has not been taking as prescribed due to lack of money Denies skin break down or rash.        Observations/Objective: BP 132/88   Ht 5\' 3"  (1.6 m)   Wt 226 lb (102.5 kg)   LMP 03/18/2013   BMI 40.03 kg/m  Good communication with no confusion and intact memory. Alert and oriented x 3 No signs of respiratory distress during speech    Assessment and Plan: Essential hypertension Controlled, no change in medication DASH diet and commitment to daily physical activity for a minimum of 30 minutes discussed and encouraged, as a part of hypertension management. The importance of attaining a healthy weight is also discussed.  BP/Weight 07/18/2018 05/31/2018 03/08/2018 11/21/2017 11/03/2017 10/18/2017  07/14/2017  Systolic BP 132 132 132 128 130 140 124  Diastolic BP 88 88 88 78 88 88 84  Wt. (Lbs) 226 226 226 217 210.12 212 218  BMI 40.03 40.03 40.03 37.84 36.64 37.55 38.62  Some encounter information is confidential and restricted. Go to Review Flowsheets activity to see all data.       Diabetes mellitus type 2 in obese (HCC) Start once daily metfomin Tami Pearson is reminded of the importance of commitment to daily physical activity for 30 minutes or more, as able and the need to limit carbohydrate intake to 30 to 60 grams per meal to help with blood sugar control.   The need to take medication as prescribed, test blood sugar as directed, and to call between visits if there is a concern that blood sugar is uncontrolled is also discussed.   Tami Pearson is reminded of the importance of daily foot exam, annual eye examination, and good blood sugar, blood pressure and cholesterol control.  Diabetic Labs Latest Ref Rng & Units 03/08/2018 10/18/2017 03/07/2017 01/06/2017 05/10/2016  HbA1c <5.7 % of total Hgb 6.0(H) 5.9(H) - 5.8(H) 5.6  Microalbumin Not estab mg/dL - - - - -  Micro/Creat Ratio <30 mcg/mg creat - - - - -  Chol <200 mg/dL - 161196 - 096174 045146  HDL >40>50 mg/dL - 60 - 56 98(J47(L)  Calc LDL mg/dL (calc) - 191(Y117(H) - 782(N100(H) 87  Triglycerides <150 mg/dL - 87 - 86 59  Creatinine 0.50 - 1.05 mg/dL 5.621.02 1.300.94 8.65(H1.07(H) 8.46(N1.12(H) 0.96   BP/Weight 07/18/2018 05/31/2018 03/08/2018 11/21/2017 11/03/2017 10/18/2017 07/14/2017  Systolic BP 132 132 132 128 130 140 124  Diastolic BP 88  88 88 78 88 88 84  Wt. (Lbs) 226 226 226 217 210.12 212 218  BMI 40.03 40.03 40.03 37.84 36.64 37.55 38.62  Some encounter information is confidential and restricted. Go to Review Flowsheets activity to see all data.   Foot/eye exam completion dates 06/03/2015 04/16/2015  Foot Form Completion Done Done        IGT (impaired glucose tolerance) Patient educated about the importance of limiting  Carbohydrate intake , the need to  commit to daily physical activity for a minimum of 30 minutes , and to commit weight loss. The fact that changes in all these areas will reduce or eliminate all together the development of diabetes is stressed.   Diabetic Labs Latest Ref Rng & Units 03/08/2018 10/18/2017 03/07/2017 01/06/2017 05/10/2016  HbA1c <5.7 % of total Hgb 6.0(H) 5.9(H) - 5.8(H) 5.6  Microalbumin Not estab mg/dL - - - - -  Micro/Creat Ratio <30 mcg/mg creat - - - - -  Chol <200 mg/dL - 196 - 174 146  HDL >50 mg/dL - 60 - 56 47(L)  Calc LDL mg/dL (calc) - 117(H) - 100(H) 87  Triglycerides <150 mg/dL - 87 - 86 59  Creatinine 0.50 - 1.05 mg/dL 1.02 0.94 1.07(H) 1.12(H) 0.96   BP/Weight 07/18/2018 05/31/2018 03/08/2018 11/21/2017 11/03/2017 10/18/2017 3/32/9518  Systolic BP 841 660 630 160 109 323 557  Diastolic BP 88 88 88 78 88 88 84  Wt. (Lbs) 226 226 226 217 210.12 212 218  BMI 40.03 40.03 40.03 37.84 36.64 37.55 38.62  Some encounter information is confidential and restricted. Go to Review Flowsheets activity to see all data.   Foot/eye exam completion dates 06/03/2015 04/16/2015  Foot Form Completion Done Done  DStart daily metformin    Hyperlipidemia LDL goal <100 Hyperlipidemia:Low fat diet discussed and encouraged.   Lipid Panel  Lab Results  Component Value Date   CHOL 196 10/18/2017   HDL 60 10/18/2017   LDLCALC 117 (H) 10/18/2017   TRIG 87 10/18/2017   CHOLHDL 3.3 10/18/2017   Needs to reduce fatty foods    Major depression in partial remission (HCC) Uncontrolled , not adequately treated, mainly economic a sress is he current trigger, not suicidal or homicidsal Management by Psych     Follow Up Instructions:    I discussed the assessment and treatment plan with the patient. The patient was provided an opportunity to ask questions and all were answered. The patient agreed with the plan and demonstrated an understanding of the instructions.   The patient was advised to call back or seek an in-person  evaluation if the symptoms worsen or if the condition fails to improve as anticipated.  I provided 25 minutes of non-face-to-face time during this encounter.   Tula Nakayama, MD

## 2018-07-23 ENCOUNTER — Encounter: Payer: Self-pay | Admitting: Family Medicine

## 2018-07-23 DIAGNOSIS — E1169 Type 2 diabetes mellitus with other specified complication: Secondary | ICD-10-CM | POA: Insufficient documentation

## 2018-07-23 DIAGNOSIS — R7302 Impaired glucose tolerance (oral): Secondary | ICD-10-CM | POA: Insufficient documentation

## 2018-07-23 NOTE — Assessment & Plan Note (Signed)
Uncontrolled , not adequately treated, mainly economic a sress is he current trigger, not suicidal or homicidsal Management by Psych

## 2018-07-23 NOTE — Assessment & Plan Note (Signed)
Start once daily metfomin Tami Pearson is reminded of the importance of commitment to daily physical activity for 30 minutes or more, as able and the need to limit carbohydrate intake to 30 to 60 grams per meal to help with blood sugar control.   The need to take medication as prescribed, test blood sugar as directed, and to call between visits if there is a concern that blood sugar is uncontrolled is also discussed.   Tami Pearson is reminded of the importance of daily foot exam, annual eye examination, and good blood sugar, blood pressure and cholesterol control.  Diabetic Labs Latest Ref Rng & Units 03/08/2018 10/18/2017 03/07/2017 01/06/2017 05/10/2016  HbA1c <5.7 % of total Hgb 6.0(H) 5.9(H) - 5.8(H) 5.6  Microalbumin Not estab mg/dL - - - - -  Micro/Creat Ratio <30 mcg/mg creat - - - - -  Chol <200 mg/dL - 196 - 174 146  HDL >50 mg/dL - 60 - 56 47(L)  Calc LDL mg/dL (calc) - 117(H) - 100(H) 87  Triglycerides <150 mg/dL - 87 - 86 59  Creatinine 0.50 - 1.05 mg/dL 1.02 0.94 1.07(H) 1.12(H) 0.96   BP/Weight 07/18/2018 05/31/2018 03/08/2018 11/21/2017 11/03/2017 10/18/2017 0/93/8182  Systolic BP 993 716 967 893 810 175 102  Diastolic BP 88 88 88 78 88 88 84  Wt. (Lbs) 226 226 226 217 210.12 212 218  BMI 40.03 40.03 40.03 37.84 36.64 37.55 38.62  Some encounter information is confidential and restricted. Go to Review Flowsheets activity to see all data.   Foot/eye exam completion dates 06/03/2015 04/16/2015  Foot Form Completion Done Done

## 2018-07-23 NOTE — Assessment & Plan Note (Signed)
  Patient re-educated about  the importance of commitment to a  minimum of 150 minutes of exercise per week as able.  The importance of healthy food choices with portion control discussed, as well as eating regularly and within a 12 hour window most days. The need to choose "clean , green" food 50 to 75% of the time is discussed, as well as to make water the primary drink and set a goal of 64 ounces water daily.    Weight /BMI 07/18/2018 05/31/2018 03/08/2018  WEIGHT 226 lb 226 lb 226 lb  HEIGHT 5\' 3"  5\' 3"  5\' 3"   BMI 40.03 kg/m2 40.03 kg/m2 40.03 kg/m2  Some encounter information is confidential and restricted. Go to Review Flowsheets activity to see all data.  continue phentermine and add metformin  daily

## 2018-07-23 NOTE — Assessment & Plan Note (Signed)
Patient educated about the importance of limiting  Carbohydrate intake , the need to commit to daily physical activity for a minimum of 30 minutes , and to commit weight loss. The fact that changes in all these areas will reduce or eliminate all together the development of diabetes is stressed.   Diabetic Labs Latest Ref Rng & Units 03/08/2018 10/18/2017 03/07/2017 01/06/2017 05/10/2016  HbA1c <5.7 % of total Hgb 6.0(H) 5.9(H) - 5.8(H) 5.6  Microalbumin Not estab mg/dL - - - - -  Micro/Creat Ratio <30 mcg/mg creat - - - - -  Chol <200 mg/dL - 196 - 174 146  HDL >50 mg/dL - 60 - 56 47(L)  Calc LDL mg/dL (calc) - 117(H) - 100(H) 87  Triglycerides <150 mg/dL - 87 - 86 59  Creatinine 0.50 - 1.05 mg/dL 1.02 0.94 1.07(H) 1.12(H) 0.96   BP/Weight 07/18/2018 05/31/2018 03/08/2018 11/21/2017 11/03/2017 10/18/2017 8/46/6599  Systolic BP 357 017 793 903 009 233 007  Diastolic BP 88 88 88 78 88 88 84  Wt. (Lbs) 226 226 226 217 210.12 212 218  BMI 40.03 40.03 40.03 37.84 36.64 37.55 38.62  Some encounter information is confidential and restricted. Go to Review Flowsheets activity to see all data.   Foot/eye exam completion dates 06/03/2015 04/16/2015  Foot Form Completion Done Done  DStart daily metformin

## 2018-07-23 NOTE — Assessment & Plan Note (Signed)
Hyperlipidemia:Low fat diet discussed and encouraged.   Lipid Panel  Lab Results  Component Value Date   CHOL 196 10/18/2017   HDL 60 10/18/2017   LDLCALC 117 (H) 10/18/2017   TRIG 87 10/18/2017   CHOLHDL 3.3 10/18/2017   Needs to reduce fatty foods

## 2018-07-23 NOTE — Assessment & Plan Note (Signed)
Controlled, no change in medication DASH diet and commitment to daily physical activity for a minimum of 30 minutes discussed and encouraged, as a part of hypertension management. The importance of attaining a healthy weight is also discussed.  BP/Weight 07/18/2018 05/31/2018 03/08/2018 11/21/2017 11/03/2017 10/18/2017 03/11/221  Systolic BP 361 224 497 530 051 102 111  Diastolic BP 88 88 88 78 88 88 84  Wt. (Lbs) 226 226 226 217 210.12 212 218  BMI 40.03 40.03 40.03 37.84 36.64 37.55 38.62  Some encounter information is confidential and restricted. Go to Review Flowsheets activity to see all data.

## 2018-07-24 ENCOUNTER — Ambulatory Visit: Payer: Medicare Other | Admitting: Orthopedic Surgery

## 2018-08-02 ENCOUNTER — Ambulatory Visit: Payer: Medicare Other | Admitting: Family Medicine

## 2018-08-03 ENCOUNTER — Ambulatory Visit (INDEPENDENT_AMBULATORY_CARE_PROVIDER_SITE_OTHER): Payer: Medicare Other | Admitting: Psychiatry

## 2018-08-03 ENCOUNTER — Encounter (HOSPITAL_COMMUNITY): Payer: Self-pay | Admitting: Psychiatry

## 2018-08-03 ENCOUNTER — Other Ambulatory Visit: Payer: Self-pay

## 2018-08-03 DIAGNOSIS — F331 Major depressive disorder, recurrent, moderate: Secondary | ICD-10-CM | POA: Diagnosis not present

## 2018-08-03 MED ORDER — TRAZODONE HCL 50 MG PO TABS: 50.0000 mg | ORAL_TABLET | Freq: Every day | ORAL | 2 refills | Status: DC

## 2018-08-03 MED ORDER — BUPROPION HCL ER (XL) 150 MG PO TB24: 150.0000 mg | ORAL_TABLET | ORAL | 2 refills | Status: DC

## 2018-08-03 MED ORDER — DULOXETINE HCL 60 MG PO CPEP: 60.0000 mg | ORAL_CAPSULE | Freq: Two times a day (BID) | ORAL | 5 refills | Status: DC
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
## 2018-08-15 ENCOUNTER — Other Ambulatory Visit: Payer: Self-pay | Admitting: Family Medicine

## 2018-08-22 ENCOUNTER — Encounter (HOSPITAL_COMMUNITY): Payer: Self-pay | Admitting: Licensed Clinical Social Worker

## 2018-08-22 ENCOUNTER — Ambulatory Visit (HOSPITAL_COMMUNITY): Payer: Medicare Other | Admitting: Licensed Clinical Social Worker

## 2018-08-22 ENCOUNTER — Telehealth (HOSPITAL_COMMUNITY): Payer: Self-pay | Admitting: Licensed Clinical Social Worker

## 2018-08-22 NOTE — Telephone Encounter (Signed)
08/22/18 2:40pm discharged letter was sent to patient due to  - Too Many No-Shows Letter sent certified mail #[phone].Marland KitchenMariana Kaufman

## 2018-08-27 DIAGNOSIS — R05 Cough: Secondary | ICD-10-CM | POA: Diagnosis not present

## 2018-08-27 DIAGNOSIS — Z7984 Long term (current) use of oral hypoglycemic drugs: Secondary | ICD-10-CM | POA: Diagnosis not present

## 2018-08-27 DIAGNOSIS — Z8249 Family history of ischemic heart disease and other diseases of the circulatory system: Secondary | ICD-10-CM | POA: Diagnosis not present

## 2018-08-27 DIAGNOSIS — U071 COVID-19: Secondary | ICD-10-CM | POA: Diagnosis not present

## 2018-08-27 DIAGNOSIS — Z6841 Body Mass Index (BMI) 40.0 and over, adult: Secondary | ICD-10-CM | POA: Diagnosis not present

## 2018-08-27 DIAGNOSIS — R509 Fever, unspecified: Secondary | ICD-10-CM | POA: Diagnosis not present

## 2018-08-27 DIAGNOSIS — I10 Essential (primary) hypertension: Secondary | ICD-10-CM | POA: Diagnosis not present

## 2018-08-27 DIAGNOSIS — J9801 Acute bronchospasm: Secondary | ICD-10-CM | POA: Diagnosis present

## 2018-08-27 DIAGNOSIS — E119 Type 2 diabetes mellitus without complications: Secondary | ICD-10-CM | POA: Diagnosis not present

## 2018-08-27 DIAGNOSIS — F329 Major depressive disorder, single episode, unspecified: Secondary | ICD-10-CM | POA: Diagnosis present

## 2018-08-27 DIAGNOSIS — J1289 Other viral pneumonia: Secondary | ICD-10-CM | POA: Diagnosis not present

## 2018-08-27 DIAGNOSIS — R0602 Shortness of breath: Secondary | ICD-10-CM | POA: Diagnosis not present

## 2018-08-27 DIAGNOSIS — F331 Major depressive disorder, recurrent, moderate: Secondary | ICD-10-CM | POA: Diagnosis not present

## 2018-08-28 ENCOUNTER — Telehealth: Payer: Self-pay | Admitting: Family Medicine

## 2018-08-28 NOTE — Telephone Encounter (Signed)
Pt is calling to advise that she is currently admitted into [facility], Positive Covid testing

## 2018-08-28 NOTE — Telephone Encounter (Signed)
noted 

## 2018-08-28 NOTE — Telephone Encounter (Signed)
Will need f/u video visit , ask that she notify when discharged and let her know we wish her a full and speedy recovery

## 2018-08-29 NOTE — Telephone Encounter (Signed)
Called pt to wish her well, and let her know that we are sending lots of love and prayers. Pt states she is hurting a lil and tired. I told her to hang in there and get rest and to please call when she was released so we could do a video visit

## 2018-09-04 ENCOUNTER — Encounter (HOSPITAL_COMMUNITY): Payer: Self-pay | Admitting: Psychiatry

## 2018-09-04 ENCOUNTER — Ambulatory Visit (INDEPENDENT_AMBULATORY_CARE_PROVIDER_SITE_OTHER): Payer: Medicare Other | Admitting: Psychiatry

## 2018-09-04 ENCOUNTER — Other Ambulatory Visit: Payer: Self-pay

## 2018-09-04 DIAGNOSIS — F331 Major depressive disorder, recurrent, moderate: Secondary | ICD-10-CM

## 2018-09-04 MED ORDER — BUPROPION HCL ER (XL) 150 MG PO TB24: 150.0000 mg | ORAL_TABLET | ORAL | 2 refills | Status: DC

## 2018-09-04 MED ORDER — TRAZODONE HCL 50 MG PO TABS: 50.0000 mg | ORAL_TABLET | Freq: Every day | ORAL | 2 refills | Status: DC

## 2018-09-04 MED ORDER — DULOXETINE HCL 60 MG PO CPEP: 60.0000 mg | ORAL_CAPSULE | Freq: Two times a day (BID) | ORAL | 5 refills | Status: DC
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
## 2018-09-08 ENCOUNTER — Telehealth: Payer: Self-pay

## 2018-09-08 NOTE — Telephone Encounter (Signed)
[id number]  Attempted to contact patient to complete [facility] telephone call. No answer. Left message requesting call back. Will try again later. 1st attempt

## 2018-09-11 NOTE — Telephone Encounter (Signed)
[id number]  Attempted to contact patient to complete TOC telephone call. No answer. Left message requesting call back. Will try again later. 2nd attempt

## 2018-09-11 NOTE — Telephone Encounter (Signed)
[id number]  Attempted to contact patient to complete TOC telephone call. No answer. Left message requesting call back. . 3rd attempt

## 2018-09-12 ENCOUNTER — Other Ambulatory Visit: Payer: Self-pay

## 2018-09-12 ENCOUNTER — Ambulatory Visit (INDEPENDENT_AMBULATORY_CARE_PROVIDER_SITE_OTHER): Payer: Medicare Other | Admitting: Family Medicine

## 2018-09-12 ENCOUNTER — Encounter: Payer: Self-pay | Admitting: Family Medicine

## 2018-09-12 DIAGNOSIS — Z9981 Dependence on supplemental oxygen: Secondary | ICD-10-CM

## 2018-09-12 DIAGNOSIS — IMO0001 Reserved for inherently not codable concepts without codable children: Secondary | ICD-10-CM

## 2018-09-12 DIAGNOSIS — F3341 Major depressive disorder, recurrent, in partial remission: Secondary | ICD-10-CM | POA: Diagnosis not present

## 2018-09-12 DIAGNOSIS — Z9189 Other specified personal risk factors, not elsewhere classified: Secondary | ICD-10-CM | POA: Diagnosis not present
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
## 2018-09-12 NOTE — Patient Instructions (Signed)
    Thank you for coming into the office today. I appreciate the opportunity to provide you with the care for your health and wellness. Today we discussed: TOC  Follow Up: 1 month with Dr Moshe Cipro to assess O2 needs   No labs today.  Continue current medications.  Stay hydrated. Rest. Walk a few steps every hour. Try and eat as you can.  Please continue to practice social distancing to keep you, your family, and our community safe.  If you must go out, please wear a Mask and practice good handwashing.  [place]into YOUR HANDS WELL AND FREQUENTLY. AVOID TOUCHING YOUR FACE, UNLESS YOUR HANDS ARE FRESHLY WASHED.  GET FRESH AIR DAILY. STAY HYDRATED WITH WATER.   It was a pleasure to see you and I look forward to continuing to work together on your health and well-being. Please do not hesitate to call the office if you need care or have questions about your care.  Have a wonderful day and week.  With Gratitude,  Cherly Beach, DNP, AGNP-BC

## 2018-09-20 NOTE — Addendum Note (Signed)
Addended by: Perlie Mayo on: 09/20/2018 05:12 PM   Modules accepted: Level of Service

## 2018-10-19 ENCOUNTER — Ambulatory Visit: Payer: Medicare Other | Admitting: Family Medicine

## 2018-10-25 ENCOUNTER — Ambulatory Visit (INDEPENDENT_AMBULATORY_CARE_PROVIDER_SITE_OTHER): Payer: Medicare Other | Admitting: Family Medicine

## 2018-10-25 ENCOUNTER — Other Ambulatory Visit: Payer: Self-pay

## 2018-10-25 ENCOUNTER — Encounter (INDEPENDENT_AMBULATORY_CARE_PROVIDER_SITE_OTHER): Payer: Self-pay

## 2018-10-25 ENCOUNTER — Encounter: Payer: Self-pay | Admitting: Family Medicine

## 2018-10-25 VITALS — BP 124/80 | HR 71 | Temp 97.4°F | Resp 15 | Ht 63.0 in | Wt 241.0 lb

## 2018-10-25 DIAGNOSIS — F3341 Major depressive disorder, recurrent, in partial remission: Secondary | ICD-10-CM

## 2018-10-25 DIAGNOSIS — Z23 Encounter for immunization: Secondary | ICD-10-CM

## 2018-10-25 DIAGNOSIS — R1030 Lower abdominal pain, unspecified: Secondary | ICD-10-CM

## 2018-10-25 DIAGNOSIS — Z8616 Personal history of COVID-19: Secondary | ICD-10-CM

## 2018-10-25 DIAGNOSIS — I10 Essential (primary) hypertension: Secondary | ICD-10-CM

## 2018-10-25 DIAGNOSIS — M170 Bilateral primary osteoarthritis of knee: Secondary | ICD-10-CM | POA: Diagnosis not present

## 2018-10-25 DIAGNOSIS — Z8619 Personal history of other infectious and parasitic diseases: Secondary | ICD-10-CM | POA: Diagnosis not present

## 2018-10-25 HISTORY — DX: Lower abdominal pain, unspecified: R10.30

## 2018-10-25 MED ORDER — KETOROLAC TROMETHAMINE 60 MG/2ML IM SOLN
60.0000 mg | Freq: Once | INTRAMUSCULAR | Status: AC
  Administered 2018-10-25: 60 mg via INTRAMUSCULAR

## 2018-10-25 NOTE — Patient Instructions (Signed)
F/U telephone visit end January, call if you need  Me sooner  Toradol 60 mg iM in office for back and groin pain  Flu vaccine today  STOP metformin and phentermine  Discharge summary from [facility] will be obtained today for review  Please schedule and get your mammogram once your repeat test in October is negative  Continue to get sufficient rest and a nutrition to help in your recovery process.

## 2018-10-25 NOTE — Progress Notes (Signed)
Tami Pearson     MRN: [id number]      DOB: 02/15/1965   HPI Ms. Budde is here for follow up and re-evaluation of chronic medical conditions, medication management and review of any available recent lab and radiology data.  Preventive health is updated, specifically  Cancer screening and Immunization.   Pt was hospitalized from July 26 to August 3 at [facility] with Covid, did not ned intubation, was discharged home on oxygen States still having episodes of shortness of breath with activity and needing to use oxygen, feel weak and light headed still and reports generalized body aches, also new is bad headaches, appetite goes and comes, also some days are better than others.   ROS Denies recent fever or chills. Denies sinus pressure, nasal congestion, ear pain or sore throat. Denies chest congestion, productive cough or wheezing. Denies chest pains, palpitations and leg swelling Denies abdominal pain, nausea, vomiting,diarrhea or constipation.   Denies dysuria, frequency, hesitancy or incontinence. C/o  depression, anxiety or insomnia. Denies skin break down or rash.   PE  BP 124/80   Pulse 71   Temp (!) 97.4 F (36.3 C) (Temporal)   Resp 15   Ht 5\' 3"  (1.6 m)   Wt 241 lb (109.3 kg)   LMP 03/18/2013   SpO2 98%   BMI 42.69 kg/m   Patient alert and oriented and in no cardiopulmonary distress.appears uncomfortable, in pain and ill appearing HEENT: No facial asymmetry, EOMI,   .  Neck supple no JVD, no mass.  Chest: Clear to auscultation bilaterally.  CVS: S1, S2 no murmurs, no S3.Regular rate.  ABD: Soft non tender.   Ext: No edema  GY:[id number]  ROM spine, shoulders, hips and knees.  Skin: Intact, no ulcerations or rash noted.  Psych: Good eye contact, flat affect. Memory intact anxious oand depressed appearing.  CNS: CN 2-12 intact, power,  normal throughout.no focal deficits noted.   Assessment & Plan  Groin pain Over 1 month, radiating to buttock,  likely from arthritis in spine, toradol 60 mg iM  Essential hypertension Controlled, no change in medication DASH diet and commitment to daily physical activity for a minimum of 30 minutes discussed and encouraged, as a part of hypertension management. The importance of attaining a healthy weight is also discussed.  BP/Weight 10/25/2018 07/18/2018 05/31/2018 03/08/2018 11/21/2017 11/03/2017 0/26/3785  Systolic BP 885 027 741 287 867 672 094  Diastolic BP 80 88 88 88 78 88 88  Wt. (Lbs) 241 226 226 226 217 210.12 212  BMI 42.69 40.03 40.03 40.03 37.84 36.64 37.55  Some encounter information is confidential and restricted. Go to Review Flowsheets activity to see all data.       Morbid obesity  Patient re-educated about  the importance of commitment to a  minimum of 150 minutes of exercise per week as able.  The importance of healthy food choices with portion control discussed, as well as eating regularly and within a 12 hour window most days. The need to choose "clean , green" food 50 to 75% of the time is discussed, as well as to make water the primary drink and set a goal of 64 ounces water daily.    Weight /BMI 10/25/2018 07/18/2018 05/31/2018  WEIGHT 241 lb 226 lb 226 lb  HEIGHT 5\' 3"  5\' 3"  5\' 3"   BMI 42.69 kg/m2 40.03 kg/m2 40.03 kg/m2  Some encounter information is confidential and restricted. Go to Review Flowsheets activity to see all data.  Major depression in partial remission (HCC) Not suicidal or homicidal, treated by Psychiatry. Continue Medication prescribed  History of 2019 novel coronavirus disease (COVID-19) Hospitalized in August, still reporting myalgia, fatigue and headache, advised to continue rest and supportive care, and gradually increase activity as tolerated, repots needing supplemental oxygen with activity on n intermittent basis

## 2018-10-25 NOTE — Assessment & Plan Note (Signed)
Over 1 month, radiating to buttock, likely from arthritis in spine, toradol 60 mg iM

## 2018-10-29 ENCOUNTER — Encounter: Payer: Self-pay | Admitting: Family Medicine

## 2018-10-29 DIAGNOSIS — Z8616 Personal history of COVID-19: Secondary | ICD-10-CM | POA: Insufficient documentation

## 2018-10-29 DIAGNOSIS — Z8619 Personal history of other infectious and parasitic diseases: Secondary | ICD-10-CM | POA: Insufficient documentation

## 2018-10-29 HISTORY — DX: Personal history of COVID-19: Z86.16

## 2018-10-29 NOTE — Assessment & Plan Note (Signed)
Controlled, no change in medication DASH diet and commitment to daily physical activity for a minimum of 30 minutes discussed and encouraged, as a part of hypertension management. The importance of attaining a healthy weight is also discussed.  BP/Weight 10/25/2018 07/18/2018 05/31/2018 03/08/2018 11/21/2017 11/03/2017 5/78/9784  Systolic BP 784 128 208 138 871 959 747  Diastolic BP 80 88 88 88 78 88 88  Wt. (Lbs) 241 226 226 226 217 210.12 212  BMI 42.69 40.03 40.03 40.03 37.84 36.64 37.55  Some encounter information is confidential and restricted. Go to Review Flowsheets activity to see all data.

## 2018-10-29 NOTE — Assessment & Plan Note (Addendum)
Hospitalized in August, still reporting myalgia, fatigue and headache, advised to continue rest and supportive care, and gradually increase activity as tolerated, repots needing supplemental oxygen with activity on n intermittent basis

## 2018-10-29 NOTE — Assessment & Plan Note (Addendum)
Not suicidal or homicidal, treated by Psychiatry. Continue Medication prescribed

## 2018-10-29 NOTE — Assessment & Plan Note (Signed)
  Patient re-educated about  the importance of commitment to a  minimum of 150 minutes of exercise per week as able.  The importance of healthy food choices with portion control discussed, as well as eating regularly and within a 12 hour window most days. The need to choose "clean , green" food 50 to 75% of the time is discussed, as well as to make water the primary drink and set a goal of 64 ounces water daily.    Weight /BMI 10/25/2018 07/18/2018 05/31/2018  WEIGHT 241 lb 226 lb 226 lb  HEIGHT 5\' 3"  5\' 3"  5\' 3"   BMI 42.69 kg/m2 40.03 kg/m2 40.03 kg/m2  Some encounter information is confidential and restricted. Go to Review Flowsheets activity to see all data.

## 2018-10-30 ENCOUNTER — Encounter (HOSPITAL_COMMUNITY): Payer: Self-pay | Admitting: Psychiatry

## 2018-10-30 ENCOUNTER — Ambulatory Visit (INDEPENDENT_AMBULATORY_CARE_PROVIDER_SITE_OTHER): Payer: Medicare Other | Admitting: Psychiatry

## 2018-10-30 ENCOUNTER — Other Ambulatory Visit: Payer: Self-pay

## 2018-10-30 DIAGNOSIS — F331 Major depressive disorder, recurrent, moderate: Secondary | ICD-10-CM

## 2018-10-30 MED ORDER — BUPROPION HCL ER (XL) 150 MG PO TB24: 150.0000 mg | ORAL_TABLET | ORAL | 2 refills | Status: DC

## 2018-10-30 MED ORDER — TRAZODONE HCL 50 MG PO TABS: 50.0000 mg | ORAL_TABLET | Freq: Every day | ORAL | 2 refills | Status: DC

## 2018-10-30 MED ORDER — DULOXETINE HCL 60 MG PO CPEP: 60.0000 mg | ORAL_CAPSULE | Freq: Two times a day (BID) | ORAL | 5 refills | Status: DC
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
## 2018-11-23 ENCOUNTER — Ambulatory Visit: Payer: Self-pay

## 2018-11-23 ENCOUNTER — Ambulatory Visit (INDEPENDENT_AMBULATORY_CARE_PROVIDER_SITE_OTHER): Payer: Medicare Other | Admitting: Family Medicine

## 2018-11-23 ENCOUNTER — Encounter: Payer: Self-pay | Admitting: Family Medicine

## 2018-11-23 ENCOUNTER — Other Ambulatory Visit: Payer: Self-pay

## 2018-11-23 VITALS — BP 124/80 | HR 71 | Resp 15 | Ht 63.0 in | Wt 241.0 lb

## 2018-11-23 DIAGNOSIS — Z0000 Encounter for general adult medical examination without abnormal findings: Secondary | ICD-10-CM
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
## 2018-11-23 NOTE — Patient Instructions (Addendum)
Tami Pearson , Thank you for taking time to come for your Medicare Wellness Visit. I appreciate your ongoing commitment to your health goals. Please review the following plan we discussed and let me know if I can assist you in the future.   Please continue to practice social distancing to keep you, your family, and our community safe.  If you must go out, please wear a Mask and practice good handwashing.  Screening recommendations/referrals: Colonoscopy: please call us when you are ready for referral Mammogram: up to date-but you need this rechecked this year Bone Density: Will check at 53 years of age Recommended yearly ophthalmology/optometry visit for glaucoma screening and checkup Recommended yearly dental visit for hygiene and checkup  Vaccinations: Influenza vaccine: Completed Pneumococcal vaccine: Completed Tdap vaccine: Due 2024 Shingles vaccine: Check coverage  Advanced directives:  Please let us know if you would like to complete this paperwork  Conditions/risks identified: Falls   Next appointment: 02/27/2019  Preventive Care 40-64 Years, Female Preventive care refers to lifestyle choices and visits with your health care provider that can promote health and wellness. What does preventive care include?  A yearly physical exam. This is also called an annual well check.  Dental exams once or twice a year.  Routine eye exams. Ask your health care provider how often you should have your eyes checked.  Personal lifestyle choices, including:  Daily care of your teeth and gums.  Regular physical activity.  Eating a healthy diet.  Avoiding tobacco and drug use.  Limiting alcohol use.  Practicing safe sex.  Taking low-dose aspirin daily starting at age 54.  Taking vitamin and mineral supplements as recommended by your health care provider. What happens during an annual well check? The services and screenings done by your health care provider during your annual well  check will depend on your age, overall health, lifestyle risk factors, and family history of disease. Counseling  Your health care provider may ask you questions about your:  Alcohol use.  Tobacco use.  Drug use.  Emotional well-being.  Home and relationship well-being.  Sexual activity.  Eating habits.  Work and work Statistician.  Method of birth control.  Menstrual cycle.  Pregnancy history. Screening  You may have the following tests or measurements:  Height, weight, and BMI.  Blood pressure.  Lipid and cholesterol levels. These may be checked every 5 years, or more frequently if you are over 83 years old.  Skin check.  Lung cancer screening. You may have this screening every year starting at age 54 if you have a 30-pack-year history of smoking and currently smoke or have quit within the past 15 years.  Fecal occult blood test (FOBT) of the stool. You may have this test every year starting at age 54.  Flexible sigmoidoscopy or colonoscopy. You may have a sigmoidoscopy every 5 years or a colonoscopy every 10 years starting at age 54.  Hepatitis C blood test.  Hepatitis B blood test.  Sexually transmitted disease (STD) testing.  Diabetes screening. This is done by checking your blood sugar (glucose) after you have not eaten for a while (fasting). You may have this done every 1-3 years.  Mammogram. This may be done every 1-2 years. Talk to your health care provider about when you should start having regular mammograms. This may depend on whether you have a family history of breast cancer.  BRCA-related cancer screening. This may be done if you have a family history of breast, ovarian, tubal, or peritoneal  cancers.  Pelvic exam and Pap test. This may be done every 3 years starting at age 54. Starting at age 54, this may be done every 5 years if you have a Pap test in combination with an HPV test.  Bone density scan. This is done to screen for osteoporosis. You  may have this scan if you are at high risk for osteoporosis. Discuss your test results, treatment options, and if necessary, the need for more tests with your health care provider. Vaccines  Your health care provider may recommend certain vaccines, such as:  Influenza vaccine. This is recommended every year.  Tetanus, diphtheria, and acellular pertussis (Tdap, Td) vaccine. You may need a Td booster every 10 years.  Zoster vaccine. You may need this after age 54.  Pneumococcal 13-valent conjugate (PCV13) vaccine. You may need this if you have certain conditions and were not previously vaccinated.  Pneumococcal polysaccharide (PPSV23) vaccine. You may need one or two doses if you smoke cigarettes or if you have certain conditions. Talk to your health care provider about which screenings and vaccines you need and how often you need them. This information is not intended to replace advice given to you by your health care provider. Make sure you discuss any questions you have with your health care provider. Document Released: 02/14/2015 Document Revised: 10/08/2015 Document Reviewed: 11/19/2014 Elsevier Interactive Patient Education  2017 [employer] Prevention in the Home Falls can cause injuries. They can happen to people of all ages. There are many things you can do to make your home safe and to help prevent falls. What can I do on the outside of my home?  Regularly fix the edges of walkways and driveways and fix any cracks.  Remove anything that might make you trip as you walk through a door, such as a raised step or threshold.  Trim any bushes or trees on the path to your home.  Use bright outdoor lighting.  Clear any walking paths of anything that might make someone trip, such as rocks or tools.  Regularly check to see if handrails are loose or broken. Make sure that both sides of any steps have handrails.  Any raised decks and porches should have guardrails on the  edges.  Have any leaves, snow, or ice cleared regularly.  Use sand or salt on walking paths during winter.  Clean up any spills in your garage right away. This includes oil or grease spills. What can I do in the bathroom?  Use night lights.  Install grab bars by the toilet and in the tub and shower. Do not use towel bars as grab bars.  Use non-skid mats or decals in the tub or shower.  If you need to sit down in the shower, use a plastic, non-slip stool.  Keep the floor dry. Clean up any water that spills on the floor as soon as it happens.  Remove soap buildup in the tub or shower regularly.  Attach bath mats securely with double-sided non-slip rug tape.  Do not have throw rugs and other things on the floor that can make you trip. What can I do in the bedroom?  Use night lights.  Make sure that you have a light by your bed that is easy to reach.  Do not use any sheets or blankets that are too big for your bed. They should not hang down onto the floor.  Have a firm chair that has side arms. You can use  this for support while you get dressed.  Do not have throw rugs and other things on the floor that can make you trip. What can I do in the kitchen?  Clean up any spills right away.  Avoid walking on wet floors.  Keep items that you use a lot in easy-to-reach places.  If you need to reach something above you, use a strong step stool that has a grab bar.  Keep electrical cords out of the way.  Do not use floor polish or wax that makes floors slippery. If you must use wax, use non-skid floor wax.  Do not have throw rugs and other things on the floor that can make you trip. What can I do with my stairs?  Do not leave any items on the stairs.  Make sure that there are handrails on both sides of the stairs and use them. Fix handrails that are broken or loose. Make sure that handrails are as long as the stairways.  Check any carpeting to make sure that it is firmly  attached to the stairs. Fix any carpet that is loose or worn.  Avoid having throw rugs at the top or bottom of the stairs. If you do have throw rugs, attach them to the floor with carpet tape.  Make sure that you have a light switch at the top of the stairs and the bottom of the stairs. If you do not have them, ask someone to add them for you. What else can I do to help prevent falls?  Wear shoes that:  Do not have high heels.  Have rubber bottoms.  Are comfortable and fit you well.  Are closed at the toe. Do not wear sandals.  If you use a stepladder:  Make sure that it is fully opened. Do not climb a closed stepladder.  Make sure that both sides of the stepladder are locked into place.  Ask someone to hold it for you, if possible.  Clearly mark and make sure that you can see:  Any grab bars or handrails.  First and last steps.  Where the edge of each step is.  Use tools that help you move around (mobility aids) if they are needed. These include:  Canes.  Walkers.  Scooters.  Crutches.  Turn on the lights when you go into a dark area. Replace any light bulbs as soon as they burn out.  Set up your furniture so you have a clear path. Avoid moving your furniture around.  If any of your floors are uneven, fix them.  If there are any pets around you, be aware of where they are.  Review your medicines with your doctor. Some medicines can make you feel dizzy. This can increase your chance of falling. Ask your doctor what other things that you can do to help prevent falls. This information is not intended to replace advice given to you by your health care provider. Make sure you discuss any questions you have with your health care provider. Document Released: 11/14/2008 Document Revised: 06/26/2015 Document Reviewed: 02/22/2014 Elsevier Interactive Patient Education  2017 [employer].

## 2018-11-27 ENCOUNTER — Ambulatory Visit: Payer: Self-pay

## 2019-01-30 ENCOUNTER — Other Ambulatory Visit: Payer: Self-pay

## 2019-01-30 ENCOUNTER — Encounter (HOSPITAL_COMMUNITY): Payer: Self-pay | Admitting: Psychiatry

## 2019-01-30 ENCOUNTER — Ambulatory Visit (INDEPENDENT_AMBULATORY_CARE_PROVIDER_SITE_OTHER): Payer: Medicare Other | Admitting: Psychiatry

## 2019-01-30 DIAGNOSIS — F331 Major depressive disorder, recurrent, moderate: Secondary | ICD-10-CM | POA: Diagnosis not present

## 2019-01-30 MED ORDER — LORAZEPAM 0.5 MG PO TABS: 0.5000 mg | ORAL_TABLET | Freq: Every day | ORAL | 2 refills | Status: DC | PRN

## 2019-01-30 MED ORDER — BUPROPION HCL ER (XL) 150 MG PO TB24: 150.0000 mg | ORAL_TABLET | ORAL | 2 refills | Status: DC

## 2019-01-30 MED ORDER — DULOXETINE HCL 60 MG PO CPEP: 60.0000 mg | ORAL_CAPSULE | Freq: Two times a day (BID) | ORAL | 5 refills | Status: DC

## 2019-01-30 MED ORDER — TRAZODONE HCL 50 MG PO TABS: 50.0000 mg | ORAL_TABLET | Freq: Every day | ORAL | 2 refills | Status: DC

## 2019-01-30 MED ORDER — HYDROXYZINE HCL 50 MG PO TABS: ORAL_TABLET | ORAL | 1 refills | Status: DC
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
## 2019-02-27 ENCOUNTER — Other Ambulatory Visit: Payer: Self-pay

## 2019-02-27 ENCOUNTER — Ambulatory Visit (INDEPENDENT_AMBULATORY_CARE_PROVIDER_SITE_OTHER): Payer: Medicare Other | Admitting: Family Medicine

## 2019-02-27 VITALS — BP 124/80 | Ht 63.0 in | Wt 241.0 lb

## 2019-02-27 DIAGNOSIS — R6883 Chills (without fever): Secondary | ICD-10-CM

## 2019-02-27 DIAGNOSIS — F3341 Major depressive disorder, recurrent, in partial remission: Secondary | ICD-10-CM

## 2019-02-27 DIAGNOSIS — R0602 Shortness of breath: Secondary | ICD-10-CM

## 2019-02-27 DIAGNOSIS — K529 Noninfective gastroenteritis and colitis, unspecified: Secondary | ICD-10-CM

## 2019-02-27 DIAGNOSIS — A084 Viral intestinal infection, unspecified: Secondary | ICD-10-CM | POA: Insufficient documentation

## 2019-02-27 DIAGNOSIS — B349 Viral infection, unspecified: Secondary | ICD-10-CM | POA: Diagnosis not present

## 2019-02-27 DIAGNOSIS — I10 Essential (primary) hypertension: Secondary | ICD-10-CM

## 2019-02-27 NOTE — Progress Notes (Signed)
Virtual Visit via Telephone Note  I connected with Tami Pearson on 02/27/19 at  1:20 PM EST by telephone and verified that I am speaking with the correct person using two identifiers.  Location: Patient: home Provider: office   I discussed the limitations, risks, security and privacy concerns of performing an evaluation and management service by telephone and the availability of in person appointments. I also discussed with the patient that there may be a patient responsible charge related to this service. The patient expressed understanding and agreed to proceed.   History of Present Illness: 3 day h/o generalized aches , chills , dry cough, no sinus pressure, sore throat or ear pain, reduced appetite, 3 day h/o loose stool, no nausea or vomit Has never recovered for covid three months prior, has poor exercise tolerance and chronic fatigue C/o weight gain and wants to resume phentermine, however , based on poor level of function and SOB, this is not an option currently . Denies sinus pressure, nasal congestion, ear pain or sore throat. Denies chest congestion, productive cough or wheezing. Denies chest pains, palpitations and leg swelling    Denies dysuria, frequency, hesitancy or incontinence. C/o chronic  joint pain,  and limitation in mobility. Denies headaches, seizures, numbness, or tingling. Denies uncntrolled  depression, anxiety or insomnia. Denies skin break down or rash.       Observations/Objective: BP 124/80   Ht 5\' 3"  (1.6 m)   Wt 241 lb (109.3 kg)   LMP 03/18/2013   BMI 42.69 kg/m   Good communication with no confusion and intact memory. Alert and oriented x 3 Intermittent cough during speech     Assessment and Plan: Chills New onset chills, h/o covid 3 months ago with persistent fatigue. Tylenol alternating with ibuprofen for symptom control, push fluids and rest, f/u in office in 4 weeks  Gastroenteritis and colitis, viral Symptomatic treatment  for loose stool and nausea, oTC wll be used, no med prescribed, ensure adequate hydration, ED if worsens  Viral illness Fluids , rest , symptom managem,ent, urgent care or ED if worsens  Essential hypertension Controlled, no change in medication   Major depression in partial remission (HCC) Managed by psych, not suicidal or homicidal  Exertional shortness of breath Reports ongoing dyspnea with limited exercise , 3 months post covid, in office eval in 1 month, if persists , refer to pulmonary  Morbid obesity  Patient re-educated about  the importance of commitment to a  minimum of 150 minutes of exercise per week as able.  The importance of healthy food choices with portion control discussed, as well as eating regularly and within a 12 hour window most days. The need to choose "clean , green" food 50 to 75% of the time is discussed, as well as to make water the primary drink and set a goal of 64 ounces water daily.    Weight /BMI 02/27/2019 11/23/2018 10/25/2018  WEIGHT 241 lb 241 lb 241 lb  HEIGHT 5\' 3"  5\' 3"  5\' 3"   BMI 42.69 kg/m2 42.69 kg/m2 42.69 kg/m2  Some encounter information is confidential and restricted. Go to Review Flowsheets activity to see all data.        Follow Up Instructions:    I discussed the assessment and treatment plan with the patient. The patient was provided an opportunity to ask questions and all were answered. The patient agreed with the plan and demonstrated an understanding of the instructions.   The patient was advised to call back or  seek an in-person evaluation if the symptoms worsen or if the condition fails to improve as anticipated.  I provided 15 minutes of non-face-to-face time during this encounter.   Tula Nakayama, MD

## 2019-02-27 NOTE — Patient Instructions (Addendum)
F/U in office visit with MD in 4 weeks, re evaluate post Covid fatigue, call if you need me before   Please use tylenol alternating with ibuprofen for chills and aches. Please ensure adequate liquid and food intake   Please go to the nearest ED if you develop  shortness of breath, fever , or fatigue  You may use supplemental oxygen at 2 L/ min, while walking around in your home if you need it.  I will likely be referring you to a lung Specialist if you continue to need Oxygen  Phentermine cannot be prescribd as you are having difficulty breathing an requiring oxygen   Try to commit to at least 70 % of your food being vegetables or fruits , fresh or frozen  Thanks for choosing [facility], we consider it a privelige to serve you.

## 2019-03-04 ENCOUNTER — Encounter: Payer: Self-pay | Admitting: Family Medicine

## 2019-03-04 DIAGNOSIS — R0602 Shortness of breath: Secondary | ICD-10-CM | POA: Insufficient documentation

## 2019-03-04 NOTE — Assessment & Plan Note (Signed)
Managed by psych, not suicidal or homicidal

## 2019-03-04 NOTE — Assessment & Plan Note (Signed)
Controlled, no change in medication  

## 2019-03-04 NOTE — Assessment & Plan Note (Signed)
New onset chills, h/o covid 3 months ago with persistent fatigue. Tylenol alternating with ibuprofen for symptom control, push fluids and rest, f/u in office in 4 weeks

## 2019-03-04 NOTE — Assessment & Plan Note (Signed)
  Patient re-educated about  the importance of commitment to a  minimum of 150 minutes of exercise per week as able.  The importance of healthy food choices with portion control discussed, as well as eating regularly and within a 12 hour window most days. The need to choose "clean , green" food 50 to 75% of the time is discussed, as well as to make water the primary drink and set a goal of 64 ounces water daily.    Weight /BMI 02/27/2019 11/23/2018 10/25/2018  WEIGHT 241 lb 241 lb 241 lb  HEIGHT 5\' 3"  5\' 3"  5\' 3"   BMI 42.69 kg/m2 42.69 kg/m2 42.69 kg/m2  Some encounter information is confidential and restricted. Go to Review Flowsheets activity to see all data.

## 2019-03-04 NOTE — Assessment & Plan Note (Signed)
Fluids , rest , symptom managem,ent, urgent care or ED if worsens

## 2019-03-04 NOTE — Assessment & Plan Note (Signed)
Symptomatic treatment for loose stool and nausea, oTC wll be used, no med prescribed, ensure adequate hydration, ED if worsens

## 2019-03-04 NOTE — Assessment & Plan Note (Signed)
Reports ongoing dyspnea with limited exercise , 3 months post covid, in office eval in 1 month, if persists , refer to pulmonary

## 2019-03-27 ENCOUNTER — Encounter: Payer: Self-pay | Admitting: Family Medicine

## 2019-03-27 ENCOUNTER — Ambulatory Visit (INDEPENDENT_AMBULATORY_CARE_PROVIDER_SITE_OTHER): Payer: Medicare Other | Admitting: Family Medicine

## 2019-03-27 ENCOUNTER — Other Ambulatory Visit: Payer: Self-pay

## 2019-03-27 VITALS — BP 124/80 | Ht 63.0 in | Wt 241.0 lb

## 2019-03-27 DIAGNOSIS — I10 Essential (primary) hypertension: Secondary | ICD-10-CM

## 2019-03-27 DIAGNOSIS — Z1159 Encounter for screening for other viral diseases: Secondary | ICD-10-CM

## 2019-03-27 DIAGNOSIS — R7301 Impaired fasting glucose: Secondary | ICD-10-CM | POA: Diagnosis not present

## 2019-03-27 DIAGNOSIS — Z1211 Encounter for screening for malignant neoplasm of colon: Secondary | ICD-10-CM

## 2019-03-27 DIAGNOSIS — M549 Dorsalgia, unspecified: Secondary | ICD-10-CM

## 2019-03-27 DIAGNOSIS — Z1231 Encounter for screening mammogram for malignant neoplasm of breast: Secondary | ICD-10-CM | POA: Diagnosis not present

## 2019-03-27 DIAGNOSIS — E785 Hyperlipidemia, unspecified: Secondary | ICD-10-CM

## 2019-03-27 DIAGNOSIS — G44009 Cluster headache syndrome, unspecified, not intractable: Secondary | ICD-10-CM | POA: Insufficient documentation

## 2019-03-27 DIAGNOSIS — E559 Vitamin D deficiency, unspecified: Secondary | ICD-10-CM | POA: Diagnosis not present

## 2019-03-27 DIAGNOSIS — G44029 Chronic cluster headache, not intractable: Secondary | ICD-10-CM | POA: Diagnosis not present

## 2019-03-27 DIAGNOSIS — R7302 Impaired glucose tolerance (oral): Secondary | ICD-10-CM | POA: Diagnosis not present

## 2019-03-27 MED ORDER — PREDNISONE 20 MG PO TABS: ORAL_TABLET | ORAL | 0 refills | Status: DC

## 2019-03-27 MED ORDER — GABAPENTIN 400 MG PO CAPS: ORAL_CAPSULE | ORAL | 1 refills | Status: DC

## 2019-03-27 MED ORDER — BUTALBITAL-APAP-CAFFEINE 50-325-40 MG PO TABS: ORAL_TABLET | ORAL | 0 refills | Status: DC

## 2019-03-27 MED ORDER — SPIRONOLACTONE-HCTZ 25-25 MG PO TABS: 1.0000 | ORAL_TABLET | Freq: Every day | ORAL | 1 refills | Status: DC

## 2019-03-27 NOTE — Patient Instructions (Addendum)
Annual exam with pap in office with MD in 3  Months, call if you need me sooner  Please schedule mammogram in [place]  For pt and provide her with appt info  You are referred to dr fields for colonoscopy, very important you go  Please re order labs from April 2020, mail to pt, she will get them drawn in [place]  Medication is prescribed for cluster migrane headache is prescribed if still having headache call , I will likely refer you to Specialist   Condolence re recent losses  Think about what you will eat, plan ahead. Choose " clean, green, fresh or frozen" over canned, processed or packaged foods which are more sugary, salty and fatty. 70 to 75% of food eaten should be vegetables and fruit. Three meals at set times with snacks allowed between meals, but they must be fruit or vegetables. Aim to eat over a 12 hour period , example 7 am to 7 pm, and STOP after  your last meal of the day. Drink water,generally about 64 ounces per day, no other drink is as healthy. Fruit juice is best enjoyed in a healthy way, by EATING the fruit. It is important that you exercise regularly at least 30 minutes 5 times a week. If you develop chest pain, have severe difficulty breathing, or feel very tired, stop exercising immediately and seek medical attention  Thanks for choosing [facility], we consider it a privelige to serve you.

## 2019-03-27 NOTE — Progress Notes (Signed)
Virtual Visit via Telephone Note  I connected with Tami Pearson on 03/27/19 at  2:40 PM EST by telephone and verified that I am speaking with the correct person using two identifiers.  Location: Patient: home Provider: office   I discussed the limitations, risks, security and privacy concerns of performing an evaluation and management service by telephone and the availability of in person appointments. I also discussed with the patient that there may be a patient responsible charge related to this service. The patient expressed understanding and agreed to proceed.   History of Present Illness: F/U chronic problems, medication review, and refill medication when necessary. Review most recent labs and order labs which are due Review preventive health and update with necessary referrals or immunizations as indicated Throbbing frontal headache x 3 weeks, daily, keeps pt awake and even awakens her , has tried tylenol , no fever, chills, no blurry visiton, or loss of strength or sensation Has been diagnosed with migraines in the past,  No nausea or vomit, light worsens, sleep relieves Denies recent fever or chills. Denies sinus pressure, nasal congestion, ear pain or sore throat. Denies chest congestion, productive cough or wheezing. Denies chest pains, palpitations and leg swelling Denies abdominal pain, nausea, vomiting,diarrhea or constipation.   Denies dysuria, frequency, hesitancy or incontinence. C/o chronic  joint pain, swelling and limitation in mobility.  C/o  depression, anxiety and  insomnia. Denies skin break down or rash.       Observations/Objective: BP 124/80   Ht 5\' 3"  (1.6 m)   Wt 241 lb (109.3 kg)   LMP 03/18/2013   BMI 42.69 kg/m   Good communication with no confusion and intact memory. Alert and oriented x 3 No signs of respiratory distress during speech     Assessment and Plan:  Cluster headache syndrome Recent onset of cluster headache with past  migraine history Short course of prednisone and fioricet are prescribed. If persists, will refer to Neurology   Essential hypertension Controlled, no change in medication DASH diet and commitment to daily physical activity for a minimum of 30 minutes discussed and encouraged, as a part of hypertension management. The importance of attaining a healthy weight is also discussed.  BP/Weight 03/27/2019 02/27/2019 11/23/2018 10/25/2018 07/18/2018 1/61/0960 05/06/4096  Systolic BP 119 147 829 562 130 865 784  Diastolic BP 80 80 80 80 88 88 88  Wt. (Lbs) 241 241 241 241 226 226 226  BMI 42.69 42.69 42.69 42.69 40.03 40.03 40.03  Some encounter information is confidential and restricted. Go to Review Flowsheets activity to see all data.       Morbid obesity  Patient re-educated about  the importance of commitment to a  minimum of 150 minutes of exercise per week as able.  The importance of healthy food choices with portion control discussed, as well as eating regularly and within a 12 hour window most days. The need to choose "clean , green" food 50 to 75% of the time is discussed, as well as to make water the primary drink and set a goal of 64 ounces water daily.    Weight /BMI 03/27/2019 02/27/2019 11/23/2018  WEIGHT 241 lb 241 lb 241 lb  HEIGHT 5\' 3"  5\' 3"  5\' 3"   BMI 42.69 kg/m2 42.69 kg/m2 42.69 kg/m2  Some encounter information is confidential and restricted. Go to Review Flowsheets activity to see all data.      IGT (impaired glucose tolerance) Patient educated about the importance of limiting  Carbohydrate intake ,  the need to commit to daily physical activity for a minimum of 30 minutes , and to commit weight loss. The fact that changes in all these areas will reduce or eliminate all together the development of diabetes is stressed.   Diabetic Labs Latest Ref Rng & Units 03/08/2018 10/18/2017 03/07/2017 01/06/2017 05/10/2016  HbA1c <5.7 % of total Hgb 6.0(H) 5.9(H) - 5.8(H) 5.6   Microalbumin Not estab mg/dL - - - - -  Micro/Creat Ratio <30 mcg/mg creat - - - - -  Chol <200 mg/dL - 469 - 629 528  HDL >41 mg/dL - 60 - 56 32(G)  Calc LDL mg/dL (calc) - 401(U) - 272(Z) 87  Triglycerides <150 mg/dL - 87 - 86 59  Creatinine 0.50 - 1.05 mg/dL 3.66 4.40 3.47(Q) 2.59(D) 0.96   BP/Weight 03/27/2019 02/27/2019 11/23/2018 10/25/2018 07/18/2018 05/31/2018 03/08/2018  Systolic BP 124 124 124 124 132 132 132  Diastolic BP 80 80 80 80 88 88 88  Wt. (Lbs) 241 241 241 241 226 226 226  BMI 42.69 42.69 42.69 42.69 40.03 40.03 40.03  Some encounter information is confidential and restricted. Go to Review Flowsheets activity to see all data.   Foot/eye exam completion dates 06/03/2015 04/16/2015  Foot Form Completion Done Done    Updated lab needed at/ before next visit.    Follow Up Instructions:    I discussed the assessment and treatment plan with the patient. The patient was provided an opportunity to ask questions and all were answered. The patient agreed with the plan and demonstrated an understanding of the instructions.   The patient was advised to call back or seek an in-person evaluation if the symptoms worsen or if the condition fails to improve as anticipated.  I provided 21 minutes of non-face-to-face time during this encounter.   Syliva Overman, MD

## 2019-03-31 ENCOUNTER — Encounter: Payer: Self-pay | Admitting: Family Medicine

## 2019-03-31 NOTE — Assessment & Plan Note (Signed)
Controlled, no change in medication DASH diet and commitment to daily physical activity for a minimum of 30 minutes discussed and encouraged, as a part of hypertension management. The importance of attaining a healthy weight is also discussed.  BP/Weight 03/27/2019 02/27/2019 11/23/2018 10/25/2018 07/18/2018 05/31/2018 03/08/2018  Systolic BP 124 124 124 124 132 132 132  Diastolic BP 80 80 80 80 88 88 88  Wt. (Lbs) 241 241 241 241 226 226 226  BMI 42.69 42.69 42.69 42.69 40.03 40.03 40.03  Some encounter information is confidential and restricted. Go to Review Flowsheets activity to see all data.

## 2019-03-31 NOTE — Assessment & Plan Note (Signed)
Recent onset of cluster headache with past migraine history Short course of prednisone and fioricet are prescribed. If persists, will refer to Neurology

## 2019-03-31 NOTE — Assessment & Plan Note (Signed)
  Patient re-educated about  the importance of commitment to a  minimum of 150 minutes of exercise per week as able.  The importance of healthy food choices with portion control discussed, as well as eating regularly and within a 12 hour window most days. The need to choose "clean , green" food 50 to 75% of the time is discussed, as well as to make water the primary drink and set a goal of 64 ounces water daily.    Weight /BMI 03/27/2019 02/27/2019 11/23/2018  WEIGHT 241 lb 241 lb 241 lb  HEIGHT 5\' 3"  5\' 3"  5\' 3"   BMI 42.69 kg/m2 42.69 kg/m2 42.69 kg/m2  Some encounter information is confidential and restricted. Go to Review Flowsheets activity to see all data.

## 2019-03-31 NOTE — Assessment & Plan Note (Signed)
Patient educated about the importance of limiting  Carbohydrate intake , the need to commit to daily physical activity for a minimum of 30 minutes , and to commit weight loss. The fact that changes in all these areas will reduce or eliminate all together the development of diabetes is stressed.   Diabetic Labs Latest Ref Rng & Units 03/08/2018 10/18/2017 03/07/2017 01/06/2017 05/10/2016  HbA1c <5.7 % of total Hgb 6.0(H) 5.9(H) - 5.8(H) 5.6  Microalbumin Not estab mg/dL - - - - -  Micro/Creat Ratio <30 mcg/mg creat - - - - -  Chol <200 mg/dL - 229 - 798 921  HDL >19 mg/dL - 60 - 56 41(D)  Calc LDL mg/dL (calc) - 408(X) - 448(J) 87  Triglycerides <150 mg/dL - 87 - 86 59  Creatinine 0.50 - 1.05 mg/dL 8.56 3.14 9.70(Y) 6.37(C) 0.96   BP/Weight 03/27/2019 02/27/2019 11/23/2018 10/25/2018 07/18/2018 05/31/2018 03/08/2018  Systolic BP 124 124 124 124 132 132 132  Diastolic BP 80 80 80 80 88 88 88  Wt. (Lbs) 241 241 241 241 226 226 226  BMI 42.69 42.69 42.69 42.69 40.03 40.03 40.03  Some encounter information is confidential and restricted. Go to Review Flowsheets activity to see all data.   Foot/eye exam completion dates 06/03/2015 04/16/2015  Foot Form Completion Done Done    Updated lab needed at/ before next visit.

## 2019-04-03 ENCOUNTER — Telehealth: Payer: Self-pay | Admitting: *Deleted

## 2019-04-03 ENCOUNTER — Encounter: Payer: Self-pay | Admitting: Internal Medicine

## 2019-04-03 NOTE — Telephone Encounter (Signed)
Pt would like to start back on her phentremine she was taken off due to covid however she feels like she needs to start back on this

## 2019-04-03 NOTE — Telephone Encounter (Signed)
BASED ON RECENT VISIT, NOT AT THIS TIME

## 2019-04-04 NOTE — Telephone Encounter (Signed)
Pt notified with verbal understanding  °

## 2019-05-02 ENCOUNTER — Encounter (HOSPITAL_COMMUNITY): Payer: Self-pay | Admitting: Psychiatry

## 2019-05-02 ENCOUNTER — Other Ambulatory Visit: Payer: Self-pay

## 2019-05-02 ENCOUNTER — Ambulatory Visit (INDEPENDENT_AMBULATORY_CARE_PROVIDER_SITE_OTHER): Payer: Medicare Other | Admitting: Psychiatry

## 2019-05-02 DIAGNOSIS — F331 Major depressive disorder, recurrent, moderate: Secondary | ICD-10-CM | POA: Diagnosis not present

## 2019-05-02 MED ORDER — BUPROPION HCL ER (XL) 150 MG PO TB24: 150.0000 mg | ORAL_TABLET | ORAL | 2 refills | Status: DC

## 2019-05-02 MED ORDER — TRAZODONE HCL 50 MG PO TABS: 50.0000 mg | ORAL_TABLET | Freq: Every day | ORAL | 2 refills | Status: DC

## 2019-05-02 MED ORDER — DULOXETINE HCL 60 MG PO CPEP: 60.0000 mg | ORAL_CAPSULE | Freq: Two times a day (BID) | ORAL | 5 refills | Status: DC

## 2019-05-02 MED ORDER — LORAZEPAM 0.5 MG PO TABS: 0.5000 mg | ORAL_TABLET | Freq: Every day | ORAL | 2 refills | Status: DC | PRN

## 2019-05-02 MED ORDER — HYDROXYZINE HCL 50 MG PO TABS: ORAL_TABLET | ORAL | 1 refills | Status: DC
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
## 2019-05-24 ENCOUNTER — Telehealth: Payer: Self-pay | Admitting: Internal Medicine

## 2019-05-24 ENCOUNTER — Ambulatory Visit: Payer: Medicare Other | Admitting: Gastroenterology

## 2019-05-24 ENCOUNTER — Encounter: Payer: Self-pay | Admitting: Internal Medicine

## 2019-05-24 NOTE — Telephone Encounter (Signed)
Patient was a no show and letter sent  °

## 2019-06-27 ENCOUNTER — Ambulatory Visit: Payer: Medicare Other | Admitting: Family Medicine

## 2019-06-27 DIAGNOSIS — R110 Nausea: Secondary | ICD-10-CM | POA: Diagnosis not present

## 2019-06-27 DIAGNOSIS — J069 Acute upper respiratory infection, unspecified: Secondary | ICD-10-CM | POA: Diagnosis not present

## 2019-06-27 DIAGNOSIS — I429 Cardiomyopathy, unspecified: Secondary | ICD-10-CM | POA: Diagnosis not present

## 2019-06-27 DIAGNOSIS — B9789 Other viral agents as the cause of diseases classified elsewhere: Secondary | ICD-10-CM | POA: Diagnosis not present

## 2019-06-27 DIAGNOSIS — R05 Cough: Secondary | ICD-10-CM | POA: Diagnosis not present

## 2019-06-27 DIAGNOSIS — R197 Diarrhea, unspecified: Secondary | ICD-10-CM | POA: Diagnosis not present

## 2019-06-27 DIAGNOSIS — R109 Unspecified abdominal pain: Secondary | ICD-10-CM | POA: Diagnosis not present

## 2019-06-28 ENCOUNTER — Telehealth: Payer: Self-pay

## 2019-06-28 NOTE — Telephone Encounter (Signed)
Pt went to [facility], in which she was advised she has an enlarged heart, and they would send information over to our office

## 2019-07-03 NOTE — Telephone Encounter (Signed)
Pt set up for appt to come in and see dr Lodema Hong 07-10-19 as she still has not felt good

## 2019-07-03 NOTE — Telephone Encounter (Signed)
Reached out to pt to make sure she was doing ok with NA NVM

## 2019-07-10 ENCOUNTER — Ambulatory Visit: Payer: Medicare Other | Admitting: Family Medicine

## 2019-08-01 ENCOUNTER — Other Ambulatory Visit: Payer: Self-pay

## 2019-08-01 ENCOUNTER — Encounter (HOSPITAL_COMMUNITY): Payer: Self-pay | Admitting: Psychiatry

## 2019-08-01 ENCOUNTER — Telehealth (INDEPENDENT_AMBULATORY_CARE_PROVIDER_SITE_OTHER): Payer: Medicare Other | Admitting: Psychiatry

## 2019-08-01 DIAGNOSIS — F331 Major depressive disorder, recurrent, moderate: Secondary | ICD-10-CM

## 2019-08-01 MED ORDER — HYDROXYZINE HCL 50 MG PO TABS: ORAL_TABLET | ORAL | 1 refills | Status: DC

## 2019-08-01 MED ORDER — TRAZODONE HCL 50 MG PO TABS: 50.0000 mg | ORAL_TABLET | Freq: Every day | ORAL | 2 refills | Status: DC

## 2019-08-01 MED ORDER — DULOXETINE HCL 60 MG PO CPEP: 60.0000 mg | ORAL_CAPSULE | Freq: Two times a day (BID) | ORAL | 5 refills | Status: DC

## 2019-08-01 MED ORDER — LORAZEPAM 0.5 MG PO TABS: 0.5000 mg | ORAL_TABLET | Freq: Every day | ORAL | 2 refills | Status: DC | PRN

## 2019-08-01 MED ORDER — BUPROPION HCL ER (XL) 150 MG PO TB24: 150.0000 mg | ORAL_TABLET | ORAL | 2 refills | Status: DC
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
## 2019-09-17 ENCOUNTER — Encounter: Payer: Medicare Other | Admitting: Family Medicine

## 2019-09-17 ENCOUNTER — Other Ambulatory Visit: Payer: Self-pay

## 2019-09-25 ENCOUNTER — Encounter: Payer: Medicare Other | Admitting: Family Medicine

## 2019-10-02 ENCOUNTER — Encounter: Payer: Medicare Other | Admitting: Family Medicine

## 2019-11-01 ENCOUNTER — Encounter (HOSPITAL_COMMUNITY): Payer: Self-pay | Admitting: Psychiatry

## 2019-11-01 ENCOUNTER — Other Ambulatory Visit: Payer: Self-pay

## 2019-11-01 ENCOUNTER — Telehealth (INDEPENDENT_AMBULATORY_CARE_PROVIDER_SITE_OTHER): Payer: Medicare Other | Admitting: Psychiatry

## 2019-11-01 DIAGNOSIS — F331 Major depressive disorder, recurrent, moderate: Secondary | ICD-10-CM | POA: Diagnosis not present

## 2019-11-01 MED ORDER — DULOXETINE HCL 60 MG PO CPEP: 60.0000 mg | ORAL_CAPSULE | Freq: Two times a day (BID) | ORAL | 5 refills | Status: DC

## 2019-11-01 MED ORDER — BUPROPION HCL ER (XL) 150 MG PO TB24: 150.0000 mg | ORAL_TABLET | ORAL | 2 refills | Status: DC

## 2019-11-01 MED ORDER — HYDROXYZINE HCL 50 MG PO TABS
ORAL_TABLET | ORAL | 1 refills | Status: DC
Start: 2019-11-01 — End: 2019-12-25

## 2019-11-01 MED ORDER — LORAZEPAM 0.5 MG PO TABS: 0.5000 mg | ORAL_TABLET | Freq: Every day | ORAL | 2 refills | Status: DC | PRN

## 2019-11-01 MED ORDER — TRAZODONE HCL 50 MG PO TABS
50.0000 mg | ORAL_TABLET | Freq: Every day | ORAL | 2 refills | Status: DC
Start: 2019-11-01 — End: 2019-12-25
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
## 2019-11-13 ENCOUNTER — Other Ambulatory Visit: Payer: Self-pay

## 2019-11-13 ENCOUNTER — Encounter: Payer: Self-pay | Admitting: Family Medicine

## 2019-11-13 ENCOUNTER — Ambulatory Visit (INDEPENDENT_AMBULATORY_CARE_PROVIDER_SITE_OTHER): Payer: Medicare HMO | Admitting: Family Medicine

## 2019-11-13 DIAGNOSIS — Z23 Encounter for immunization: Secondary | ICD-10-CM

## 2019-11-13 DIAGNOSIS — Z1211 Encounter for screening for malignant neoplasm of colon: Secondary | ICD-10-CM | POA: Diagnosis not present

## 2019-11-13 DIAGNOSIS — Z1231 Encounter for screening mammogram for malignant neoplasm of breast: Secondary | ICD-10-CM | POA: Diagnosis not present

## 2019-11-13 DIAGNOSIS — I10 Essential (primary) hypertension: Secondary | ICD-10-CM | POA: Diagnosis not present

## 2019-11-13 DIAGNOSIS — Z6841 Body Mass Index (BMI) 40.0 and over, adult: Secondary | ICD-10-CM | POA: Diagnosis not present

## 2019-11-13 MED ORDER — PHENTERMINE HCL 37.5 MG PO TABS: 37.5000 mg | ORAL_TABLET | Freq: Every day | ORAL | 0 refills | Status: DC

## 2019-11-13 NOTE — Assessment & Plan Note (Signed)

## 2019-11-13 NOTE — Progress Notes (Signed)
Subjective:  Patient ID: Tami Pearson, female    DOB: 02-03-1965  Age: 54 y.o. MRN: [id number]  CC:  Chief Complaint  Patient presents with  . Follow-up    wants med refills, wants to start back on Phentermine      HPI  HPI  Tami Pearson is a 54 year old female patient Dr. Anthony Sar.  Her history is as stated below and includes diabetes, depression, arthritis, obesity, multiple joint pain.  Today he reports for follow-up.  Like to discuss losing weight and going back on phentermine as she would like to have knee surgery.  Needs to follow-up with Dr. Romeo Apple as she might need a right knee replacement.  And she knows that she needs to lose weight in order to be able to do this.    Additionally her health maintenance issues that are outstanding are colonoscopy, mammogram, Pap.  She is willing to have updated mammogram and annual visit ordered for Pap.  Additionally she will do a colonoscopy if she can have a female provider.  She denies having any cough, fever, chills, shortness of breath, headache, chest pain, leg swelling, palpitations, dizziness or vision changes.  She reports that she is sleeping well, has no trouble chewing or swallowing.  Denies having any trouble making water or bowel movements.  No blood in urine or stool.  Today patient denies signs and symptoms of COVID 19 infection including fever, chills, cough, shortness of breath, and headache. Past Medical, Surgical, Social History, Allergies, and Medications have been Reviewed.   Past Medical History:  Diagnosis Date  . Anxiety   . Arthritis 2010 approx   bilateral hip and knee pain  . Bilateral shoulder pain 03/12/2017  . Depression   . Diabetes mellitus without complication (HCC)   . Excessive subcutaneous fat 03/08/2018  . Groin pain 10/25/2018  . Gynecomastia 03/08/2018  . History of 2019 novel coronavirus disease (COVID-19) 10/29/2018   Hospitalized at [facility], no mechanical ventilation  . Hypercholesteremia    . Hypertension   . Metabolic syndrome X 09/10/2012  . Neck pain 03/07/2017    Current Meds  Medication Sig  . buPROPion (WELLBUTRIN XL) 150 MG 24 hr tablet Take 1 tablet (150 mg total) by mouth every morning.  . butalbital-acetaminophen-caffeine (FIORICET) 50-325-40 MG tablet Take one tablet by mouth two times daily, as needed, for heaDACHE  . DULoxetine (CYMBALTA) 60 MG capsule Take 1 capsule (60 mg total) by mouth 2 (two) times daily.  Marland Kitchen gabapentin (NEURONTIN) 400 MG capsule TAKE 1 CAPSULE BY MOUTH IN THE MORNING AND 2 AT BEDTIME  . hydrOXYzine (ATARAX/VISTARIL) 50 MG tablet Take one tablet by mouth at bedtime  . LORazepam (ATIVAN) 0.5 MG tablet Take 1 tablet (0.5 mg total) by mouth daily as needed for anxiety.  Marland Kitchen spironolactone-hydrochlorothiazide (ALDACTAZIDE) 25-25 MG tablet Take 1 tablet by mouth daily.  . traZODone (DESYREL) 50 MG tablet Take 1 tablet (50 mg total) by mouth at bedtime.    ROS:  Review of Systems  Constitutional: Negative.   HENT: Negative.   Eyes: Negative.   Respiratory: Negative.   Cardiovascular: Negative.   Gastrointestinal: Negative.   Genitourinary: Negative.   Musculoskeletal: Positive for joint pain.  Skin: Negative.   Neurological: Negative.   Endo/Heme/Allergies: Negative.   Psychiatric/Behavioral: Negative.      Objective:   Today's Vitals: BP 134/78 (BP Location: Right Wrist, Patient Position: Sitting, Cuff Size: Large)   Pulse 73   Ht 5' 3.5" (1.613 m)  Wt 254 lb (115.2 kg)   LMP 03/18/2013   SpO2 97%   BMI 44.29 kg/m  Vitals with BMI 11/13/2019 03/27/2019 02/27/2019  Height 5' 3.5" 5\' 3"  5\' 3"   Weight 254 lbs 241 lbs 241 lbs  BMI 44.28 42.7 42.7  Systolic 134 124  Diastolic 78 80 80  Pulse 73 - -  Some encounter information is confidential and restricted. Go to Review Flowsheets activity to see all data.     Physical Exam Vitals and nursing note reviewed.  Constitutional:      Appearance: Normal appearance. She is  well-developed and well-groomed. She is obese.  HENT:     Head: Normocephalic and atraumatic.     Right Ear: External ear normal.     Left Ear: External ear normal.     Mouth/Throat:     Comments: Mask in place  Eyes:     General:        Right eye: No discharge.        Left eye: No discharge.     Conjunctiva/sclera: Conjunctivae normal.  Cardiovascular:     Rate and Rhythm: Normal rate and regular rhythm.     Pulses: Normal pulses.     Heart sounds: Normal heart sounds.  Pulmonary:     Effort: Pulmonary effort is normal.     Breath sounds: Normal breath sounds.  Musculoskeletal:        General: Normal range of motion.     Cervical back: Normal range of motion and neck supple.     Comments: Cane   Skin:    General: Skin is warm.  Neurological:     General: No focal deficit present.     Mental Status: She is alert and oriented to person, place, and time.  Psychiatric:        Attention and Perception: Attention normal.        Mood and Affect: Mood normal.        Speech: Speech normal.        Behavior: Behavior normal. Behavior is cooperative.        Thought Content: Thought content normal.        Cognition and Memory: Cognition normal.        Judgment: Judgment normal.     Assessment   1. Morbid obesity with BMI of 40.0-44.9, adult (HCC)   2. Essential hypertension   3. Need for immunization against influenza   4. Screening mammogram, encounter for   5. Special screening for malignant neoplasms, colon     Tests ordered Orders Placed This Encounter  Procedures  . MM Digital Screening  . Flu Vaccine QUAD 36+ mos IM  . Ambulatory referral to Gastroenterology     Plan: Please see assessment and plan per problem list above.   Meds ordered this encounter  Medications  . phentermine (ADIPEX-P) 37.5 MG tablet    Sig: Take 1 tablet (37.5 mg total) by mouth daily before breakfast.    Dispense:  30 tablet    Refill:  0    Order Specific Question:   Supervising  Provider    Answer:   [id number]]    Patient to follow-up in 4 weeks  Note: This dictation was prepared with Dragon dictation along with smaller phrase technology. Similar sounding words can be transcribed inadequately or may not be corrected upon review. Any transcriptional errors that result from this process are unintentional.      11-14-1981, NP

## 2019-11-13 NOTE — Assessment & Plan Note (Signed)
Apprehensive about getting colonoscopy.  Encouraged that if strongly recommended.  Asked that she would like a female provider to help with this.  She reports that that would probably help she is willing to see the gastroenterologist for further discussion.  No signs or symptoms of bowel changes at this time.

## 2019-11-13 NOTE — Assessment & Plan Note (Signed)
Obesity is linked to HTN, HLD, and Depression  Deteriorated.  In addition to wanting to lose weight she needs to lose weight as she wants to have knee surgery.  Would like to try phentermine again.  Blood pressures controlled today.  Willing to come in in 4 weeks for repeat check of weight. Reviewed side effects, risks and benefits of medication.   Patient acknowledged agreement and understanding of the plan.     Wt Readings from Last 3 Encounters:  11/13/19 254 lb (115.2 kg)  03/27/19 241 lb (109.3 kg)  02/27/19 241 lb (109.3 kg)

## 2019-11-13 NOTE — Patient Instructions (Addendum)
  HAPPY FALL!  I appreciate the opportunity to provide you with care for your health and wellness. Today we discussed:   Follow up: 4 weeks for wt check- can be with me;                     also needs appt for CPE with pap with Dr Lodema Hong.  No labs  Referrals today: colonoscopy dr Orders: Mammogram  Please take medication as directed  Please continue to work on eating well.  Please continue to practice social distancing to keep you, your family, and our community safe.  If you must go out, please wear a mask and practice good handwashing.  It was a pleasure to see you and I look forward to continuing to work together on your health and well-being. Please do not hesitate to call the office if you need care or have questions about your care.  Have a wonderful day and week. With Gratitude, Tereasa Coop, DNP, AGNP-BC

## 2019-11-13 NOTE — Assessment & Plan Note (Signed)
Has not had a mammogram since 2018.  Is willing to get updated screening ordered today.  Denies have any lumps bumps or changes.

## 2019-11-13 NOTE — Assessment & Plan Note (Signed)
Controlled, no change in medication.  DASH diet recommended.  Encourage physical activity however she does have a bad knee.  And uses a cane advised that she should do walking as she can safely.

## 2019-11-14 ENCOUNTER — Encounter (INDEPENDENT_AMBULATORY_CARE_PROVIDER_SITE_OTHER): Payer: Self-pay | Admitting: *Deleted

## 2019-11-26 ENCOUNTER — Other Ambulatory Visit: Payer: Self-pay

## 2019-11-26 ENCOUNTER — Ambulatory Visit (INDEPENDENT_AMBULATORY_CARE_PROVIDER_SITE_OTHER): Payer: Medicare HMO

## 2019-11-26 VITALS — BP 134/78 | Ht 63.5 in | Wt 254.0 lb

## 2019-11-26 DIAGNOSIS — Z0000 Encounter for general adult medical examination without abnormal findings: Secondary | ICD-10-CM | POA: Diagnosis not present
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
## 2019-12-06 ENCOUNTER — Encounter: Payer: Medicare HMO | Admitting: Family Medicine

## 2019-12-17 ENCOUNTER — Other Ambulatory Visit: Payer: Self-pay | Admitting: Family Medicine

## 2019-12-17 ENCOUNTER — Telehealth: Payer: Self-pay

## 2019-12-17 MED ORDER — PHENTERMINE HCL 37.5 MG PO TABS: 37.5000 mg | ORAL_TABLET | Freq: Every day | ORAL | 0 refills | Status: DC

## 2019-12-17 NOTE — Telephone Encounter (Signed)
Pt aware.

## 2019-12-17 NOTE — Telephone Encounter (Signed)
Med sent, needs to keep in office Nov appt

## 2019-12-17 NOTE — Telephone Encounter (Signed)
Pt requests refill of phentermine  To [facility] in [place]

## 2019-12-20 ENCOUNTER — Other Ambulatory Visit: Payer: Self-pay

## 2019-12-20 DIAGNOSIS — M549 Dorsalgia, unspecified: Secondary | ICD-10-CM

## 2019-12-20 MED ORDER — GABAPENTIN 400 MG PO CAPS: ORAL_CAPSULE | ORAL | 1 refills | Status: DC

## 2019-12-20 MED ORDER — SPIRONOLACTONE-HCTZ 25-25 MG PO TABS: 1.0000 | ORAL_TABLET | Freq: Every day | ORAL | 1 refills | Status: DC

## 2019-12-24 ENCOUNTER — Telehealth (HOSPITAL_COMMUNITY): Payer: Self-pay | Admitting: *Deleted

## 2019-12-24 ENCOUNTER — Other Ambulatory Visit (HOSPITAL_COMMUNITY): Payer: Self-pay | Admitting: Psychiatry

## 2019-12-24 MED ORDER — DULOXETINE HCL 60 MG PO CPEP: 60.0000 mg | ORAL_CAPSULE | Freq: Two times a day (BID) | ORAL | 2 refills | Status: DC

## 2019-12-24 NOTE — Telephone Encounter (Signed)
sent 

## 2019-12-24 NOTE — Telephone Encounter (Signed)
Patient pharmacy [facility] pharmacy called and would like 90 days script sent to them for patient so they could mail it to them. Per pharmacy patient changed over to them  Per pt she changed to them and she did change to them due to not having a way to keep going to [place] all the time.

## 2019-12-24 NOTE — Telephone Encounter (Signed)
Per previous message, [facility] pharmacy is requesting Cymbalta

## 2019-12-25 ENCOUNTER — Other Ambulatory Visit: Payer: Self-pay

## 2019-12-25 DIAGNOSIS — I10 Essential (primary) hypertension: Secondary | ICD-10-CM

## 2019-12-25 DIAGNOSIS — M170 Bilateral primary osteoarthritis of knee: Secondary | ICD-10-CM

## 2019-12-25 DIAGNOSIS — M549 Dorsalgia, unspecified: Secondary | ICD-10-CM

## 2019-12-25 DIAGNOSIS — E1169 Type 2 diabetes mellitus with other specified complication: Secondary | ICD-10-CM

## 2019-12-25 DIAGNOSIS — G44029 Chronic cluster headache, not intractable: Secondary | ICD-10-CM

## 2019-12-25 DIAGNOSIS — F3341 Major depressive disorder, recurrent, in partial remission: Secondary | ICD-10-CM

## 2019-12-25 DIAGNOSIS — F5102 Adjustment insomnia: Secondary | ICD-10-CM

## 2019-12-25 MED ORDER — LORAZEPAM 0.5 MG PO TABS: 0.5000 mg | ORAL_TABLET | Freq: Every day | ORAL | 2 refills | Status: DC | PRN

## 2019-12-25 MED ORDER — SPIRONOLACTONE-HCTZ 25-25 MG PO TABS: 1.0000 | ORAL_TABLET | Freq: Every day | ORAL | 1 refills | Status: DC

## 2019-12-25 MED ORDER — TRAZODONE HCL 50 MG PO TABS: 50.0000 mg | ORAL_TABLET | Freq: Every day | ORAL | 2 refills | Status: DC

## 2019-12-25 MED ORDER — HYDROXYZINE HCL 50 MG PO TABS: ORAL_TABLET | ORAL | 1 refills | Status: DC

## 2019-12-25 MED ORDER — GABAPENTIN 400 MG PO CAPS: ORAL_CAPSULE | ORAL | 1 refills | Status: DC

## 2019-12-25 MED ORDER — BUPROPION HCL ER (XL) 150 MG PO TB24: 150.0000 mg | ORAL_TABLET | ORAL | 2 refills | Status: DC

## 2019-12-31 ENCOUNTER — Other Ambulatory Visit: Payer: Self-pay | Admitting: Family Medicine

## 2019-12-31 ENCOUNTER — Other Ambulatory Visit: Payer: Self-pay

## 2019-12-31 ENCOUNTER — Telehealth: Payer: Self-pay

## 2019-12-31 DIAGNOSIS — F5102 Adjustment insomnia: Secondary | ICD-10-CM

## 2019-12-31 DIAGNOSIS — F3341 Major depressive disorder, recurrent, in partial remission: Secondary | ICD-10-CM

## 2019-12-31 MED ORDER — BUPROPION HCL ER (XL) 150 MG PO TB24: 150.0000 mg | ORAL_TABLET | ORAL | 1 refills | Status: DC

## 2019-12-31 MED ORDER — HYDROXYZINE HCL 50 MG PO TABS: ORAL_TABLET | ORAL | 1 refills | Status: DC

## 2019-12-31 MED ORDER — TRAZODONE HCL 50 MG PO TABS: 50.0000 mg | ORAL_TABLET | Freq: Every day | ORAL | 1 refills | Status: DC

## 2019-12-31 NOTE — Telephone Encounter (Signed)
Pt requests refill on lorazepam be sent to [facility]

## 2019-12-31 NOTE — Telephone Encounter (Signed)
Patient aware.

## 2019-12-31 NOTE — Telephone Encounter (Signed)
Dr Tenny Craw is the Primary Provider for this medication for her and Dr Allena Katz recently sent a 30 day supply, all requests for this need to go to Dr Tenny Craw. Please explain this to her, thanks

## 2020-01-01 ENCOUNTER — Other Ambulatory Visit (HOSPITAL_COMMUNITY)
Admission: RE | Admit: 2020-01-01 | Discharge: 2020-01-01 | Disposition: A | Discharge status: Home / Self Care | Payer: Medicare HMO | Source: Ambulatory Visit | Attending: Family Medicine | Admitting: Family Medicine

## 2020-01-01 ENCOUNTER — Encounter: Payer: Self-pay | Admitting: Family Medicine

## 2020-01-01 ENCOUNTER — Ambulatory Visit (INDEPENDENT_AMBULATORY_CARE_PROVIDER_SITE_OTHER): Payer: Medicare HMO | Admitting: Family Medicine

## 2020-01-01 ENCOUNTER — Other Ambulatory Visit: Payer: Self-pay

## 2020-01-01 VITALS — BP 120/80 | HR 83 | Temp 98.0°F | Resp 16 | Ht 63.5 in | Wt 247.0 lb

## 2020-01-01 DIAGNOSIS — Z1159 Encounter for screening for other viral diseases: Secondary | ICD-10-CM | POA: Diagnosis not present

## 2020-01-01 DIAGNOSIS — I10 Essential (primary) hypertension: Secondary | ICD-10-CM

## 2020-01-01 DIAGNOSIS — R7302 Impaired glucose tolerance (oral): Secondary | ICD-10-CM | POA: Diagnosis not present

## 2020-01-01 DIAGNOSIS — E785 Hyperlipidemia, unspecified: Secondary | ICD-10-CM | POA: Diagnosis not present

## 2020-01-01 DIAGNOSIS — M170 Bilateral primary osteoarthritis of knee: Secondary | ICD-10-CM

## 2020-01-01 DIAGNOSIS — Z1151 Encounter for screening for human papillomavirus (HPV): Secondary | ICD-10-CM | POA: Diagnosis not present

## 2020-01-01 DIAGNOSIS — Z1211 Encounter for screening for malignant neoplasm of colon: Secondary | ICD-10-CM

## 2020-01-01 DIAGNOSIS — E559 Vitamin D deficiency, unspecified: Secondary | ICD-10-CM

## 2020-01-01 DIAGNOSIS — Z124 Encounter for screening for malignant neoplasm of cervix: Secondary | ICD-10-CM

## 2020-01-01 DIAGNOSIS — Z0001 Encounter for general adult medical examination with abnormal findings: Secondary | ICD-10-CM | POA: Diagnosis not present

## 2020-01-01 DIAGNOSIS — H547 Unspecified visual loss: Secondary | ICD-10-CM

## 2020-01-01 DIAGNOSIS — I889 Nonspecific lymphadenitis, unspecified: Secondary | ICD-10-CM

## 2020-01-01 DIAGNOSIS — M5410 Radiculopathy, site unspecified: Secondary | ICD-10-CM

## 2020-01-01 LAB — POCT GLYCOSYLATED HEMOGLOBIN (HGB A1C): HbA1c, POC (controlled diabetic range): 6.5 % (ref 0.0–7.0)

## 2020-01-01 MED ORDER — METHYLPREDNISOLONE ACETATE 80 MG/ML IJ SUSP
80.0000 mg | Freq: Once | INTRAMUSCULAR | Status: AC
  Administered 2020-01-01: 80 mg via INTRAMUSCULAR

## 2020-01-01 MED ORDER — KETOROLAC TROMETHAMINE 60 MG/2ML IM SOLN
60.0000 mg | Freq: Once | INTRAMUSCULAR | Status: AC
  Administered 2020-01-01: 60 mg via INTRAMUSCULAR

## 2020-01-01 MED ORDER — CEPHALEXIN 500 MG PO CAPS: 500.0000 mg | ORAL_CAPSULE | Freq: Two times a day (BID) | ORAL | 0 refills | Status: DC

## 2020-01-01 MED ORDER — FLUCONAZOLE 150 MG PO TABS: 150.0000 mg | ORAL_TABLET | Freq: Once | ORAL | 0 refills | Status: AC

## 2020-01-01 NOTE — Patient Instructions (Addendum)
F/U in office with MDmid feb, weight loss goal of 10 pounds  Labs today, glyco HB in office  Fasting lipid,cmp and eGFr, TSH , cBC and vit D and hepatitis C screen in lab  Please arrange for fIT test this year, for colon screening  Toradol 60 mg IM an d DepoMedrol 80 mg IM for back and generalzed pain  5 days of keflex is prescribed for tender glands in left armpit, also fluconazole   It is important that you exercise regularly at least 30 minutes 5 times a week. If you develop chest pain, have severe difficulty breathing, or feel very tired, stop exercising immediately and seek medical attention  Think about what you will eat, plan ahead. Choose " clean, green, fresh or frozen" over canned, processed or packaged foods which are more sugary, salty and fatty. 70 to 75% of food eaten should be vegetables and fruit. Three meals at set times with snacks allowed between meals, but they must be fruit or vegetables. Aim to eat over a 12 hour period , example 7 am to 7 pm, and STOP after  your last meal of the day. Drink water,generally about 64 ounces per day, no other drink is as healthy. Fruit juice is best enjoyed in a healthy way, by EATING the fruit. Thanks for choosing [facility], we consider it a privelige to serve you.

## 2020-01-02 LAB — CMP14+EGFR
ALT: 14 IU/L (ref 0–32)
AST: 18 IU/L (ref 0–40)
Albumin/Globulin Ratio: 1 — ABNORMAL LOW (ref 1.2–2.2)
Albumin: 4.1 g/dL (ref 3.8–4.9)
Alkaline Phosphatase: 85 IU/L (ref 44–121)
BUN/Creatinine Ratio: 25 — ABNORMAL HIGH (ref 9–23)
BUN: 23 mg/dL (ref 6–24)
Bilirubin Total: <0.2 mg/dL (ref 0.0–1.2)
CO2: 24 mmol/L (ref 20–29)
Calcium: 9.8 mg/dL (ref 8.7–10.2)
Chloride: 102 mmol/L (ref 96–106)
Creatinine, Ser: 0.93 mg/dL (ref 0.57–1.00)
GFR calc Af Amer: 80 mL/min/{1.73_m2} (ref >59)
GFR calc non Af Amer: 69 mL/min/{1.73_m2} (ref >59)
Globulin, Total: 4.1 g/dL (ref 1.5–4.5)
Glucose: 101 mg/dL — ABNORMAL HIGH (ref 65–99)
Potassium: 4.4 mmol/L (ref 3.5–5.2)
Sodium: 140 mmol/L (ref 134–144)
Total Protein: 8.2 g/dL (ref 6.0–8.5)

## 2020-01-02 LAB — TSH: TSH: 1.56 u[IU]/mL (ref 0.450–4.500)

## 2020-01-02 LAB — LIPID PANEL
Chol/HDL Ratio: 4.4 ratio (ref 0.0–4.4)
Cholesterol, Total: 209 mg/dL — ABNORMAL HIGH (ref 100–199)
HDL: 48 mg/dL (ref >39)
LDL Chol Calc (NIH): 140 mg/dL — ABNORMAL HIGH (ref 0–99)
Triglycerides: 119 mg/dL (ref 0–149)
VLDL Cholesterol Cal: 21 mg/dL (ref 5–40)

## 2020-01-02 LAB — CBC
Hematocrit: 42.7 % (ref 34.0–46.6)
Hemoglobin: 14.1 g/dL (ref 11.1–15.9)
MCH: 28.9 pg (ref 26.6–33.0)
MCHC: 33 g/dL (ref 31.5–35.7)
MCV: 88 fL (ref 79–97)
Platelets: 377 10*3/uL (ref 150–450)
RBC: 4.88 x10E6/uL (ref 3.77–5.28)
RDW: 13.4 % (ref 11.7–15.4)
WBC: 6.8 10*3/uL (ref 3.4–10.8)

## 2020-01-02 LAB — VITAMIN D 25 HYDROXY (VIT D DEFICIENCY, FRACTURES): Vit D, 25-Hydroxy: 22.3 ng/mL — ABNORMAL LOW (ref 30.0–100.0)

## 2020-01-02 LAB — HEPATITIS C ANTIBODY: Hep C Virus Ab: <0.1 s/co ratio (ref 0.0–0.9)

## 2020-01-04 LAB — CYTOLOGY - PAP
Comment: NEGATIVE
Diagnosis: NEGATIVE
High risk HPV: NEGATIVE

## 2020-01-06 ENCOUNTER — Encounter: Payer: Self-pay | Admitting: Family Medicine

## 2020-01-06 DIAGNOSIS — I889 Nonspecific lymphadenitis, unspecified: Secondary | ICD-10-CM | POA: Insufficient documentation

## 2020-01-06 DIAGNOSIS — Z0001 Encounter for general adult medical examination with abnormal findings: Secondary | ICD-10-CM | POA: Insufficient documentation

## 2020-01-06 NOTE — Assessment & Plan Note (Signed)
  Patient re-educated about  the importance of commitment to a  minimum of 150 minutes of exercise per week as able.  The importance of healthy food choices with portion control discussed, as well as eating regularly and within a 12 hour window most days. The need to choose "clean , green" food 50 to 75% of the time is discussed, as well as to make water the primary drink and set a goal of 64 ounces water daily.    Weight /BMI 01/01/2020 11/26/2019 11/13/2019  WEIGHT 247 lb 254 lb 254 lb  HEIGHT 5' 3.5" 5' 3.5" 5' 3.5"  BMI 43.07 kg/m2 44.29 kg/m2 44.29 kg/m2  Some encounter information is confidential and restricted. Go to Review Flowsheets activity to see all data.

## 2020-01-06 NOTE — Assessment & Plan Note (Signed)

## 2020-01-06 NOTE — Assessment & Plan Note (Signed)
Deteriorated, needs to reduce carb intake , portion size and lose weight  Patient educated about the importance of limiting  Carbohydrate intake , the need to commit to daily physical activity for a minimum of 30 minutes , and to commit weight loss. The fact that changes in all these areas will reduce or eliminate all together the development of diabetes is stressed.   Diabetic Labs Latest Ref Rng & Units 01/01/2020 03/08/2018 10/18/2017 03/07/2017 01/06/2017  HbA1c 0.0 - 7.0 % 6.5 6.0(H) 5.9(H) - 5.8(H)  Microalbumin Not estab mg/dL - - - - -  Micro/Creat Ratio <30 mcg/mg creat - - - - -  Chol 100 - 199 mg/dL 967(R) - 916 - 384  HDL >39 mg/dL 48 - 60 - 56  Calc LDL 0 - 99 mg/dL 665(L) - 935(T) - 017(B)  Triglycerides 0 - 149 mg/dL 939 - 87 - 86  Creatinine 0.57 - 1.00 mg/dL 0.30 0.92 3.30 0.76(A) 1.12(H)   BP/Weight 01/01/2020 11/26/2019 11/13/2019 03/27/2019 02/27/2019 11/23/2018 10/25/2018  Systolic BP 120 134 134 124 124 124 124  Diastolic BP 80 78 78 80 80 80 80  Wt. (Lbs) 247 254 254 241 241 241 241  BMI 43.07 44.29 44.29 42.69 42.69 42.69 42.69  Some encounter information is confidential and restricted. Go to Review Flowsheets activity to see all data.   Foot/eye exam completion dates 06/03/2015 04/16/2015  Foot Form Completion Done Done

## 2020-01-06 NOTE — Assessment & Plan Note (Signed)
Uncontrolled.Toradol and depo medrol administered IM in the office , to be followed by a short course of oral prednisone and NSAIDS.  

## 2020-01-06 NOTE — Assessment & Plan Note (Signed)
Keflex and fluconazole prescribed

## 2020-01-06 NOTE — Progress Notes (Signed)
Tami Pearson     MRN: [id number]      DOB: 10-Sep-1965  HPI: Patient is in for annual physical exam. C/o uncontrolled back and generalized pain Recent labs,  are reviewed. Immunization is reviewed , and  updated if needed.   PE: BP 120/80    Pulse 83    Temp 98 F (36.7 C) (Oral)    Resp 16    Ht 5' 3.5" (1.613 m)    Wt 247 lb (112 kg)    LMP 03/18/2013    SpO2 99%    BMI 43.07 kg/m   Pleasant  female, alert and oriented x 3, in no cardio-pulmonary distress. Afebrile. HEENT No facial trauma or asymetry. Sinuses non tender.  Extra occullar muscles intact.. External ears normal, . Neck: supple, no adenopathy,JVD or thyromegaly.No bruits.  Chest: Clear to ascultation bilaterally.No crackles or wheezes. Non tender to palpation  Breast: No asymetry,no masses or lumps. No tenderness. No nipple discharge or inversion. Left tender axillary adenitis, normal exam in right axilla  Cardiovascular system; Heart sounds normal,  S1 and  S2 ,no S3.  No murmur, or thrill. Apical beat not displaced Peripheral pulses normal.  Abdomen: Soft, non tender, no organomegaly or masses. No bruits. Bowel sounds normal. No guarding, tenderness or rebound.   GU: External genitalia normal female genitalia , normal female distribution of hair. No lesions. Urethral meatus normal in size, no  Prolapse, no lesions visibly  Present. Bladder non tender. Vagina pink and moist , with no visible lesions , discharge present . Adequate pelvic support no  cystocele or rectocele noted Cervix pink and appears healthy, no lesions or ulcerations noted, no discharge noted from os Uterus normal size, no adnexal masses, no cervical motion or adnexal tenderness.   Musculoskeletal exam: Decreased ROM of spine,adequate in  hips , shoulders and knees. No deformity ,swelling or crepitus noted. No muscle wasting or atrophy.   Neurologic: Cranial nerves 2 to 12 intact. Power, tone ,sensation and reflexes  normal throughout.  disturbance in gait. No tremor.  Skin: Intact, no ulceration, erythema , scaling or rash noted. Pigmentation normal throughout  Psych; Normal mood and affect. Judgement and concentration normal   Assessment & Plan:  Encounter for Medicare annual examination with abnormal findings Annual exam as documented. Counseling done  re healthy lifestyle involving commitment to 150 minutes exercise per week, heart healthy diet, and attaining healthy weight.The importance of adequate sleep also discussed. Regular seat belt use and home safety, is also discussed. Changes in health habits are decided on by the patient with goals and time frames  set for achieving them. Immunization and cancer screening needs are specifically addressed at this visit.   IGT (impaired glucose tolerance) Deteriorated, needs to reduce carb intake , portion size and lose weight  Patient educated about the importance of limiting  Carbohydrate intake , the need to commit to daily physical activity for a minimum of 30 minutes , and to commit weight loss. The fact that changes in all these areas will reduce or eliminate all together the development of diabetes is stressed.   Diabetic Labs Latest Ref Rng & Units 01/01/2020 03/08/2018 10/18/2017 03/07/2017 01/06/2017  HbA1c 0.0 - 7.0 % 6.5 6.0(H) 5.9(H) - 5.8(H)  Microalbumin Not estab mg/dL - - - - -  Micro/Creat Ratio <30 mcg/mg creat - - - - -  Chol 100 - 199 mg/dL 803(O) - 122 - 482  HDL >39 mg/dL 48 - 60 - 56  Calc LDL 0 - 99 mg/dL 202(R) - 427(C) - 623(J)  Triglycerides 0 - 149 mg/dL 628 - 87 - 86  Creatinine 0.57 - 1.00 mg/dL 3.15 1.76 1.60 7.37(T) 1.12(H)   BP/Weight 01/01/2020 11/26/2019 11/13/2019 03/27/2019 02/27/2019 11/23/2018 10/25/2018  Systolic BP 120 134 134 124 124 124 124  Diastolic BP 80 78 78 80 80 80 80  Wt. (Lbs) 247 254 254 241 241 241 241  BMI 43.07 44.29 44.29 42.69 42.69 42.69 42.69  Some encounter information is confidential and  restricted. Go to Review Flowsheets activity to see all data.   Foot/eye exam completion dates 06/03/2015 04/16/2015  Foot Form Completion Done Done      Back pain with radiculopathy Uncontrolled.Toradol and depo medrol administered IM in the office , to be followed by a short course of oral prednisone and NSAIDS.   Morbid obesity  Patient re-educated about  the importance of commitment to a  minimum of 150 minutes of exercise per week as able.  The importance of healthy food choices with portion control discussed, as well as eating regularly and within a 12 hour window most days. The need to choose "clean , green" food 50 to 75% of the time is discussed, as well as to make water the primary drink and set a goal of 64 ounces water daily.    Weight /BMI 01/01/2020 11/26/2019 11/13/2019  WEIGHT 247 lb 254 lb 254 lb  HEIGHT 5' 3.5" 5' 3.5" 5' 3.5"  BMI 43.07 kg/m2 44.29 kg/m2 44.29 kg/m2  Some encounter information is confidential and restricted. Go to Review Flowsheets activity to see all data.      Axillary adenitis Keflex and fluconazole prescribed

## 2020-01-28 ENCOUNTER — Telehealth (HOSPITAL_COMMUNITY): Payer: Medicare HMO | Admitting: Psychiatry

## 2020-01-31 ENCOUNTER — Encounter (HOSPITAL_COMMUNITY): Payer: Self-pay | Admitting: Psychiatry

## 2020-01-31 ENCOUNTER — Telehealth (INDEPENDENT_AMBULATORY_CARE_PROVIDER_SITE_OTHER): Payer: Medicare HMO | Admitting: Psychiatry

## 2020-01-31 ENCOUNTER — Telehealth (HOSPITAL_COMMUNITY): Payer: Medicare Other | Admitting: Psychiatry

## 2020-01-31 ENCOUNTER — Other Ambulatory Visit: Payer: Self-pay

## 2020-01-31 DIAGNOSIS — F3341 Major depressive disorder, recurrent, in partial remission: Secondary | ICD-10-CM

## 2020-01-31 DIAGNOSIS — F5102 Adjustment insomnia: Secondary | ICD-10-CM

## 2020-01-31 MED ORDER — DULOXETINE HCL 60 MG PO CPEP: 60.0000 mg | ORAL_CAPSULE | Freq: Two times a day (BID) | ORAL | 2 refills | Status: DC

## 2020-01-31 MED ORDER — HYDROXYZINE HCL 50 MG PO TABS: ORAL_TABLET | ORAL | 1 refills | Status: DC

## 2020-01-31 MED ORDER — LORAZEPAM 0.5 MG PO TABS: 0.5000 mg | ORAL_TABLET | Freq: Every day | ORAL | 2 refills | Status: DC | PRN

## 2020-01-31 MED ORDER — BUPROPION HCL ER (XL) 150 MG PO TB24: 150.0000 mg | ORAL_TABLET | ORAL | 1 refills | Status: DC

## 2020-01-31 MED ORDER — TRAZODONE HCL 50 MG PO TABS: 50.0000 mg | ORAL_TABLET | Freq: Every day | ORAL | 1 refills | Status: DC
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
## 2020-02-18 DIAGNOSIS — Z7984 Long term (current) use of oral hypoglycemic drugs: Secondary | ICD-10-CM | POA: Diagnosis not present

## 2020-02-18 DIAGNOSIS — W540XXA Bitten by dog, initial encounter: Secondary | ICD-10-CM | POA: Diagnosis not present

## 2020-02-18 DIAGNOSIS — R58 Hemorrhage, not elsewhere classified: Secondary | ICD-10-CM | POA: Diagnosis not present

## 2020-02-18 DIAGNOSIS — E119 Type 2 diabetes mellitus without complications: Secondary | ICD-10-CM | POA: Diagnosis not present

## 2020-02-18 DIAGNOSIS — Z203 Contact with and (suspected) exposure to rabies: Secondary | ICD-10-CM | POA: Diagnosis not present

## 2020-02-18 DIAGNOSIS — S0185XA Open bite of other part of head, initial encounter: Secondary | ICD-10-CM | POA: Diagnosis not present

## 2020-02-18 DIAGNOSIS — I10 Essential (primary) hypertension: Secondary | ICD-10-CM | POA: Diagnosis not present

## 2020-02-18 DIAGNOSIS — S01551A Open bite of lip, initial encounter: Secondary | ICD-10-CM | POA: Diagnosis not present

## 2020-02-18 DIAGNOSIS — Z2914 Encounter for prophylactic rabies immune globin: Secondary | ICD-10-CM | POA: Diagnosis not present

## 2020-02-18 DIAGNOSIS — S01511A Laceration without foreign body of lip, initial encounter: Secondary | ICD-10-CM | POA: Diagnosis not present

## 2020-02-18 DIAGNOSIS — W5581XA Bitten by other mammals, initial encounter: Secondary | ICD-10-CM | POA: Diagnosis not present

## 2020-02-21 DIAGNOSIS — Z23 Encounter for immunization: Secondary | ICD-10-CM | POA: Diagnosis not present

## 2020-02-21 DIAGNOSIS — Z203 Contact with and (suspected) exposure to rabies: Secondary | ICD-10-CM | POA: Diagnosis not present

## 2020-02-22 DIAGNOSIS — Z4802 Encounter for removal of sutures: Secondary | ICD-10-CM | POA: Diagnosis not present

## 2020-02-22 DIAGNOSIS — Z48817 Encounter for surgical aftercare following surgery on the skin and subcutaneous tissue: Secondary | ICD-10-CM | POA: Diagnosis not present

## 2020-03-01 LAB — EXTERNAL GENERIC LAB PROCEDURE

## 2020-03-01 LAB — COLOGUARD

## 2020-03-05 DIAGNOSIS — Z1211 Encounter for screening for malignant neoplasm of colon: Secondary | ICD-10-CM | POA: Diagnosis not present

## 2020-03-14 LAB — COLOGUARD
COLOGUARD: NEGATIVE
Cologuard: NEGATIVE

## 2020-03-14 LAB — EXTERNAL GENERIC LAB PROCEDURE: COLOGUARD: NEGATIVE

## 2020-03-17 ENCOUNTER — Other Ambulatory Visit: Payer: Self-pay

## 2020-03-17 ENCOUNTER — Encounter (HOSPITAL_COMMUNITY): Payer: Self-pay | Admitting: Psychiatry

## 2020-03-17 ENCOUNTER — Telehealth (INDEPENDENT_AMBULATORY_CARE_PROVIDER_SITE_OTHER): Payer: Medicare HMO | Admitting: Psychiatry

## 2020-03-17 DIAGNOSIS — F3341 Major depressive disorder, recurrent, in partial remission: Secondary | ICD-10-CM

## 2020-03-17 DIAGNOSIS — F5102 Adjustment insomnia: Secondary | ICD-10-CM

## 2020-03-17 MED ORDER — LORAZEPAM 0.5 MG PO TABS
0.5000 mg | ORAL_TABLET | Freq: Every day | ORAL | 2 refills | Status: DC | PRN
Start: 2020-03-17 — End: 2020-06-10

## 2020-03-17 MED ORDER — BUPROPION HCL ER (XL) 150 MG PO TB24: 150.0000 mg | ORAL_TABLET | ORAL | 1 refills | Status: DC

## 2020-03-17 MED ORDER — HYDROXYZINE HCL 50 MG PO TABS: ORAL_TABLET | ORAL | 1 refills | Status: DC

## 2020-03-17 MED ORDER — TRAZODONE HCL 50 MG PO TABS: 50.0000 mg | ORAL_TABLET | Freq: Every day | ORAL | 1 refills | Status: DC

## 2020-03-17 MED ORDER — DULOXETINE HCL 60 MG PO CPEP: 60.0000 mg | ORAL_CAPSULE | Freq: Two times a day (BID) | ORAL | 2 refills | Status: DC
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
## 2020-03-18 ENCOUNTER — Telehealth (HOSPITAL_COMMUNITY): Payer: Self-pay | Admitting: *Deleted

## 2020-03-18 NOTE — Telephone Encounter (Signed)
Staff called patient to sch f/u appt and was not able to reach her and there's no voicemail box set up.

## 2020-03-19 ENCOUNTER — Ambulatory Visit: Payer: Medicare HMO | Admitting: Family Medicine

## 2020-03-20 ENCOUNTER — Telehealth (INDEPENDENT_AMBULATORY_CARE_PROVIDER_SITE_OTHER): Payer: Medicare HMO | Admitting: Family Medicine

## 2020-03-20 ENCOUNTER — Other Ambulatory Visit: Payer: Self-pay

## 2020-03-20 ENCOUNTER — Encounter: Payer: Self-pay | Admitting: Family Medicine

## 2020-03-20 VITALS — Ht 63.5 in | Wt 247.0 lb

## 2020-03-20 DIAGNOSIS — Z09 Encounter for follow-up examination after completed treatment for conditions other than malignant neoplasm: Secondary | ICD-10-CM

## 2020-03-20 DIAGNOSIS — I10 Essential (primary) hypertension: Secondary | ICD-10-CM

## 2020-03-20 DIAGNOSIS — E785 Hyperlipidemia, unspecified: Secondary | ICD-10-CM

## 2020-03-20 DIAGNOSIS — R7301 Impaired fasting glucose: Secondary | ICD-10-CM

## 2020-03-20 DIAGNOSIS — R7302 Impaired glucose tolerance (oral): Secondary | ICD-10-CM | POA: Diagnosis not present

## 2020-03-20 MED ORDER — PHENTERMINE HCL 37.5 MG PO TABS
37.5000 mg | ORAL_TABLET | Freq: Every day | ORAL | 1 refills | Status: DC
Start: 2020-03-20 — End: 2020-05-29

## 2020-03-20 NOTE — Patient Instructions (Addendum)
F/U in 2 months, call if you need me sooner  Please get non fast HBA1C, chem 7 and EGFr, 4 days before next visit  Phentermine is prescribed, weight loss goal of 4 pounds per month  Sorry that you have lost your dog, thankful that your injury is healing well   Calorie Counting for Weight Loss Calories are units of energy. Your body needs a certain number of calories from food to keep going throughout the day. When you eat or drink more calories than your body needs, your body stores the extra calories mostly as fat. When you eat or drink fewer calories than your body needs, your body burns fat to get the energy it needs. Calorie counting means keeping track of how many calories you eat and drink each day. Calorie counting can be helpful if you need to lose weight. If you eat fewer calories than your body needs, you should lose weight. Ask your health care provider what a healthy weight is for you. For calorie counting to work, you will need to eat the right number of calories each day to lose a healthy amount of weight per week. A dietitian can help you figure out how many calories you need in a day and will suggest ways to reach your calorie goal.  A healthy amount of weight to lose each week is usually 1-2 lb (0.5-0.9 kg). This usually means that your daily calorie intake should be reduced by 500-750 calories.  Eating 1,200-1,500 calories a day can help most women lose weight.  Eating 1,500-1,800 calories a day can help most men lose weight. What do I need to know about calorie counting? Work with your health care provider or dietitian to determine how many calories you should get each day. To meet your daily calorie goal, you will need to:  Find out how many calories are in each food that you would like to eat. Try to do this before you eat.  Decide how much of the food you plan to eat.  Keep a food log. Do this by writing down what you ate and how many calories it had. To successfully  lose weight, it is important to balance calorie counting with a healthy lifestyle that includes regular activity. Where do I find calorie information? The number of calories in a food can be found on a Nutrition Facts label. If a food does not have a Nutrition Facts label, try to look up the calories online or ask your dietitian for help. Remember that calories are listed per serving. If you choose to have more than one serving of a food, you will have to multiply the calories per serving by the number of servings you plan to eat. For example, the label on a package of bread might say that a serving size is 1 slice and that there are 90 calories in a serving. If you eat 1 slice, you will have eaten 90 calories. If you eat 2 slices, you will have eaten 180 calories.   How do I keep a food log? After each time that you eat, record the following in your food log as soon as possible:  What you ate. Be sure to include toppings, sauces, and other extras on the food.  How much you ate. This can be measured in cups, ounces, or number of items.  How many calories were in each food and drink.  The total number of calories in the food you ate. Keep your food log near  you, such as in a pocket-sized notebook or on an app or website on your mobile phone. Some programs will calculate calories for you and show you how many calories you have left to meet your daily goal. What are some portion-control tips?  Know how many calories are in a serving. This will help you know how many servings you can have of a certain food.  Use a measuring cup to measure serving sizes. You could also try weighing out portions on a kitchen scale. With time, you will be able to estimate serving sizes for some foods.  Take time to put servings of different foods on your favorite plates or in your favorite bowls and cups so you know what a serving looks like.  Try not to eat straight from a food's packaging, such as from a bag or  box. Eating straight from the package makes it hard to see how much you are eating and can lead to overeating. Put the amount you would like to eat in a cup or on a plate to make sure you are eating the right portion.  Use smaller plates, glasses, and bowls for smaller portions and to prevent overeating.  Try not to multitask. For example, avoid watching TV or using your computer while eating. If it is time to eat, sit down at a table and enjoy your food. This will help you recognize when you are full. It will also help you be more mindful of what and how much you are eating. What are tips for following this plan? Reading food labels  Check the calorie count compared with the serving size. The serving size may be smaller than what you are used to eating.  Check the source of the calories. Try to choose foods that are high in protein, fiber, and vitamins, and low in saturated fat, trans fat, and sodium. Shopping  Read nutrition labels while you shop. This will help you make healthy decisions about which foods to buy.  Pay attention to nutrition labels for low-fat or fat-free foods. These foods sometimes have the same number of calories or more calories than the full-fat versions. They also often have added sugar, starch, or salt to make up for flavor that was removed with the fat.  Make a grocery list of lower-calorie foods and stick to it. Cooking  Try to cook your favorite foods in a healthier way. For example, try baking instead of frying.  Use low-fat dairy products. Meal planning  Use more fruits and vegetables. One-half of your plate should be fruits and vegetables.  Include lean proteins, such as chicken, [place], and fish. Lifestyle Each week, aim to do one of the following:  150 minutes of moderate exercise, such as walking.  75 minutes of vigorous exercise, such as running. General information  Know how many calories are in the foods you eat most often. This will help you  calculate calorie counts faster.  Find a way of tracking calories that works for you. Get creative. Try different apps or programs if writing down calories does not work for you. What foods should I eat?  Eat nutritious foods. It is better to have a nutritious, high-calorie food, such as an avocado, than a food with few nutrients, such as a bag of potato chips.  Use your calories on foods and drinks that will fill you up and will not leave you hungry soon after eating. ? Examples of foods that fill you up are nuts and nut butters,  vegetables, lean proteins, and high-fiber foods such as whole grains. High-fiber foods are foods with more than 5 g of fiber per serving.  Pay attention to calories in drinks. Low-calorie drinks include water and unsweetened drinks. The items listed above may not be a complete list of foods and beverages you can eat. Contact a dietitian for more information.   What foods should I limit? Limit foods or drinks that are not good sources of vitamins, minerals, or protein or that are high in unhealthy fats. These include:  Candy.  Other sweets.  Sodas, specialty coffee drinks, alcohol, and juice. The items listed above may not be a complete list of foods and beverages you should avoid. Contact a dietitian for more information. How do I count calories when eating out?  Pay attention to portions. Often, portions are much larger when eating out. Try these tips to keep portions smaller: ? Consider sharing a meal instead of getting your own. ? If you get your own meal, eat only half of it. Before you start eating, ask for a container and put half of your meal into it. ? When available, consider ordering smaller portions from the menu instead of full portions.  Pay attention to your food and drink choices. Knowing the way food is cooked and what is included with the meal can help you eat fewer calories. ? If calories are listed on the menu, choose the lower-calorie  options. ? Choose dishes that include vegetables, fruits, whole grains, low-fat dairy products, and lean proteins. ? Choose items that are boiled, broiled, grilled, or steamed. Avoid items that are buttered, battered, fried, or served with cream sauce. Items labeled as crispy are usually fried, unless stated otherwise. ? Choose water, low-fat milk, unsweetened iced tea, or other drinks without added sugar. If you want an alcoholic beverage, choose a lower-calorie option, such as a glass of wine or light beer. ? Ask for dressings, sauces, and syrups on the side. These are usually high in calories, so you should limit the amount you eat. ? If you want a salad, choose a garden salad and ask for grilled meats. Avoid extra toppings such as bacon, cheese, or fried items. Ask for the dressing on the side, or ask for olive oil and vinegar or lemon to use as dressing.  Estimate how many servings of a food you are given. Knowing serving sizes will help you be aware of how much food you are eating at restaurants. Where to find more information  Centers for Disease Control and Prevention: http://www.wolf.info/  [place] Department of Agriculture: http://www.wilson-mendoza.org/ Summary  Calorie counting means keeping track of how many calories you eat and drink each day. If you eat fewer calories than your body needs, you should lose weight.  A healthy amount of weight to lose per week is usually 1-2 lb (0.5-0.9 kg). This usually means reducing your daily calorie intake by 500-750 calories.  The number of calories in a food can be found on a Nutrition Facts label. If a food does not have a Nutrition Facts label, try to look up the calories online or ask your dietitian for help.  Use smaller plates, glasses, and bowls for smaller portions and to prevent overeating.  Use your calories on foods and drinks that will fill you up and not leave you hungry shortly after a meal. This information is not intended to replace advice given to you by  your health care provider. Make sure you discuss any questions you have  with your health care provider. Document Revised: 03/01/2019 Document Reviewed: 03/01/2019 Elsevier Patient Education  2021 [employer].

## 2020-03-23 ENCOUNTER — Encounter: Payer: Self-pay | Admitting: Family Medicine

## 2020-03-23 NOTE — Progress Notes (Signed)
Virtual Visit via Video Note  I connected with Tami Pearson on 03/23/20 at  2:20 PM EST by a video enabled telemedicine application and verified that I am speaking with the correct person using two identifiers.  Location: Patient: home Provider: office   I discussed the limitations of evaluation and management by telemedicine and the availability of in person appointments. The patient expressed understanding and agreed to proceed.  History of Present Illness: Post ED follow up for dog bite on 02/18/2020, when she was bitten by the dog she owned for 7 years. Significant facial trauma involving lip, plastic recommended, but has actually healed well on it's own and she is not interested in plastics  Grieving the loss of her dog which was put down ,as it was a gift following the loss of her Mom No weight loss in past several months, but intends to get back on this seriously and requests med refill for help Denies recent fever or chills. Denies sinus pressure, nasal congestion, ear pain or sore throat. Denies chest congestion, productive cough or wheezing. Denies chest pains, palpitations and leg swelling Denies abdominal pain, nausea, vomiting,diarrhea or constipation.   Denies dysuria, frequency, hesitancy or incontinence. Denies joint pain, swelling and limitation in mobility. Denies headaches, seizures, numbness, or tingling.        Observations/Objective: Ht 5' 3.5" (1.613 m)   Wt 247 lb (112 kg)   LMP 03/18/2013   BMI 43.07 kg/m  Good communication with no confusion and intact memory. Alert and oriented x 3 No signs of respiratory distress during speech  Left facial swelling , left upper lip swollen, no erythema  Or drainage, skin closed  Assessment and Plan: Encounter for examination following treatment at hospital 2 ED visits for dog bite to  Face on 02/08/2020,  resulting in significant trauma, currently well healed  Essential hypertension DASH diet and  commitment to daily physical activity for a minimum of 30 minutes discussed and encouraged, as a part of hypertension management. The importance of attaining a healthy weight is also discussed. Controlled, no change in medication   BP/Weight 03/20/2020 01/01/2020 11/26/2019 11/13/2019 03/27/2019 02/27/2019 11/23/2018  Systolic BP - 120 134 134 124 921 124  Diastolic BP - 80 78 78 80 80 80  Wt. (Lbs) 247 247 254 254 241 241 241  BMI 43.07 43.07 44.29 44.29 42.69 42.69 42.69  Some encounter information is confidential and restricted. Go to Review Flowsheets activity to see all data.       Morbid obesity  Patient re-educated about  the importance of commitment to a  minimum of 150 minutes of exercise per week as able.  The importance of healthy food choices with portion control discussed, as well as eating regularly and within a 12 hour window most days. The need to choose "clean , green" food 50 to 75% of the time is discussed, as well as to make water the primary drink and set a goal of 64 ounces water daily.    Weight /BMI 03/20/2020 01/01/2020 11/26/2019  WEIGHT 247 lb 247 lb 254 lb  HEIGHT 5' 3.5" 5' 3.5" 5' 3.5"  BMI 43.07 kg/m2 43.07 kg/m2 44.29 kg/m2  Some encounter information is confidential and restricted. Go to Review Flowsheets activity to see all data.      Hyperlipidemia LDL goal <100 Hyperlipidemia:Low fat diet discussed and encouraged.   Lipid Panel  Lab Results  Component Value Date   CHOL 209 (H) 01/01/2020   HDL 48  01/01/2020   LDLCALC 140 (H) 01/01/2020   TRIG 119 01/01/2020   CHOLHDL 4.4 01/01/2020  uncontrolled , needs to lower fat intake, Updated lab needed at/ before next visit.      IGT (impaired glucose tolerance) Patient educated about the importance of limiting  Carbohydrate intake , the need to commit to daily physical activity for a minimum of 30 minutes , and to commit weight loss. The fact that changes in all these areas will reduce or  eliminate all together the development of diabetes is stressed.   Diabetic Labs Latest Ref Rng & Units 01/01/2020 03/08/2018 10/18/2017 03/07/2017 01/06/2017  HbA1c 0.0 - 7.0 % 6.5 6.0(H) 5.9(H) - 5.8(H)  Microalbumin Not estab mg/dL - - - - -  Micro/Creat Ratio <30 mcg/mg creat - - - - -  Chol 100 - 199 mg/dL 867(E) - 720 - 947  HDL >39 mg/dL 48 - 60 - 56  Calc LDL 0 - 99 mg/dL 096(G) - 836(O) - 294(T)  Triglycerides 0 - 149 mg/dL 654 - 87 - 86  Creatinine 0.57 - 1.00 mg/dL 6.50 3.54 6.56 8.12(X) 1.12(H)   BP/Weight 03/20/2020 01/01/2020 11/26/2019 11/13/2019 03/27/2019 02/27/2019 11/23/2018  Systolic BP - 120 134 134 124 517 124  Diastolic BP - 80 78 78 80 80 80  Wt. (Lbs) 247 247 254 254 241 241 241  BMI 43.07 43.07 44.29 44.29 42.69 42.69 42.69  Some encounter information is confidential and restricted. Go to Review Flowsheets activity to see all data.   Foot/eye exam completion dates 06/03/2015 04/16/2015  Foot Form Completion Done Done    Updated lab needed at/ before next visit.     Follow Up Instructions:    I discussed the assessment and treatment plan with the patient. The patient was provided an opportunity to ask questions and all were answered. The patient agreed with the plan and demonstrated an understanding of the instructions.   The patient was advised to call back or seek an in-person evaluation if the symptoms worsen or if the condition fails to improve as anticipated.  I provided 24 minutes of non-face-to-face time during this encounter.   Syliva Overman, MD

## 2020-03-23 NOTE — Assessment & Plan Note (Signed)
2 ED visits for dog bite to  Face on 02/08/2020,  resulting in significant trauma, currently well healed

## 2020-03-23 NOTE — Assessment & Plan Note (Signed)
Patient educated about the importance of limiting  Carbohydrate intake , the need to commit to daily physical activity for a minimum of 30 minutes , and to commit weight loss. The fact that changes in all these areas will reduce or eliminate all together the development of diabetes is stressed.   Diabetic Labs Latest Ref Rng & Units 01/01/2020 03/08/2018 10/18/2017 03/07/2017 01/06/2017  HbA1c 0.0 - 7.0 % 6.5 6.0(H) 5.9(H) - 5.8(H)  Microalbumin Not estab mg/dL - - - - -  Micro/Creat Ratio <30 mcg/mg creat - - - - -  Chol 100 - 199 mg/dL 381(R) - 711 - 657  HDL >39 mg/dL 48 - 60 - 56  Calc LDL 0 - 99 mg/dL 903(Y) - 333(O) - 329(V)  Triglycerides 0 - 149 mg/dL 916 - 87 - 86  Creatinine 0.57 - 1.00 mg/dL 6.06 0.04 5.99 7.74(F) 1.12(H)   BP/Weight 03/20/2020 01/01/2020 11/26/2019 11/13/2019 03/27/2019 02/27/2019 11/23/2018  Systolic BP - 120 134 134 124 423 124  Diastolic BP - 80 78 78 80 80 80  Wt. (Lbs) 247 247 254 254 241 241 241  BMI 43.07 43.07 44.29 44.29 42.69 42.69 42.69  Some encounter information is confidential and restricted. Go to Review Flowsheets activity to see all data.   Foot/eye exam completion dates 06/03/2015 04/16/2015  Foot Form Completion Done Done    Updated lab needed at/ before next visit.

## 2020-03-23 NOTE — Assessment & Plan Note (Signed)
  Patient re-educated about  the importance of commitment to a  minimum of 150 minutes of exercise per week as able.  The importance of healthy food choices with portion control discussed, as well as eating regularly and within a 12 hour window most days. The need to choose "clean , green" food 50 to 75% of the time is discussed, as well as to make water the primary drink and set a goal of 64 ounces water daily.    Weight /BMI 03/20/2020 01/01/2020 11/26/2019  WEIGHT 247 lb 247 lb 254 lb  HEIGHT 5' 3.5" 5' 3.5" 5' 3.5"  BMI 43.07 kg/m2 43.07 kg/m2 44.29 kg/m2  Some encounter information is confidential and restricted. Go to Review Flowsheets activity to see all data.

## 2020-03-23 NOTE — Assessment & Plan Note (Signed)
Hyperlipidemia:Low fat diet discussed and encouraged.   Lipid Panel  Lab Results  Component Value Date   CHOL 209 (H) 01/01/2020   HDL 48 01/01/2020   LDLCALC 140 (H) 01/01/2020   TRIG 119 01/01/2020   CHOLHDL 4.4 01/01/2020  uncontrolled , needs to lower fat intake, Updated lab needed at/ before next visit.

## 2020-03-23 NOTE — Assessment & Plan Note (Signed)
DASH diet and commitment to daily physical activity for a minimum of 30 minutes discussed and encouraged, as a part of hypertension management. The importance of attaining a healthy weight is also discussed. Controlled, no change in medication   BP/Weight 03/20/2020 01/01/2020 11/26/2019 11/13/2019 03/27/2019 02/27/2019 11/23/2018  Systolic BP - 120 134 134 124 916 124  Diastolic BP - 80 78 78 80 80 80  Wt. (Lbs) 247 247 254 254 241 241 241  BMI 43.07 43.07 44.29 44.29 42.69 42.69 42.69  Some encounter information is confidential and restricted. Go to Review Flowsheets activity to see all data.

## 2020-04-16 ENCOUNTER — Telehealth (HOSPITAL_COMMUNITY): Payer: Self-pay | Admitting: Psychiatry

## 2020-04-16 NOTE — Telephone Encounter (Signed)
Called to schedule f/u appt unable to leave vm due to no mailbox set up

## 2020-04-21 ENCOUNTER — Ambulatory Visit (HOSPITAL_COMMUNITY): Payer: Medicare HMO

## 2020-05-29 ENCOUNTER — Telehealth (INDEPENDENT_AMBULATORY_CARE_PROVIDER_SITE_OTHER): Payer: Medicare HMO | Admitting: Family Medicine

## 2020-05-29 ENCOUNTER — Other Ambulatory Visit: Payer: Self-pay

## 2020-05-29 ENCOUNTER — Encounter: Payer: Self-pay | Admitting: Family Medicine

## 2020-05-29 VITALS — Ht 63.5 in | Wt 242.0 lb

## 2020-05-29 DIAGNOSIS — F3341 Major depressive disorder, recurrent, in partial remission: Secondary | ICD-10-CM | POA: Diagnosis not present

## 2020-05-29 DIAGNOSIS — E785 Hyperlipidemia, unspecified: Secondary | ICD-10-CM | POA: Diagnosis not present

## 2020-05-29 DIAGNOSIS — R7302 Impaired glucose tolerance (oral): Secondary | ICD-10-CM | POA: Diagnosis not present

## 2020-05-29 DIAGNOSIS — I10 Essential (primary) hypertension: Secondary | ICD-10-CM

## 2020-05-29 MED ORDER — PHENTERMINE HCL 37.5 MG PO TABS: 37.5000 mg | ORAL_TABLET | Freq: Every day | ORAL | 1 refills | Status: DC

## 2020-05-29 NOTE — Patient Instructions (Signed)
F/U in office in 2 months, re evaluate weight and blood pressure, call if you need me sooner  Nurse please locate most recent mammogram from [place] [facility] in [place]  Fasting lipid, cmp and EGFr and hBa1C in the next 1 to 2 weeks  It is important that you exercise regularly at least 30 minutes 5 times a week. If you develop chest pain, have severe difficulty breathing, or feel very tired, stop exercising immediately and seek medical attention   Think about what you will eat, plan ahead. Choose " clean, green, fresh or frozen" over canned, processed or packaged foods which are more sugary, salty and fatty. 70 to 75% of food eaten should be vegetables and fruit. Three meals at set times with snacks allowed between meals, but they must be fruit or vegetables. Aim to eat over a 12 hour period , example 7 am to 7 pm, and STOP after  your last meal of the day. Drink water,generally about 64 ounces per day, no other drink is as healthy. Fruit juice is best enjoyed in a healthy way, by EATING the fruit. Thanks for choosing [facility], we consider it a privelige to serve you.

## 2020-05-29 NOTE — Progress Notes (Signed)
Virtual Visit via Telephone Note  I connected with Tami Pearson on 05/29/20 at  2:40 PM EDT by telephone and verified that I am speaking with the correct person using two identifiers.  Location: Patient: home Provider: office   I discussed the limitations, risks, security and privacy concerns of performing an evaluation and management service by telephone and the availability of in person appointments. I also discussed with the patient that there may be a patient responsible charge related to this service. The patient expressed understanding and agreed to proceed.   History of Present Illness: /U chronic problems. Stats she is doing well ovrall, wants to continue phentermine for weight loss, reporting in good blood pressure and acceptable weight loss Denies recent fever or chills. Denies sinus pressure, nasal congestion, ear pain or sore throat. Denies chest congestion, productive cough or wheezing. Denies chest pains, palpitations and leg swelling Denies abdominal pain, nausea, vomiting,diarrhea or constipation.   Denies dysuria, frequency, hesitancy or incontinence. Denies uncontrolled joint pain, swelling and limitation in mobility. Denies headaches, seizures, numbness, or tingling. Denies uncontrolled  depression, anxiety or insomnia. Denies skin break down or rash.       Observations/Objective: Ht 5' 3.5" (1.613 m)   Wt 242 lb (109.8 kg)   LMP 03/18/2013   BMI 42.20 kg/m  Good communication with no confusion and intact memory. Alert and oriented x 3 No signs of respiratory distress during speech    Assessment and Plan: Essential hypertension Needs in office eval DASH diet and commitment to daily physical activity for a minimum of 30 minutes discussed and encouraged, as a part of hypertension management. The importance of attaining a healthy weight is also discussed.  BP/Weight 05/29/2020 03/20/2020 01/01/2020 11/26/2019 11/13/2019 03/27/2019 02/27/2019  Systolic  BP - - 703 134 134 124 124  Diastolic BP - - 80 78 78 80 80  Wt. (Lbs) 242 247 247 254 254 241 241  BMI 42.2 43.07 43.07 44.29 44.29 42.69 42.69  Some encounter information is confidential and restricted. Go to Review Flowsheets activity to see all data.       Morbid obesity  Patient re-educated about  the importance of commitment to a  minimum of 150 minutes of exercise per week as able.  The importance of healthy food choices with portion control discussed, as well as eating regularly and within a 12 hour window most days. The need to choose "clean , green" food 50 to 75% of the time is discussed, as well as to make water the primary drink and set a goal of 64 ounces water daily.    Weight /BMI 05/29/2020 03/20/2020 01/01/2020  WEIGHT 242 lb 247 lb 247 lb  HEIGHT 5' 3.5" 5' 3.5" 5' 3.5"  BMI 42.2 kg/m2 43.07 kg/m2 43.07 kg/m2  Some encounter information is confidential and restricted. Go to Review Flowsheets activity to see all data.      IGT (impaired glucose tolerance) Patient educated about the importance of limiting  Carbohydrate intake , the need to commit to daily physical activity for a minimum of 30 minutes , and to commit weight loss. The fact that changes in all these areas will reduce or eliminate all together the development of diabetes is stressed.   Diabetic Labs Latest Ref Rng & Units 01/01/2020 03/08/2018 10/18/2017 03/07/2017 01/06/2017  HbA1c 0.0 - 7.0 % 6.5 6.0(H) 5.9(H) - 5.8(H)  Microalbumin Not estab mg/dL - - - - -  Micro/Creat Ratio <30 mcg/mg creat - - - - -  Chol 100 - 199 mg/dL 144(R) - 154 - 008  HDL >39 mg/dL 48 - 60 - 56  Calc LDL 0 - 99 mg/dL 676(P) - 950(D) - 326(Z)  Triglycerides 0 - 149 mg/dL 124 - 87 - 86  Creatinine 0.57 - 1.00 mg/dL 5.80 9.98 3.38 2.50(N) 1.12(H)   BP/Weight 05/29/2020 03/20/2020 01/01/2020 11/26/2019 11/13/2019 03/27/2019 02/27/2019  Systolic BP - - 120 134 134 124 124  Diastolic BP - - 80 78 78 80 80  Wt. (Lbs) 242 247 247 254  254 241 241  BMI 42.2 43.07 43.07 44.29 44.29 42.69 42.69  Some encounter information is confidential and restricted. Go to Review Flowsheets activity to see all data.   Foot/eye exam completion dates 06/03/2015 04/16/2015  Foot Form Completion Done Done  Updated lab needed at/ before next visit.     Hyperlipidemia LDL goal <100 Hyperlipidemia:Low fat diet discussed and encouraged.   Lipid Panel  Lab Results  Component Value Date   CHOL 209 (H) 01/01/2020   HDL 48 01/01/2020   LDLCALC 140 (H) 01/01/2020   TRIG 119 01/01/2020   CHOLHDL 4.4 01/01/2020  Updated lab needed at/ before next visit. Candidate for statin, will follow closely next labs     Major depression in partial remission (HCC) Stable continue psych management    Follow Up Instructions:    I discussed the assessment and treatment plan with the patient. The patient was provided an opportunity to ask questions and all were answered. The patient agreed with the plan and demonstrated an understanding of the instructions.   The patient was advised to call back or seek an in-person evaluation if the symptoms worsen or if the condition fails to improve as anticipated.  I provided 21 minutes of non-face-to-face time during this encounter.   Syliva Overman, MD

## 2020-05-31 ENCOUNTER — Encounter: Payer: Self-pay | Admitting: Family Medicine

## 2020-05-31 NOTE — Assessment & Plan Note (Addendum)
Needs in office eval DASH diet and commitment to daily physical activity for a minimum of 30 minutes discussed and encouraged, as a part of hypertension management. The importance of attaining a healthy weight is also discussed.  BP/Weight 05/29/2020 03/20/2020 01/01/2020 11/26/2019 11/13/2019 03/27/2019 02/27/2019  Systolic BP - - 120 134 134 124 124  Diastolic BP - - 80 78 78 80 80  Wt. (Lbs) 242 247 247 254 254 241 241  BMI 42.2 43.07 43.07 44.29 44.29 42.69 42.69  Some encounter information is confidential and restricted. Go to Review Flowsheets activity to see all data.

## 2020-05-31 NOTE — Assessment & Plan Note (Signed)
  Patient re-educated about  the importance of commitment to a  minimum of 150 minutes of exercise per week as able.  The importance of healthy food choices with portion control discussed, as well as eating regularly and within a 12 hour window most days. The need to choose "clean , green" food 50 to 75% of the time is discussed, as well as to make water the primary drink and set a goal of 64 ounces water daily.    Weight /BMI 05/29/2020 03/20/2020 01/01/2020  WEIGHT 242 lb 247 lb 247 lb  HEIGHT 5' 3.5" 5' 3.5" 5' 3.5"  BMI 42.2 kg/m2 43.07 kg/m2 43.07 kg/m2  Some encounter information is confidential and restricted. Go to Review Flowsheets activity to see all data.

## 2020-05-31 NOTE — Assessment & Plan Note (Signed)
Hyperlipidemia:Low fat diet discussed and encouraged.   Lipid Panel  Lab Results  Component Value Date   CHOL 209 (H) 01/01/2020   HDL 48 01/01/2020   LDLCALC 140 (H) 01/01/2020   TRIG 119 01/01/2020   CHOLHDL 4.4 01/01/2020  Updated lab needed at/ before next visit. Candidate for statin, will follow closely next labs

## 2020-05-31 NOTE — Assessment & Plan Note (Signed)
Stable continue psych management

## 2020-05-31 NOTE — Assessment & Plan Note (Signed)
Patient educated about the importance of limiting  Carbohydrate intake , the need to commit to daily physical activity for a minimum of 30 minutes , and to commit weight loss. The fact that changes in all these areas will reduce or eliminate all together the development of diabetes is stressed.   Diabetic Labs Latest Ref Rng & Units 01/01/2020 03/08/2018 10/18/2017 03/07/2017 01/06/2017  HbA1c 0.0 - 7.0 % 6.5 6.0(H) 5.9(H) - 5.8(H)  Microalbumin Not estab mg/dL - - - - -  Micro/Creat Ratio <30 mcg/mg creat - - - - -  Chol 100 - 199 mg/dL 938(H) - 829 - 937  HDL >39 mg/dL 48 - 60 - 56  Calc LDL 0 - 99 mg/dL 169(C) - 789(F) - 810(F)  Triglycerides 0 - 149 mg/dL 751 - 87 - 86  Creatinine 0.57 - 1.00 mg/dL 0.25 8.52 7.78 2.42(P) 1.12(H)   BP/Weight 05/29/2020 03/20/2020 01/01/2020 11/26/2019 11/13/2019 03/27/2019 02/27/2019  Systolic BP - - 120 134 134 124 124  Diastolic BP - - 80 78 78 80 80  Wt. (Lbs) 242 247 247 254 254 241 241  BMI 42.2 43.07 43.07 44.29 44.29 42.69 42.69  Some encounter information is confidential and restricted. Go to Review Flowsheets activity to see all data.   Foot/eye exam completion dates 06/03/2015 04/16/2015  Foot Form Completion Done Done  Updated lab needed at/ before next visit.

## 2020-06-10 ENCOUNTER — Telehealth: Payer: Self-pay

## 2020-06-10 ENCOUNTER — Telehealth (INDEPENDENT_AMBULATORY_CARE_PROVIDER_SITE_OTHER): Payer: Medicare HMO | Admitting: Psychiatry

## 2020-06-10 ENCOUNTER — Other Ambulatory Visit: Payer: Self-pay

## 2020-06-10 ENCOUNTER — Encounter (HOSPITAL_COMMUNITY): Payer: Self-pay | Admitting: Psychiatry

## 2020-06-10 DIAGNOSIS — F3341 Major depressive disorder, recurrent, in partial remission: Secondary | ICD-10-CM | POA: Diagnosis not present

## 2020-06-10 DIAGNOSIS — F5102 Adjustment insomnia: Secondary | ICD-10-CM | POA: Diagnosis not present

## 2020-06-10 MED ORDER — DULOXETINE HCL 60 MG PO CPEP: 60.0000 mg | ORAL_CAPSULE | Freq: Two times a day (BID) | ORAL | 2 refills | Status: DC

## 2020-06-10 MED ORDER — HYDROXYZINE HCL 50 MG PO TABS
ORAL_TABLET | ORAL | 1 refills | Status: DC
Start: 2020-06-10 — End: 2020-09-09

## 2020-06-10 MED ORDER — BUPROPION HCL ER (XL) 150 MG PO TB24: 150.0000 mg | ORAL_TABLET | ORAL | 1 refills | Status: DC

## 2020-06-10 MED ORDER — TRAZODONE HCL 50 MG PO TABS: 50.0000 mg | ORAL_TABLET | Freq: Every day | ORAL | 1 refills | Status: DC

## 2020-06-10 MED ORDER — LORAZEPAM 0.5 MG PO TABS: 0.5000 mg | ORAL_TABLET | Freq: Every day | ORAL | 2 refills | Status: DC | PRN
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
## 2020-06-10 NOTE — Telephone Encounter (Signed)
Patient returning call. cb # [phone]

## 2020-06-11 NOTE — Telephone Encounter (Signed)
Did not call patient and didn't see a note to call pt. Tried to call and let her know but no answer and VM not set up to be able to leave a vm. Will notify if pt calls back

## 2020-06-12 ENCOUNTER — Telehealth: Payer: Self-pay

## 2020-06-12 NOTE — Telephone Encounter (Signed)
Pt wants to know if you can fill out a new handicap sticker form for her and mail it to her when it's ready.

## 2020-06-12 NOTE — Telephone Encounter (Signed)
See note

## 2020-06-12 NOTE — Telephone Encounter (Signed)
Pt left voicemail returning your call. Please advise.

## 2020-06-12 NOTE — Telephone Encounter (Signed)
I haven't called her and lori already sent that message and I called her back to tell her that and got vm. Can u let her know if she calls back that I don't see anything in her chart

## 2020-06-17 NOTE — Telephone Encounter (Signed)
Handicapped sticker is filled out waiting on dr signature. Needs to be mailed once done.

## 2020-07-23 ENCOUNTER — Other Ambulatory Visit: Payer: Self-pay | Admitting: Internal Medicine

## 2020-07-23 DIAGNOSIS — I10 Essential (primary) hypertension: Secondary | ICD-10-CM

## 2020-07-29 ENCOUNTER — Ambulatory Visit: Payer: Medicare HMO | Admitting: Family Medicine

## 2020-08-11 ENCOUNTER — Encounter: Payer: Self-pay | Admitting: Family Medicine

## 2020-08-11 DIAGNOSIS — Z1231 Encounter for screening mammogram for malignant neoplasm of breast: Secondary | ICD-10-CM | POA: Diagnosis not present

## 2020-08-19 ENCOUNTER — Ambulatory Visit (INDEPENDENT_AMBULATORY_CARE_PROVIDER_SITE_OTHER): Payer: Medicare HMO | Admitting: Family Medicine

## 2020-08-19 ENCOUNTER — Encounter: Payer: Self-pay | Admitting: Family Medicine

## 2020-08-19 ENCOUNTER — Other Ambulatory Visit: Payer: Self-pay

## 2020-08-19 VITALS — BP 138/88 | HR 72 | Resp 16 | Ht 64.0 in | Wt 240.1 lb

## 2020-08-19 DIAGNOSIS — G8929 Other chronic pain: Secondary | ICD-10-CM

## 2020-08-19 DIAGNOSIS — F3341 Major depressive disorder, recurrent, in partial remission: Secondary | ICD-10-CM | POA: Diagnosis not present

## 2020-08-19 DIAGNOSIS — I10 Essential (primary) hypertension: Secondary | ICD-10-CM | POA: Diagnosis not present

## 2020-08-19 DIAGNOSIS — R7302 Impaired glucose tolerance (oral): Secondary | ICD-10-CM | POA: Diagnosis not present

## 2020-08-19 DIAGNOSIS — M25562 Pain in left knee: Secondary | ICD-10-CM

## 2020-08-19 LAB — POCT GLYCOSYLATED HEMOGLOBIN (HGB A1C): HbA1c, POC (controlled diabetic range): 6.7 % (ref 0.0–7.0)

## 2020-08-19 MED ORDER — KETOROLAC TROMETHAMINE 60 MG/2ML IM SOLN
60.0000 mg | Freq: Once | INTRAMUSCULAR | Status: AC
  Administered 2020-08-19: 60 mg via INTRAMUSCULAR

## 2020-08-19 MED ORDER — METHYLPREDNISOLONE ACETATE 80 MG/ML IJ SUSP
80.0000 mg | Freq: Once | INTRAMUSCULAR | Status: AC
  Administered 2020-08-19: 80 mg via INTRAMUSCULAR

## 2020-08-19 MED ORDER — PREDNISONE 5 MG (21) PO TBPK: 5.0000 mg | ORAL_TABLET | ORAL | 0 refills | Status: DC

## 2020-08-19 MED ORDER — PHENTERMINE HCL 37.5 MG PO TABS: 37.5000 mg | ORAL_TABLET | Freq: Every day | ORAL | 3 refills | Status: DC

## 2020-08-19 NOTE — Assessment & Plan Note (Signed)
Controlled and treated by Psych

## 2020-08-19 NOTE — Assessment & Plan Note (Signed)
Controlled, no change in medication DASH diet and commitment to daily physical activity for a minimum of 30 minutes discussed and encouraged, as a part of hypertension management. The importance of attaining a healthy weight is also discussed.  BP/Weight 08/19/2020 05/29/2020 03/20/2020 01/01/2020 11/26/2019 11/13/2019 03/27/2019  Systolic BP 138 - - 120 134 134 124  Diastolic BP 88 - - 80 78 78 80  Wt. (Lbs) 240.12 242 247 247 254 254 241  BMI 41.22 42.2 43.07 43.07 44.29 44.29 42.69  Some encounter information is confidential and restricted. Go to Review Flowsheets activity to see all data.

## 2020-08-19 NOTE — Assessment & Plan Note (Signed)
  Patient re-educated about  the importance of commitment to a  minimum of 150 minutes of exercise per week as able.  The importance of healthy food choices with portion control discussed, as well as eating regularly and within a 12 hour window most days. The need to choose "clean , green" food 50 to 75% of the time is discussed, as well as to make water the primary drink and set a goal of 64 ounces water daily.    Weight /BMI 08/19/2020 05/29/2020 03/20/2020  WEIGHT 240 lb 1.9 oz 242 lb 247 lb  HEIGHT 5\' 4"  5' 3.5" 5' 3.5"  BMI 41.22 kg/m2 42.2 kg/m2 43.07 kg/m2  Some encounter information is confidential and restricted. Go to Review Flowsheets activity to see all data.    Resume phentermine one daily

## 2020-08-19 NOTE — Patient Instructions (Addendum)
F/U in 3 months reevaluate weight and blood pressure, call if you need me sooner.  New for weight management is phentermine 1 daily.  Please follow diet rich in vegetables and fruit and ensure you drink 64 ounces of water daily.  Glycohemoglobin in the office today.  For left knee pain Toradol 60 mg and Depo-Medrol 80 mg IM this is followed by a 6-day course of prednisone which I have sent to your local pharmacy.  Please check your local pharmacy to get the shingles vaccine next Shingrix these are 2 vaccines given 2 to 6 months apart.  Thanks for choosing [facility], we consider it a privelige to serve you.

## 2020-08-19 NOTE — Assessment & Plan Note (Signed)
Uncontrolled.Toradol and depo medrol administered IM in the office , to be followed by a short course of oral prednisone   

## 2020-08-19 NOTE — Progress Notes (Signed)
Tami Pearson     MRN: [id number]      DOB: 22-Jun-1965   HPI Ms. Leason is here for follow up and re-evaluation of chronic medical conditions, medication management and review of any available recent lab and radiology data.  Preventive health is updated, specifically  Cancer screening and Immunization.   Questions or concerns regarding consultations or procedures which the PT has had in the interim are  addressed. The PT denies any adverse reactions to current medications since the last visit.  C/o increased left knee pain, wants injections Wants help with weight loss, wants to resume meds again   ROS Denies recent fever or chills. Denies sinus pressure, nasal congestion, ear pain or sore throat. Denies chest congestion, productive cough or wheezing. Denies chest pains, palpitations and leg swelling Denies abdominal pain, nausea, vomiting,diarrhea or constipation.   Denies dysuria, frequency, hesitancy or incontinence. . Denies headaches, seizures, numbness, or tingling. Denies uncontrolled depression, anxiety or insomnia. Denies skin break down or rash.   PE  BP 138/88   Pulse 72   Resp 16   Ht 5\' 4"  (1.626 m)   Wt 240 lb 1.9 oz (108.9 kg)   LMP 03/18/2013   SpO2 95%   BMI 41.22 kg/m   Patient alert and oriented and in no cardiopulmonary distress.  HEENT: No facial asymmetry, EOMI,     Neck supple .  Chest: Clear to auscultation bilaterally.  CVS: S1, S2 no murmurs, no S3.Regular rate.  ABD: Soft non tender.   Ext: No edema  MS: decreased  ROM spine, ss and knees.swelling and crepitus of left knee  Skin: Intact, no ulcerations or rash noted.  Psych: Good eye contact, normal affect. Memory intact not anxious or depressed appearing.  CNS: CN 2-12 intact, power,  normal throughout.no focal deficits noted.   Assessment & Plan  Essential hypertension Controlled, no change in medication DASH diet and commitment to daily physical activity for a minimum  of 30 minutes discussed and encouraged, as a part of hypertension management. The importance of attaining a healthy weight is also discussed.  BP/Weight 08/19/2020 05/29/2020 03/20/2020 01/01/2020 11/26/2019 11/13/2019 03/27/2019  Systolic BP 138 - - 120 134 134 124  Diastolic BP 88 - - 80 78 78 80  Wt. (Lbs) 240.12 242 247 247 254 254 241  BMI 41.22 42.2 43.07 43.07 44.29 44.29 42.69  Some encounter information is confidential and restricted. Go to Review Flowsheets activity to see all data.       Morbid obesity  Patient re-educated about  the importance of commitment to a  minimum of 150 minutes of exercise per week as able.  The importance of healthy food choices with portion control discussed, as well as eating regularly and within a 12 hour window most days. The need to choose "clean , green" food 50 to 75% of the time is discussed, as well as to make water the primary drink and set a goal of 64 ounces water daily.    Weight /BMI 08/19/2020 05/29/2020 03/20/2020  WEIGHT 240 lb 1.9 oz 242 lb 247 lb  HEIGHT 5\' 4"  5' 3.5" 5' 3.5"  BMI 41.22 kg/m2 42.2 kg/m2 43.07 kg/m2  Some encounter information is confidential and restricted. Go to Review Flowsheets activity to see all data.    Resume phentermine one daily  Left knee pain Uncontrolled.Toradol and depo medrol administered IM in the office , to be followed by a short course of oral prednisone    IGT (impaired  glucose tolerance) Deteriorated Patient educated about the importance of limiting  Carbohydrate intake , the need to commit to daily physical activity for a minimum of 30 minutes , and to commit weight loss. The fact that changes in all these areas will reduce or eliminate all together the development of diabetes is stressed.   Diabetic Labs Latest Ref Rng & Units 08/19/2020 01/01/2020 03/08/2018 10/18/2017 03/07/2017  HbA1c 0.0 - 7.0 % 6.7 6.5 6.0(H) 5.9(H) -  Microalbumin Not estab mg/dL - - - - -  Micro/Creat Ratio <30 mcg/mg  creat - - - - -  Chol 100 - 199 mg/dL - 086(P) - 619 -  HDL >50 mg/dL - 48 - 60 -  Calc LDL 0 - 99 mg/dL - 932(I) - 712(W) -  Triglycerides 0 - 149 mg/dL - 580 - 87 -  Creatinine 0.57 - 1.00 mg/dL - 9.98 3.38 2.50 5.39(J)   BP/Weight 08/19/2020 05/29/2020 03/20/2020 01/01/2020 11/26/2019 11/13/2019 03/27/2019  Systolic BP 138 - - 120 134 134 124  Diastolic BP 88 - - 80 78 78 80  Wt. (Lbs) 240.12 242 247 247 254 254 241  BMI 41.22 42.2 43.07 43.07 44.29 44.29 42.69  Some encounter information is confidential and restricted. Go to Review Flowsheets activity to see all data.   Foot/eye exam completion dates 06/03/2015 04/16/2015  Foot Form Completion Done Done      Major depression in partial remission (HCC) Controlled and treated by Psych

## 2020-08-19 NOTE — Assessment & Plan Note (Signed)
Deteriorated Patient educated about the importance of limiting  Carbohydrate intake , the need to commit to daily physical activity for a minimum of 30 minutes , and to commit weight loss. The fact that changes in all these areas will reduce or eliminate all together the development of diabetes is stressed.   Diabetic Labs Latest Ref Rng & Units 08/19/2020 01/01/2020 03/08/2018 10/18/2017 03/07/2017  HbA1c 0.0 - 7.0 % 6.7 6.5 6.0(H) 5.9(H) -  Microalbumin Not estab mg/dL - - - - -  Micro/Creat Ratio <30 mcg/mg creat - - - - -  Chol 100 - 199 mg/dL - 384(Y) - 659 -  HDL >93 mg/dL - 48 - 60 -  Calc LDL 0 - 99 mg/dL - 570(V) - 779(T) -  Triglycerides 0 - 149 mg/dL - 903 - 87 -  Creatinine 0.57 - 1.00 mg/dL - 0.09 2.33 0.07 6.22(Q)   BP/Weight 08/19/2020 05/29/2020 03/20/2020 01/01/2020 11/26/2019 11/13/2019 03/27/2019  Systolic BP 138 - - 120 134 134 124  Diastolic BP 88 - - 80 78 78 80  Wt. (Lbs) 240.12 242 247 247 254 254 241  BMI 41.22 42.2 43.07 43.07 44.29 44.29 42.69  Some encounter information is confidential and restricted. Go to Review Flowsheets activity to see all data.   Foot/eye exam completion dates 06/03/2015 04/16/2015  Foot Form Completion Done Done

## 2020-09-09 ENCOUNTER — Encounter (HOSPITAL_COMMUNITY): Payer: Self-pay | Admitting: Psychiatry

## 2020-09-09 ENCOUNTER — Other Ambulatory Visit: Payer: Self-pay

## 2020-09-09 ENCOUNTER — Telehealth (INDEPENDENT_AMBULATORY_CARE_PROVIDER_SITE_OTHER): Payer: Medicare HMO | Admitting: Psychiatry

## 2020-09-09 DIAGNOSIS — F5102 Adjustment insomnia: Secondary | ICD-10-CM | POA: Diagnosis not present

## 2020-09-09 DIAGNOSIS — F3341 Major depressive disorder, recurrent, in partial remission: Secondary | ICD-10-CM

## 2020-09-09 MED ORDER — DULOXETINE HCL 60 MG PO CPEP: 60.0000 mg | ORAL_CAPSULE | Freq: Two times a day (BID) | ORAL | 2 refills | Status: DC

## 2020-09-09 MED ORDER — TRAZODONE HCL 50 MG PO TABS: 50.0000 mg | ORAL_TABLET | Freq: Every day | ORAL | 1 refills | Status: DC

## 2020-09-09 MED ORDER — LORAZEPAM 0.5 MG PO TABS: 0.5000 mg | ORAL_TABLET | Freq: Every day | ORAL | 2 refills | Status: DC | PRN

## 2020-09-09 MED ORDER — BUPROPION HCL ER (XL) 150 MG PO TB24: 150.0000 mg | ORAL_TABLET | ORAL | 1 refills | Status: DC
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
## 2020-11-19 ENCOUNTER — Telehealth (INDEPENDENT_AMBULATORY_CARE_PROVIDER_SITE_OTHER): Payer: Medicare HMO | Admitting: Family Medicine

## 2020-11-19 ENCOUNTER — Other Ambulatory Visit: Payer: Self-pay

## 2020-11-19 ENCOUNTER — Encounter: Payer: Self-pay | Admitting: Family Medicine

## 2020-11-19 VITALS — BP 120/80 | Ht 64.0 in | Wt 214.0 lb

## 2020-11-19 DIAGNOSIS — M25562 Pain in left knee: Secondary | ICD-10-CM

## 2020-11-19 DIAGNOSIS — G8929 Other chronic pain: Secondary | ICD-10-CM

## 2020-11-19 DIAGNOSIS — M170 Bilateral primary osteoarthritis of knee: Secondary | ICD-10-CM

## 2020-11-19 DIAGNOSIS — F3341 Major depressive disorder, recurrent, in partial remission: Secondary | ICD-10-CM

## 2020-11-19 DIAGNOSIS — E559 Vitamin D deficiency, unspecified: Secondary | ICD-10-CM

## 2020-11-19 DIAGNOSIS — R7302 Impaired glucose tolerance (oral): Secondary | ICD-10-CM

## 2020-11-19 DIAGNOSIS — M5442 Lumbago with sciatica, left side: Secondary | ICD-10-CM

## 2020-11-19 DIAGNOSIS — M25561 Pain in right knee: Secondary | ICD-10-CM

## 2020-11-19 DIAGNOSIS — I10 Essential (primary) hypertension: Secondary | ICD-10-CM | POA: Diagnosis not present

## 2020-11-19 DIAGNOSIS — E785 Hyperlipidemia, unspecified: Secondary | ICD-10-CM

## 2020-11-19 DIAGNOSIS — M5410 Radiculopathy, site unspecified: Secondary | ICD-10-CM

## 2020-11-19 MED ORDER — PHENTERMINE HCL 37.5 MG PO TABS: 37.5000 mg | ORAL_TABLET | Freq: Every day | ORAL | 1 refills | Status: DC

## 2020-11-19 NOTE — Patient Instructions (Addendum)
Annual exam in offic e with MD in mid December  Need to come for flu vaccine, labs and X ray of low back in next 2 week  You are referred to Dr Aline Brochure re knee pain  You will be referred to pain management based on need and x ray report  Labs are entered just come to office, fasting lipid, cmp and EGFr, cBC, hBA1C, TSH , vit D   2 months of phentermine is prescribed, congrats on weight loss,  keep it up!  Thanks for choosing [facility], we consider it a privelige to serve you.

## 2020-11-19 NOTE — Progress Notes (Signed)
Virtual Visit via Telephone Note  I connected with Turner Jodie Echevaria on 11/19/20 at  2:40 PM EDT by telephone and verified that I am speaking with the correct person using two identifiers.  Location: Patient: home Provider: office   I discussed the limitations, risks, security and privacy concerns of performing an evaluation and management service by telephone and the availability of in person appointments. I also discussed with the patient that there may be a patient responsible charge related to this service. The patient expressed understanding and agreed to proceed.   History of Present Illness:   covid positive last week, had chills and fever, better  now. Increased knee pain and swelling needs Ortho appt Working on weight loss to qualify for knee replacement which she needs C/o back pain wants referral to pain management, need baseline imaging studies Observations/Objective: BP 120/80   Ht 5\' 4"  (1.626 m)   Wt 214 lb (97.1 kg)   LMP 03/18/2013   BMI 36.73 kg/m  Good communication with no confusion and intact memory. Alert and oriented x 3 No signs of respiratory distress during speech   Assessment and Plan:  Major depression in partial remission (HCC) Controlled, no change in medication   Morbid obesity  Patient re-educated about  the importance of commitment to a  minimum of 150 minutes of exercise per week as able.  The importance of healthy food choices with portion control discussed, as well as eating regularly and within a 12 hour window most days. The need to choose "clean , green" food 50 to 75% of the time is discussed, as well as to make water the primary drink and set a goal of 64 ounces water daily.    Weight /BMI 11/19/2020 08/19/2020 05/29/2020  WEIGHT 214 lb 240 lb 1.9 oz 242 lb  HEIGHT 5\' 4"  5\' 4"  5' 3.5"  BMI 36.73 kg/m2 41.22 kg/m2 42.2 kg/m2  Some encounter information is confidential and restricted. Go to Review Flowsheets activity to see all  data.    Improved, continue phentermine  Osteoarthritis of both knees Worsening pain and instability, refer Ortho  Back pain with radiculopathy Reports increased and uncontrolled pain requests pain management referral needs X ray of back  Left knee pain Increased pain and instability, ortho referral  Hyperlipidemia LDL goal <100 Hyperlipidemia:Low fat diet discussed and encouraged.   Lipid Panel  Lab Results  Component Value Date   CHOL 209 (H) 01/01/2020   HDL 48 01/01/2020   LDLCALC 140 (H) 01/01/2020   TRIG 119 01/01/2020   CHOLHDL 4.4 01/01/2020     Updated lab needed    IGT (impaired glucose tolerance) Patient educated about the importance of limiting  Carbohydrate intake , the need to commit to daily physical activity for a minimum of 30 minutes , and to commit weight loss. The fact that changes in all these areas will reduce or eliminate all together the development of diabetes is stressed. Updated lab needed at/ before next visit.    Diabetic Labs Latest Ref Rng & Units 08/19/2020 01/01/2020 03/08/2018 10/18/2017 03/07/2017  HbA1c 0.0 - 7.0 % 6.7 6.5 6.0(H) 5.9(H) -  Microalbumin Not estab mg/dL - - - - -  Micro/Creat Ratio <30 mcg/mg creat - - - - -  Chol 100 - 199 mg/dL - 05/07/2018) - 10/20/2017 -  HDL 05/05/2017 mg/dL - 48 - 60 -  Calc LDL 0 - 99 mg/dL - 621(H) - 086) -  Triglycerides 0 - 149 mg/dL - >57 - 87 -  Creatinine 0.57 - 1.00 mg/dL - 8.00 3.49 1.79 1.50(V)   BP/Weight 11/19/2020 08/19/2020 05/29/2020 03/20/2020 01/01/2020 11/26/2019 11/13/2019  Systolic BP 120 138 - - 120 134 134  Diastolic BP 80 88 - - 80 78 78  Wt. (Lbs) 214 240.12 242 247 247 254 254  BMI 36.73 41.22 42.2 43.07 43.07 44.29 44.29  Some encounter information is confidential and restricted. Go to Review Flowsheets activity to see all data.   Foot/eye exam completion dates 06/03/2015 04/16/2015  Foot Form Completion Done Done     Follow Up Instructions:    I discussed the assessment and treatment  plan with the patient. The patient was provided an opportunity to ask questions and all were answered. The patient agreed with the plan and demonstrated an understanding of the instructions.   The patient was advised to call back or seek an in-person evaluation if the symptoms worsen or if the condition fails to improve as anticipated.  I provided 14 minutes of non-face-to-face time during this encounter.   Syliva Overman, MD

## 2020-11-22 ENCOUNTER — Encounter: Payer: Self-pay | Admitting: Family Medicine

## 2020-11-22 NOTE — Assessment & Plan Note (Signed)
Worsening pain and instability, refer Ortho

## 2020-11-22 NOTE — Assessment & Plan Note (Signed)
Controlled, no change in medication  

## 2020-11-22 NOTE — Assessment & Plan Note (Signed)
Patient educated about the importance of limiting  Carbohydrate intake , the need to commit to daily physical activity for a minimum of 30 minutes , and to commit weight loss. The fact that changes in all these areas will reduce or eliminate all together the development of diabetes is stressed. Updated lab needed at/ before next visit.    Diabetic Labs Latest Ref Rng & Units 08/19/2020 01/01/2020 03/08/2018 10/18/2017 03/07/2017  HbA1c 0.0 - 7.0 % 6.7 6.5 6.0(H) 5.9(H) -  Microalbumin Not estab mg/dL - - - - -  Micro/Creat Ratio <30 mcg/mg creat - - - - -  Chol 100 - 199 mg/dL - 379(K) - 240 -  HDL >97 mg/dL - 48 - 60 -  Calc LDL 0 - 99 mg/dL - 353(G) - 992(E) -  Triglycerides 0 - 149 mg/dL - 268 - 87 -  Creatinine 0.57 - 1.00 mg/dL - 3.41 9.62 2.29 7.98(X)   BP/Weight 11/19/2020 08/19/2020 05/29/2020 03/20/2020 01/01/2020 11/26/2019 11/13/2019  Systolic BP 120 138 - - 120 134 134  Diastolic BP 80 88 - - 80 78 78  Wt. (Lbs) 214 240.12 242 247 247 254 254  BMI 36.73 41.22 42.2 43.07 43.07 44.29 44.29  Some encounter information is confidential and restricted. Go to Review Flowsheets activity to see all data.   Foot/eye exam completion dates 06/03/2015 04/16/2015  Foot Form Completion Done Done

## 2020-11-22 NOTE — Assessment & Plan Note (Signed)
Increased pain and instability, ortho referral

## 2020-11-22 NOTE — Assessment & Plan Note (Signed)
Reports increased and uncontrolled pain requests pain management referral needs X ray of back

## 2020-11-22 NOTE — Assessment & Plan Note (Signed)
  Patient re-educated about  the importance of commitment to a  minimum of 150 minutes of exercise per week as able.  The importance of healthy food choices with portion control discussed, as well as eating regularly and within a 12 hour window most days. The need to choose "clean , green" food 50 to 75% of the time is discussed, as well as to make water the primary drink and set a goal of 64 ounces water daily.    Weight /BMI 11/19/2020 08/19/2020 05/29/2020  WEIGHT 214 lb 240 lb 1.9 oz 242 lb  HEIGHT 5\' 4"  5\' 4"  5' 3.5"  BMI 36.73 kg/m2 41.22 kg/m2 42.2 kg/m2  Some encounter information is confidential and restricted. Go to Review Flowsheets activity to see all data.    Improved, continue phentermine

## 2020-11-22 NOTE — Assessment & Plan Note (Signed)
Hyperlipidemia:Low fat diet discussed and encouraged.   Lipid Panel  Lab Results  Component Value Date   CHOL 209 (H) 01/01/2020   HDL 48 01/01/2020   LDLCALC 140 (H) 01/01/2020   TRIG 119 01/01/2020   CHOLHDL 4.4 01/01/2020     Updated lab needed

## 2020-11-27 ENCOUNTER — Ambulatory Visit: Payer: Medicare HMO | Admitting: Orthopedic Surgery

## 2020-12-04 ENCOUNTER — Telehealth (INDEPENDENT_AMBULATORY_CARE_PROVIDER_SITE_OTHER): Payer: Medicare HMO | Admitting: Psychiatry

## 2020-12-04 ENCOUNTER — Encounter (HOSPITAL_COMMUNITY): Payer: Self-pay | Admitting: Psychiatry

## 2020-12-04 ENCOUNTER — Other Ambulatory Visit: Payer: Self-pay

## 2020-12-04 DIAGNOSIS — F3341 Major depressive disorder, recurrent, in partial remission: Secondary | ICD-10-CM | POA: Diagnosis not present

## 2020-12-04 DIAGNOSIS — F5102 Adjustment insomnia: Secondary | ICD-10-CM

## 2020-12-04 MED ORDER — LORAZEPAM 0.5 MG PO TABS: 0.5000 mg | ORAL_TABLET | Freq: Every day | ORAL | 2 refills | Status: DC | PRN

## 2020-12-04 MED ORDER — BUPROPION HCL ER (XL) 150 MG PO TB24: 150.0000 mg | ORAL_TABLET | ORAL | 1 refills | Status: DC

## 2020-12-04 MED ORDER — DULOXETINE HCL 60 MG PO CPEP: 60.0000 mg | ORAL_CAPSULE | Freq: Two times a day (BID) | ORAL | 2 refills | Status: DC

## 2020-12-04 MED ORDER — TRAZODONE HCL 50 MG PO TABS: 50.0000 mg | ORAL_TABLET | Freq: Every day | ORAL | 1 refills | Status: DC
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
## 2020-12-09 ENCOUNTER — Other Ambulatory Visit: Payer: Self-pay

## 2020-12-09 DIAGNOSIS — M549 Dorsalgia, unspecified: Secondary | ICD-10-CM

## 2020-12-09 DIAGNOSIS — E1169 Type 2 diabetes mellitus with other specified complication: Secondary | ICD-10-CM

## 2020-12-09 DIAGNOSIS — M170 Bilateral primary osteoarthritis of knee: Secondary | ICD-10-CM

## 2020-12-09 DIAGNOSIS — E669 Obesity, unspecified: Secondary | ICD-10-CM

## 2020-12-09 MED ORDER — GABAPENTIN 400 MG PO CAPS: ORAL_CAPSULE | ORAL | 1 refills | Status: DC

## 2020-12-15 ENCOUNTER — Other Ambulatory Visit: Payer: Self-pay

## 2020-12-15 ENCOUNTER — Encounter (INDEPENDENT_AMBULATORY_CARE_PROVIDER_SITE_OTHER): Payer: Self-pay

## 2020-12-15 ENCOUNTER — Ambulatory Visit (INDEPENDENT_AMBULATORY_CARE_PROVIDER_SITE_OTHER): Payer: Medicare HMO

## 2020-12-15 DIAGNOSIS — Z0000 Encounter for general adult medical examination without abnormal findings: Secondary | ICD-10-CM
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
## 2021-02-17 ENCOUNTER — Encounter: Payer: Medicare HMO | Admitting: Family Medicine

## 2021-02-26 ENCOUNTER — Encounter: Payer: Medicare HMO | Admitting: Family Medicine

## 2021-03-04 ENCOUNTER — Other Ambulatory Visit: Payer: Self-pay | Admitting: Internal Medicine

## 2021-03-04 DIAGNOSIS — I10 Essential (primary) hypertension: Secondary | ICD-10-CM

## 2021-03-06 ENCOUNTER — Telehealth (HOSPITAL_COMMUNITY): Payer: Medicare HMO | Admitting: Psychiatry

## 2021-03-10 ENCOUNTER — Encounter (HOSPITAL_COMMUNITY): Payer: Self-pay | Admitting: Psychiatry

## 2021-03-10 ENCOUNTER — Other Ambulatory Visit: Payer: Self-pay

## 2021-03-10 ENCOUNTER — Telehealth (INDEPENDENT_AMBULATORY_CARE_PROVIDER_SITE_OTHER): Payer: Medicare HMO | Admitting: Psychiatry

## 2021-03-10 DIAGNOSIS — F5102 Adjustment insomnia: Secondary | ICD-10-CM | POA: Diagnosis not present

## 2021-03-10 DIAGNOSIS — F3341 Major depressive disorder, recurrent, in partial remission: Secondary | ICD-10-CM

## 2021-03-10 MED ORDER — BUPROPION HCL ER (XL) 150 MG PO TB24: 150.0000 mg | ORAL_TABLET | ORAL | 1 refills | Status: DC

## 2021-03-10 MED ORDER — DULOXETINE HCL 60 MG PO CPEP: 60.0000 mg | ORAL_CAPSULE | Freq: Two times a day (BID) | ORAL | 2 refills | Status: DC

## 2021-03-10 MED ORDER — TRAZODONE HCL 50 MG PO TABS: 50.0000 mg | ORAL_TABLET | Freq: Every day | ORAL | 1 refills | Status: DC

## 2021-03-10 MED ORDER — LORAZEPAM 0.5 MG PO TABS: 0.5000 mg | ORAL_TABLET | Freq: Every day | ORAL | 3 refills | Status: DC | PRN
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
## 2021-03-27 ENCOUNTER — Encounter: Payer: Medicare HMO | Admitting: Family Medicine

## 2021-04-09 ENCOUNTER — Encounter: Payer: Medicare HMO | Admitting: Family Medicine

## 2021-05-05 ENCOUNTER — Encounter: Payer: Medicare HMO | Admitting: Family Medicine

## 2021-05-06 ENCOUNTER — Ambulatory Visit (INDEPENDENT_AMBULATORY_CARE_PROVIDER_SITE_OTHER): Payer: Medicare HMO | Admitting: Family Medicine

## 2021-05-06 ENCOUNTER — Encounter: Payer: Self-pay | Admitting: Family Medicine

## 2021-05-06 VITALS — BP 120/80

## 2021-05-06 DIAGNOSIS — I10 Essential (primary) hypertension: Secondary | ICD-10-CM

## 2021-05-06 DIAGNOSIS — F3341 Major depressive disorder, recurrent, in partial remission: Secondary | ICD-10-CM | POA: Diagnosis not present

## 2021-05-06 MED ORDER — SPIRONOLACTONE 25 MG PO TABS: 25.0000 mg | ORAL_TABLET | Freq: Every day | ORAL | 0 refills | Status: DC

## 2021-05-06 NOTE — Progress Notes (Signed)
Virtual Visit via Telephone Note ? ?I connected with Tami Pearson on 05/06/21 at  1:40 PM EDT by telephone and verified that I am speaking with the correct person using two identifiers. ? ?Location: ?Patient: driving  / passenger  ?Provider: office ?  ?I discussed the limitations, risks, security and privacy concerns of performing an evaluation and management service by telephone and the availability of in person appointments. I also discussed with the patient that there may be a patient responsible charge related to this service. The patient expressed understanding and agreed to proceed. ? ? ?History of Present Illness: ?Phone visit to follow up chronic problenms. No new complaints. Frustrated with weight but blood pressure needs to be controlled before she can resume phentermine ?  ?Observations/Objective: ?BP 120/80   LMP 03/18/2013  ?Good communication with no confusion and intact memory. ?Alert and oriented x 3 ?No signs of respiratory distress during speech ? ? ?Assessment and Plan: ? ? ?Follow Up Instructions: ? ?  ?I discussed the assessment and treatment plan with the patient. The patient was provided an opportunity to ask questions and all were answered. The patient agreed with the plan and demonstrated an understanding of the instructions. ?  ?The patient was advised to call back or seek an in-person evaluation if the symptoms worsen or if the condition fails to improve as anticipated. ? ?I provided 87 minutes of non-face-to-face time during this encounter. ? ? ?Tula Nakayama, MD ? ?

## 2021-05-06 NOTE — Patient Instructions (Signed)
Annual exam in 3 to 6 weeks in office, call if you need me before ? ?Fasting cBC, lipid, cmp and eGFr, HBA1C , TSH, Vit D same day as appt , has transport issues ? ?Please schedule July mammogram at [facility] center at checkout ? ?I DO recommend shingrix vaccines, and they are available at your pharmacy ? ?It is important that you exercise regularly at least 30 minutes 5 times a week. If you develop chest pain, have severe difficulty breathing, or feel very tired, stop exercising immediately and seek medical attention  ? ?Think about what you will eat, plan ahead. ?Choose " clean, green, fresh or frozen" over canned, processed or packaged foods which are more sugary, salty and fatty. ?70 to 75% of food eaten should be vegetables and fruit. ?Three meals at set times with snacks allowed between meals, but they must be fruit or vegetables. ?Aim to eat over a 12 hour period , example 7 am to 7 pm, and STOP after  your last meal of the day. ?Drink water,generally about 64 ounces per day, no other drink is as healthy. Fruit juice is best enjoyed in a healthy way, by EATING the fruit. ?Thanks for choosing [facility], we consider it a privelige to serve you. ? ? ? ? ? ?

## 2021-05-10 ENCOUNTER — Encounter: Payer: Self-pay | Admitting: Family Medicine

## 2021-05-10 NOTE — Assessment & Plan Note (Signed)
Mnaaged by Psych and controlled on current meds ?

## 2021-05-10 NOTE — Assessment & Plan Note (Signed)
DASH diet and commitment to daily physical activity for a minimum of 30 minutes discussed and encouraged, as a part of hypertension management. ?The importance of attaining a healthy weight is also discussed. ? ? ?  05/06/2021  ? 11:49 AM 11/19/2020  ?  1:36 PM 08/19/2020  ?  2:38 PM 05/29/2020  ?  2:27 PM 03/20/2020  ?  1:14 PM 01/01/2020  ?  1:38 PM 01/01/2020  ?  1:19 PM  ?BP/Weight  ?Systolic BP 120 120 138   120 151  ?Diastolic BP 80 80 88   80 96  ?Wt. (Lbs)  214 240.12 242 247 247 274.04  ?BMI  36.73 kg/m2 41.22 kg/m2 42.2 kg/m2 43.07 kg/m2 43.07 kg/m2 47.78 kg/m2  ? ? ? ?Needs in office assessment ?

## 2021-05-10 NOTE — Assessment & Plan Note (Signed)
?  Patient re-educated about  the importance of commitment to a  minimum of 150 minutes of exercise per week as able. ? ?The importance of healthy food choices with portion control discussed, as well as eating regularly and within a 12 hour window most days. ?The need to choose "clean , green" food 50 to 75% of the time is discussed, as well as to make water the primary drink and set a goal of 64 ounces water daily. ? ?  ? ?  11/19/2020  ?  1:36 PM 08/19/2020  ?  2:38 PM 05/29/2020  ?  2:27 PM  ?Weight /BMI  ?Weight 214 lb 240 lb 1.9 oz 242 lb  ?Height 5\' 4"  (1.626 m) 5\' 4"  (1.626 m) 5' 3.5" (1.613 m)  ?BMI 36.73 kg/m2 41.22 kg/m2 42.2 kg/m2  ? ? ?Needs in office re eval ?

## 2021-06-08 ENCOUNTER — Telehealth: Payer: Self-pay | Admitting: Family Medicine

## 2021-06-08 NOTE — Telephone Encounter (Signed)
Patient called in regard to [facility]. ?Patient states that [facility] will cover med 100% and patient wants to go with the med.  ?

## 2021-06-10 NOTE — Telephone Encounter (Signed)
Patient aware.

## 2021-06-24 ENCOUNTER — Encounter: Payer: Medicare HMO | Admitting: Family Medicine

## 2021-06-30 ENCOUNTER — Ambulatory Visit (INDEPENDENT_AMBULATORY_CARE_PROVIDER_SITE_OTHER): Payer: Medicare HMO | Admitting: Family Medicine

## 2021-06-30 DIAGNOSIS — U071 COVID-19: Secondary | ICD-10-CM | POA: Diagnosis not present

## 2021-06-30 MED ORDER — NIRMATRELVIR/RITONAVIR (PAXLOVID)TABLET: 3.0000 | ORAL_TABLET | Freq: Two times a day (BID) | ORAL | 0 refills | Status: AC

## 2021-06-30 NOTE — Patient Instructions (Addendum)
Please keep July appointment, call if you need me sooner  Paxlovid is prescribed to treat your infection, take entire course as prescribed , start today  Use tylenol for fever and body aches  Drink a lot of water and get sufficient rest  Go to the nearest hospital if you develop dificulty breathjing , or high fever that will not break  Thanks for choosing [facility], we consider it a privelige to serve you.

## 2021-06-30 NOTE — Progress Notes (Unsigned)
Virtual Visit via Telephone Note  I connected with Tami Pearson on 06/30/21 at 11:40 AM EDT by telephone and verified that I am speaking with the correct person using two identifiers.  Location: Patient: HOME Provider: OFFICE   I discussed the limitations, risks, security and privacy concerns of performing an evaluation and management service by telephone and the availability of in person appointments. I also discussed with the patient that there may be a patient responsible charge related to this service. The patient expressed understanding and agreed to proceed.   History of Present Illness:   Positive covid home test 5/29, symptoms started on 5/27 daughter that  she lives with positive on 06/27/2021 Was hospitalized in 2021 for 2 weeks, with covid, never has taken any vaccine Denies shortness of breath C/o extreme fatigue   Observations/Objective:  LMP 03/18/2013   Good communication with no confusion and intact memory. Alert and oriented x 3. Sounds ill No signs of respiratory distress during speech  Assessment and Plan: COVID-19 Ppaxlovid prescribed Educated re nedd to complete course of medication. Go to ED for symptoms of respiratory decompensation or overall deterioration Importance of rest and adequate hydration discussed    Follow Up Instructions:    I discussed the assessment and treatment plan with the patient. The patient was provided an opportunity to ask questions and all were answered. The patient agreed with the plan and demonstrated an understanding of the instructions.   The patient was advised to call back or seek an in-person evaluation if the symptoms worsen or if the condition fails to improve as anticipated.  I provided 12 minutes of non-face-to-face time during this encounter.   Syliva Overman, MD

## 2021-07-01 ENCOUNTER — Encounter: Payer: Medicare HMO | Admitting: Family Medicine

## 2021-07-01 ENCOUNTER — Encounter: Payer: Self-pay | Admitting: Family Medicine

## 2021-07-01 NOTE — Assessment & Plan Note (Signed)
Ppaxlovid prescribed Educated re nedd to complete course of medication. Go to ED for symptoms of respiratory decompensation or overall deterioration Importance of rest and adequate hydration discussed

## 2021-07-08 ENCOUNTER — Encounter (HOSPITAL_COMMUNITY): Payer: Self-pay | Admitting: Psychiatry

## 2021-07-08 ENCOUNTER — Telehealth (INDEPENDENT_AMBULATORY_CARE_PROVIDER_SITE_OTHER): Payer: Medicare HMO | Admitting: Psychiatry

## 2021-07-08 DIAGNOSIS — F3341 Major depressive disorder, recurrent, in partial remission: Secondary | ICD-10-CM | POA: Diagnosis not present

## 2021-07-08 DIAGNOSIS — F5102 Adjustment insomnia: Secondary | ICD-10-CM

## 2021-07-08 MED ORDER — LORAZEPAM 0.5 MG PO TABS: 0.5000 mg | ORAL_TABLET | Freq: Every day | ORAL | 3 refills | Status: DC | PRN

## 2021-07-08 MED ORDER — DULOXETINE HCL 60 MG PO CPEP: 60.0000 mg | ORAL_CAPSULE | Freq: Two times a day (BID) | ORAL | 2 refills | Status: DC

## 2021-07-08 MED ORDER — TRAZODONE HCL 50 MG PO TABS: 50.0000 mg | ORAL_TABLET | Freq: Every day | ORAL | 1 refills | Status: DC

## 2021-07-08 MED ORDER — BUPROPION HCL ER (XL) 150 MG PO TB24: 150.0000 mg | ORAL_TABLET | ORAL | 1 refills | Status: DC
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
## 2021-08-06 ENCOUNTER — Encounter: Payer: Self-pay | Admitting: Family Medicine

## 2021-08-06 ENCOUNTER — Ambulatory Visit (INDEPENDENT_AMBULATORY_CARE_PROVIDER_SITE_OTHER): Payer: Medicare HMO | Admitting: Family Medicine

## 2021-08-06 VITALS — BP 150/90 | HR 77 | Ht 63.5 in | Wt 253.1 lb

## 2021-08-06 DIAGNOSIS — E559 Vitamin D deficiency, unspecified: Secondary | ICD-10-CM | POA: Diagnosis not present

## 2021-08-06 DIAGNOSIS — Z0000 Encounter for general adult medical examination without abnormal findings: Secondary | ICD-10-CM

## 2021-08-06 DIAGNOSIS — R7302 Impaired glucose tolerance (oral): Secondary | ICD-10-CM | POA: Diagnosis not present

## 2021-08-06 DIAGNOSIS — E785 Hyperlipidemia, unspecified: Secondary | ICD-10-CM

## 2021-08-06 DIAGNOSIS — E1169 Type 2 diabetes mellitus with other specified complication: Secondary | ICD-10-CM

## 2021-08-06 DIAGNOSIS — I10 Essential (primary) hypertension: Secondary | ICD-10-CM

## 2021-08-06 DIAGNOSIS — H547 Unspecified visual loss: Secondary | ICD-10-CM | POA: Insufficient documentation

## 2021-08-06 MED ORDER — AMLODIPINE BESYLATE 5 MG PO TABS: 5.0000 mg | ORAL_TABLET | Freq: Every day | ORAL | 0 refills | Status: DC

## 2021-08-06 NOTE — Assessment & Plan Note (Signed)

## 2021-08-06 NOTE — Assessment & Plan Note (Signed)
Long h/o vision impairment, refer eye exam

## 2021-08-06 NOTE — Patient Instructions (Signed)
F/u in 4 weeks, re evaluate blood pressure, call if you need me sooner  New additional medication for blood pressure is amlodipine 5 mg one  daily ( sent to mail order) Continue what you are currently taking  Labs today, cBC, lipid, cmp and eGFR, hBA1c, Tsh AND VIT d  WILL DECIDE ON NEW MED FOR WEIGHT LOSS BASED ON LABS  Please get your shingrix vaccines at your pharmacy  You are referred for eye exam, need help with glasses  Thanks for choosing [facility], we consider it a privelige to serve you.

## 2021-08-07 LAB — VITAMIN D 25 HYDROXY (VIT D DEFICIENCY, FRACTURES): Vit D, 25-Hydroxy: 28.2 ng/mL — ABNORMAL LOW (ref 30.0–100.0)

## 2021-08-07 LAB — CBC
Hematocrit: 45.2 % (ref 34.0–46.6)
Hemoglobin: 14.9 g/dL (ref 11.1–15.9)
MCH: 28.2 pg (ref 26.6–33.0)
MCHC: 33 g/dL (ref 31.5–35.7)
MCV: 86 fL (ref 79–97)
Platelets: 383 10*3/uL (ref 150–450)
RBC: 5.28 x10E6/uL (ref 3.77–5.28)
RDW: 14.3 % (ref 11.7–15.4)
WBC: 6.9 10*3/uL (ref 3.4–10.8)

## 2021-08-07 LAB — LIPID PANEL
Chol/HDL Ratio: 4.1 ratio (ref 0.0–4.4)
Cholesterol, Total: 243 mg/dL — ABNORMAL HIGH (ref 100–199)
HDL: 60 mg/dL (ref >39)
LDL Chol Calc (NIH): 166 mg/dL — ABNORMAL HIGH (ref 0–99)
Triglycerides: 97 mg/dL (ref 0–149)
VLDL Cholesterol Cal: 17 mg/dL (ref 5–40)

## 2021-08-07 LAB — HEMOGLOBIN A1C
Est. average glucose Bld gHb Est-mCnc: 160 mg/dL
Hgb A1c MFr Bld: 7.2 % — ABNORMAL HIGH (ref 4.8–5.6)

## 2021-08-07 LAB — CMP14+EGFR
ALT: 13 IU/L (ref 0–32)
AST: 16 IU/L (ref 0–40)
Albumin/Globulin Ratio: 1 — ABNORMAL LOW (ref 1.2–2.2)
Albumin: 4.6 g/dL (ref 3.8–4.9)
Alkaline Phosphatase: 85 IU/L (ref 44–121)
BUN/Creatinine Ratio: 25 — ABNORMAL HIGH (ref 9–23)
BUN: 19 mg/dL (ref 6–24)
Bilirubin Total: 0.3 mg/dL (ref 0.0–1.2)
CO2: 17 mmol/L — ABNORMAL LOW (ref 20–29)
Calcium: 9.6 mg/dL (ref 8.7–10.2)
Chloride: 103 mmol/L (ref 96–106)
Creatinine, Ser: 0.75 mg/dL (ref 0.57–1.00)
Globulin, Total: 4.5 g/dL (ref 1.5–4.5)
Glucose: 123 mg/dL — ABNORMAL HIGH (ref 70–99)
Potassium: 4.2 mmol/L (ref 3.5–5.2)
Sodium: 142 mmol/L (ref 134–144)
Total Protein: 9.1 g/dL — ABNORMAL HIGH (ref 6.0–8.5)
eGFR: 93 mL/min/{1.73_m2} (ref >59)

## 2021-08-07 LAB — TSH: TSH: 1.74 u[IU]/mL (ref 0.450–4.500)

## 2021-08-08 ENCOUNTER — Encounter: Payer: Self-pay | Admitting: Family Medicine

## 2021-08-08 MED ORDER — OZEMPIC (0.25 OR 0.5 MG/DOSE) 2 MG/3ML ~~LOC~~ SOPN: 0.5000 mg | PEN_INJECTOR | SUBCUTANEOUS | 0 refills | Status: DC

## 2021-08-08 MED ORDER — ROSUVASTATIN CALCIUM 20 MG PO TABS: 20.0000 mg | ORAL_TABLET | Freq: Every day | ORAL | 1 refills | Status: DC

## 2021-08-08 MED ORDER — SEMAGLUTIDE(0.25 OR 0.5MG/DOS) 2 MG/3ML ~~LOC~~ SOPN: 0.2500 mg | PEN_INJECTOR | SUBCUTANEOUS | 0 refills | Status: DC

## 2021-08-08 NOTE — Assessment & Plan Note (Signed)
Uncontrolled add amlodipine 5 mg  DASH diet and commitment to daily physical activity for a minimum of 30 minutes discussed and encouraged, as a part of hypertension management. The importance of attaining a healthy weight is also discussed.     08/06/2021   10:48 AM 08/06/2021   10:01 AM 05/06/2021   11:49 AM 11/19/2020    1:36 PM 08/19/2020    2:38 PM 05/29/2020    2:27 PM 03/20/2020    1:14 PM  BP/Weight  Systolic BP 150 130 120 120 138    Diastolic BP 90 82 80 80 88    Wt. (Lbs)  253.08  214 240.12 242 247  BMI  44.13 kg/m2  36.73 kg/m2 41.22 kg/m2 42.2 kg/m2 43.07 kg/m2

## 2021-08-08 NOTE — Assessment & Plan Note (Signed)
Hyperlipidemia:Low fat diet discussed and encouraged.   Lipid Panel  Lab Results  Component Value Date   CHOL 243 (H) 08/06/2021   HDL 60 08/06/2021   LDLCALC 166 (H) 08/06/2021   TRIG 97 08/06/2021   CHOLHDL 4.1 08/06/2021     Start crestor 20 mg

## 2021-08-08 NOTE — Progress Notes (Signed)
    Tami Pearson     MRN: [id number]      DOB: 1965/07/22  HPI: Patient is in for annual physical exam. Uncontrolled blood pressure, hyperlipidemia and diabetes are addressed at visit Immunization is reviewed , and  updated if needed.   PE: BP (!) 150/90   Pulse 77   Ht 5' 3.5" (1.613 m)   Wt 253 lb 1.3 oz (114.8 kg)   LMP 03/18/2013   SpO2 95%   BMI 44.13 kg/m   Pleasant  female, alert and oriented x 3, in no cardio-pulmonary distress. Afebrile. HEENT No facial trauma or asymetry. Sinuses non tender.  Extra occullar muscles intact.. External ears normal, . Neck: supple, no adenopathy,JVD or thyromegaly.No bruits.  Chest: Clear to ascultation bilaterally.No crackles or wheezes. Non tender to palpation  Breast: Not examined  Cardiovascular system; Heart sounds normal,  S1 and  S2 ,no S3.  No murmur, or thrill. Apical beat not displaced Peripheral pulses normal.  Abdomen: Soft, non tender, .    Musculoskeletal exam: Decreased  ROM of spine, hips , shoulders and knees. No deformity ,swelling or crepitus noted. No muscle wasting or atrophy.   Neurologic: Cranial nerves 2 to 12 intact. Power, tone ,sensation and reflexes normal throughout. No disturbance in gait. No tremor.  Skin: Intact, no ulceration, erythema , scaling or rash noted. Pigmentation normal throughout  Psych; Normal mood and affect. Judgement and concentration normal   Assessment & Plan:  Encounter for annual physical exam Annual exam as documented. Counseling done  re healthy lifestyle involving commitment to 150 minutes exercise per week, heart healthy diet, and attaining healthy weight.The importance of adequate sleep also discussed. Regular seat belt use and home safety, is also discussed. Changes in health habits are decided on by the patient with goals and time frames  set for achieving them. Immunization and cancer screening needs are specifically addressed at this  visit.   Poor vision Long h/o vision impairment, refer eye exam  Essential hypertension Uncontrolled add amlodipine 5 mg  DASH diet and commitment to daily physical activity for a minimum of 30 minutes discussed and encouraged, as a part of hypertension management. The importance of attaining a healthy weight is also discussed.     08/06/2021   10:48 AM 08/06/2021   10:01 AM 05/06/2021   11:49 AM 11/19/2020    1:36 PM 08/19/2020    2:38 PM 05/29/2020    2:27 PM 03/20/2020    1:14 PM  BP/Weight  Systolic BP 150 130 120 120 138    Diastolic BP 90 82 80 80 88    Wt. (Lbs)  253.08  214 240.12 242 247  BMI  44.13 kg/m2  36.73 kg/m2 41.22 kg/m2 42.2 kg/m2 43.07 kg/m2       Hyperlipidemia LDL goal <100 Hyperlipidemia:Low fat diet discussed and encouraged.   Lipid Panel  Lab Results  Component Value Date   CHOL 243 (H) 08/06/2021   HDL 60 08/06/2021   LDLCALC 166 (H) 08/06/2021   TRIG 97 08/06/2021   CHOLHDL 4.1 08/06/2021     Start crestor 20 mg

## 2021-08-14 ENCOUNTER — Other Ambulatory Visit: Payer: Self-pay

## 2021-08-14 MED ORDER — OZEMPIC (0.25 OR 0.5 MG/DOSE) 2 MG/3ML ~~LOC~~ SOPN: PEN_INJECTOR | SUBCUTANEOUS | 1 refills | Status: DC

## 2021-08-17 ENCOUNTER — Telehealth: Payer: Self-pay

## 2021-08-17 ENCOUNTER — Other Ambulatory Visit: Payer: Self-pay | Admitting: Family Medicine

## 2021-08-17 ENCOUNTER — Telehealth: Payer: Self-pay | Admitting: Family Medicine

## 2021-08-17 NOTE — Telephone Encounter (Signed)
See previous tele msg 

## 2021-08-17 NOTE — Telephone Encounter (Signed)
Channing Mutters with [employer] phar called in regards to needing clarification on the Semaglutide,0.25 or 0.5MG /DOS, (OZEMPIC, 0.25 OR 0.5 MG/DOSE,) 2 MG/3ML SOPN.    Call back [phone]

## 2021-08-17 NOTE — Telephone Encounter (Signed)
Patient called said the pharmacy said needs another rx for ozempic.    Semaglutide,0.25 or 0.5MG /DOS, (OZEMPIC, 0.25 OR 0.5 MG/DOSE,) 2 MG/3ML [facility]  [facility] - [place], [place] - [address]  [address] [place] [address]  Phone:  [phone]  Fax:  [phone]

## 2021-08-17 NOTE — Progress Notes (Unsigned)
Correct doses already ordered

## 2021-08-24 ENCOUNTER — Encounter: Payer: Self-pay | Admitting: Family Medicine

## 2021-08-24 DIAGNOSIS — Z1231 Encounter for screening mammogram for malignant neoplasm of breast: Secondary | ICD-10-CM | POA: Diagnosis not present

## 2021-09-10 ENCOUNTER — Ambulatory Visit (INDEPENDENT_AMBULATORY_CARE_PROVIDER_SITE_OTHER): Payer: Medicare HMO | Admitting: Family Medicine

## 2021-09-10 ENCOUNTER — Encounter: Payer: Self-pay | Admitting: Family Medicine

## 2021-09-10 VITALS — BP 119/74

## 2021-09-10 DIAGNOSIS — I10 Essential (primary) hypertension: Secondary | ICD-10-CM | POA: Diagnosis not present

## 2021-09-10 NOTE — Progress Notes (Signed)
Virtual Visit via telephone Note  I connected with Tami Pearson on 09/10/21 at 10:40 AM EDT by a video enabled telemedicine application and verified that I am speaking with the correct person using two identifiers.  Location: Patient:home Provider: office   I discussed the limitations of evaluation and management by telemedicine and the availability of in person appointments. The patient expressed understanding and agreed to proceed.  History of Present Illness: Follow u[p visit foir re evaluation of blood pressure. Pt has opted top have visit virtually, which is NOT at all ideal or my preference, states she has to babaysit her grandkids and thought OK as she takes her bP at home regularly. Understands need for in peraon visit next appt Reports no adverse s/e from meds, and excellent BP Feels well   Observations/Objective: BP 119/74   LMP 03/18/2013    Assessment and Plan:  Essential hypertension DASH diet and commitment to daily physical activity for a minimum of 30 minutes discussed and encouraged, as a part of hypertension management. The importance of attaining a healthy weight is also discussed.     09/10/2021   10:39 AM 08/06/2021   10:48 AM 08/06/2021   10:01 AM 05/06/2021   11:49 AM 11/19/2020    1:36 PM 08/19/2020    2:38 PM 05/29/2020    2:27 PM  BP/Weight  Systolic BP 119 150 130 120 120 138   Diastolic BP 74 90 82 80 80 88   Wt. (Lbs)   253.08  214 240.12 242  BMI   44.13 kg/m2  36.73 kg/m2 41.22 kg/m2 42.2 kg/m2     Controlled on current meds per pt report, needs in office follow up  Morbid obesity  Patient re-educated about  the importance of commitment to a  minimum of 150 minutes of exercise per week as able.  The importance of healthy food choices with portion control discussed, as well as eating regularly and within a 12 hour window most days. The need to choose "clean , green" food 50 to 75% of the time is discussed, as well as to make water the  primary drink and set a goal of 64 ounces water daily.       08/06/2021   10:01 AM 11/19/2020    1:36 PM 08/19/2020    2:38 PM  Weight /BMI  Weight 253 lb 1.3 oz 214 lb 240 lb 1.9 oz  Height 5' 3.5" (1.613 m) 5\' 4"  (1.626 m) 5\' 4"  (1.626 m)  BMI 44.13 kg/m2 36.73 kg/m2 41.22 kg/m2      Follow Up Instructions:    I discussed the assessment and treatment plan with the patient. The patient was provided an opportunity to ask questions and all were answered. The patient agreed with the plan and demonstrated an understanding of the instructions.   The patient was advised to call back or seek an in-person evaluation if the symptoms worsen or if the condition fails to improve as anticipated.  I provided 7 minutes of non-face-to-face time during this encounter.   , MD

## 2021-09-10 NOTE — Assessment & Plan Note (Signed)
DASH diet and commitment to daily physical activity for a minimum of 30 minutes discussed and encouraged, as a part of hypertension management. The importance of attaining a healthy weight is also discussed.     09/10/2021   10:39 AM 08/06/2021   10:48 AM 08/06/2021   10:01 AM 05/06/2021   11:49 AM 11/19/2020    1:36 PM 08/19/2020    2:38 PM 05/29/2020    2:27 PM  BP/Weight  Systolic BP 119 150 130 120 120 138   Diastolic BP 74 90 82 80 80 88   Wt. (Lbs)   253.08  214 240.12 242  BMI   44.13 kg/m2  36.73 kg/m2 41.22 kg/m2 42.2 kg/m2     Controlled on current meds per pt report, needs in office follow up

## 2021-09-10 NOTE — Patient Instructions (Signed)
F/U in office in 9 weeks, call if you need  me sooner  Continue current meds  It is important that you exercise regularly at least 30 minutes 5 times a week. If you develop chest pain, have severe difficulty breathing, or feel very tired, stop exercising immediately and seek medical attention   Think about what you will eat, plan ahead. Choose " clean, green, fresh or frozen" over canned, processed or packaged foods which are more sugary, salty and fatty. 70 to 75% of food eaten should be vegetables and fruit. Three meals at set times with snacks allowed between meals, but they must be fruit or vegetables. Aim to eat over a 12 hour period , example 7 am to 7 pm, and STOP after  your last meal of the day. Drink water,generally about 64 ounces per day, no other drink is as healthy. Fruit juice is best enjoyed in a healthy way, by EATING the fruit. Thanks for choosing [facility], we consider it a privelige to serve you.

## 2021-09-10 NOTE — Assessment & Plan Note (Signed)
  Patient re-educated about  the importance of commitment to a  minimum of 150 minutes of exercise per week as able.  The importance of healthy food choices with portion control discussed, as well as eating regularly and within a 12 hour window most days. The need to choose "clean , green" food 50 to 75% of the time is discussed, as well as to make water the primary drink and set a goal of 64 ounces water daily.       08/06/2021   10:01 AM 11/19/2020    1:36 PM 08/19/2020    2:38 PM  Weight /BMI  Weight 253 lb 1.3 oz 214 lb 240 lb 1.9 oz  Height 5' 3.5" (1.613 m) 5\' 4"  (1.626 m) 5\' 4"  (1.626 m)  BMI 44.13 kg/m2 36.73 kg/m2 41.22 kg/m2

## 2021-10-07 ENCOUNTER — Encounter (HOSPITAL_COMMUNITY): Payer: Self-pay | Admitting: Psychiatry

## 2021-10-07 ENCOUNTER — Telehealth (INDEPENDENT_AMBULATORY_CARE_PROVIDER_SITE_OTHER): Payer: Medicare HMO | Admitting: Psychiatry

## 2021-10-07 ENCOUNTER — Other Ambulatory Visit: Payer: Self-pay | Admitting: Internal Medicine

## 2021-10-07 ENCOUNTER — Other Ambulatory Visit: Payer: Self-pay

## 2021-10-07 DIAGNOSIS — F3341 Major depressive disorder, recurrent, in partial remission: Secondary | ICD-10-CM | POA: Diagnosis not present

## 2021-10-07 DIAGNOSIS — I10 Essential (primary) hypertension: Secondary | ICD-10-CM

## 2021-10-07 DIAGNOSIS — F5102 Adjustment insomnia: Secondary | ICD-10-CM

## 2021-10-07 DIAGNOSIS — F418 Other specified anxiety disorders: Secondary | ICD-10-CM | POA: Diagnosis not present

## 2021-10-07 MED ORDER — LORAZEPAM 0.5 MG PO TABS: 0.5000 mg | ORAL_TABLET | Freq: Every day | ORAL | 3 refills | Status: DC | PRN

## 2021-10-07 MED ORDER — TRAZODONE HCL 50 MG PO TABS: 50.0000 mg | ORAL_TABLET | Freq: Every day | ORAL | 1 refills | Status: DC

## 2021-10-07 MED ORDER — SPIRONOLACTONE-HCTZ 25-25 MG PO TABS: 1.0000 | ORAL_TABLET | Freq: Every day | ORAL | 1 refills | Status: DC

## 2021-10-07 MED ORDER — BUPROPION HCL ER (XL) 150 MG PO TB24: 150.0000 mg | ORAL_TABLET | ORAL | 1 refills | Status: DC

## 2021-10-07 MED ORDER — DULOXETINE HCL 60 MG PO CPEP: 60.0000 mg | ORAL_CAPSULE | Freq: Two times a day (BID) | ORAL | 2 refills | Status: DC
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
## 2021-10-16 ENCOUNTER — Other Ambulatory Visit: Payer: Self-pay | Admitting: Family Medicine

## 2021-10-19 ENCOUNTER — Other Ambulatory Visit: Payer: Self-pay | Admitting: Family Medicine

## 2021-10-27 ENCOUNTER — Telehealth: Payer: Self-pay

## 2021-10-27 ENCOUNTER — Other Ambulatory Visit: Payer: Self-pay

## 2021-10-27 MED ORDER — OZEMPIC (0.25 OR 0.5 MG/DOSE) 2 MG/3ML ~~LOC~~ SOPN
PEN_INJECTOR | SUBCUTANEOUS | 0 refills | Status: DC
Start: 2021-10-27 — End: 2021-11-24

## 2021-10-27 NOTE — Telephone Encounter (Signed)
No prior auth forms received from pharmacy and nothing in cover my meds will hold until paperwork is received.

## 2021-10-27 NOTE — Telephone Encounter (Signed)
Patient called need med refill pharmacy not received yet, pharmacy has faxed over all papers for prior authorization. Only has 2 shots left.  OZEMPIC, 0.25 OR 0.5 MG/DOSE, 2 MG/3ML SOPN  Pharmacy:  [facility] - [place], [address]  [place], [place] [address]  Phone:  [phone]  Fax:  [phone]

## 2021-11-13 ENCOUNTER — Ambulatory Visit: Payer: Medicare HMO | Admitting: Family Medicine

## 2021-11-20 ENCOUNTER — Ambulatory Visit (INDEPENDENT_AMBULATORY_CARE_PROVIDER_SITE_OTHER): Payer: Medicare HMO | Admitting: Family Medicine

## 2021-11-20 ENCOUNTER — Encounter: Payer: Self-pay | Admitting: Family Medicine

## 2021-11-20 VITALS — BP 158/100 | HR 74 | Ht 63.0 in | Wt 244.0 lb

## 2021-11-20 DIAGNOSIS — Z23 Encounter for immunization: Secondary | ICD-10-CM | POA: Diagnosis not present

## 2021-11-20 DIAGNOSIS — E1169 Type 2 diabetes mellitus with other specified complication: Secondary | ICD-10-CM | POA: Diagnosis not present

## 2021-11-20 DIAGNOSIS — E785 Hyperlipidemia, unspecified: Secondary | ICD-10-CM | POA: Diagnosis not present

## 2021-11-20 DIAGNOSIS — I10 Essential (primary) hypertension: Secondary | ICD-10-CM | POA: Diagnosis not present

## 2021-11-20 MED ORDER — AMLODIPINE BESYLATE 10 MG PO TABS: 10.0000 mg | ORAL_TABLET | Freq: Every day | ORAL | 1 refills | Status: DC

## 2021-11-20 MED ORDER — LANCETS 30G MISC: 5 refills | Status: AC

## 2021-11-20 MED ORDER — BLOOD GLUCOSE METER KIT: PACK | 0 refills | Status: AC

## 2021-11-20 MED ORDER — GLUCOSE BLOOD VI STRP: ORAL_STRIP | 12 refills | Status: AC

## 2021-11-20 NOTE — Assessment & Plan Note (Signed)
Improved  Patient re-educated about  the importance of commitment to a  minimum of 150 minutes of exercise per week as able.  The importance of healthy food choices with portion control discussed, as well as eating regularly and within a 12 hour window most days. The need to choose "clean , green" food 50 to 75% of the time is discussed, as well as to make water the primary drink and set a goal of 64 ounces water daily.       11/20/2021    9:22 AM 08/06/2021   10:01 AM 11/19/2020    1:36 PM  Weight /BMI  Weight 244 lb 253 lb 1.3 oz 214 lb  Height 5\' 3"  (1.6 m) 5' 3.5" (1.613 m) 5\' 4"  (1.626 m)  BMI 43.22 kg/m2 44.13 kg/m2 36.73 kg/m2

## 2021-11-20 NOTE — Assessment & Plan Note (Signed)
Uncontrolled , increase amlodipine to 10 mg daily DASH diet and commitment to daily physical activity for a minimum of 30 minutes discussed and encouraged, as a part of hypertension management. The importance of attaining a healthy weight is also discussed.     11/20/2021    9:43 AM 11/20/2021    9:22 AM 09/10/2021   10:39 AM 08/06/2021   10:48 AM 08/06/2021   10:01 AM 05/06/2021   11:49 AM 11/19/2020    1:36 PM  BP/Weight  Systolic BP 106 269 485 462 703 500 938  Diastolic BP 182 91 74 90 82 80 80  Wt. (Lbs)  244   253.08  214  BMI  43.22 kg/m2   44.13 kg/m2  36.73 kg/m2

## 2021-11-20 NOTE — Progress Notes (Signed)
Tami Pearson     MRN: [id number]      DOB: 03-01-1965   HPI Tami Pearson is here for follow up and re-evaluation of chronic medical conditions, medication management and review of any available recent lab and radiology data.  Preventive health is updated, specifically  Cancer screening and Immunization.   Questions or concerns regarding consultations or procedures which the PT has had in the interim are  addressed. The PT denies any adverse reactions to current medications since the last visit.  Increased left knee pain with weather change, no inciting trauma or falls No adverse s/e with medication for weight loss and successful, weight loss  ROS Denies recent fever or chills. Denies sinus pressure, nasal congestion, ear pain or sore throat. Denies chest congestion, productive cough or wheezing. Denies chest pains, palpitations and leg swelling Denies abdominal pain, nausea, vomiting,diarrhea or constipation.   Denies dysuria, frequency, hesitancy or incontinence.  Denies headaches, seizures, numbness, or tingling. Denies depression, anxiety or insomnia. Denies skin break down or rash.   PE  BP (!) 158/100   Pulse 74   Ht 5\' 3"  (1.6 m)   Wt 244 lb (110.7 kg)   LMP 03/18/2013   SpO2 94%   BMI 43.22 kg/m   Patient alert and oriented and in no cardiopulmonary distress.  HEENT: No facial asymmetry, EOMI,     Neck supple .  Chest: Clear to auscultation bilaterally.  CVS: S1, S2 no murmurs, no S3.Regular rate.  ABD: Soft non tender.   Ext: No edema  MS: Adequate though reduced ROM spine, shoulders, hips and  markedly reduced in left knee with tenderness.  Skin: Intact, no ulcerations or rash noted.  Psych: Good eye contact, normal affect. Memory intact not anxious or depressed appearing.  CNS: CN 2-12 intact, power,  normal throughout.no focal deficits noted.   Assessment & Plan  Type 2 diabetes mellitus with other specified complication [facility]) Tami Pearson  is reminded of the importance of commitment to daily physical activity for 30 minutes or more, as able and the need to limit carbohydrate intake to 30 to 60 grams per meal to help with blood sugar control.   The need to take medication as prescribed, test blood sugar as directed, and to call between visits if there is a concern that blood sugar is uncontrolled is also discussed.   Tami Pearson is reminded of the importance of daily foot exam, annual eye examination, and good blood sugar, blood pressure and cholesterol control.     Latest Ref Rng & Units 08/06/2021   11:27 AM 08/19/2020    3:51 PM 01/01/2020    2:22 PM 01/01/2020    2:13 PM 03/08/2018   10:43 AM  Diabetic Labs  HbA1c 4.8 - 5.6 % 7.2  6.7  6.5   6.0   Chol 100 - 199 mg/dL 05/07/2018    185    HDL 909 mg/dL 60    48    Calc LDL 0 - 99 mg/dL >31    121    Triglycerides 0 - 149 mg/dL 97    624    Creatinine 0.57 - 1.00 mg/dL 469    5.07  2.25       11/20/2021    9:22 AM 09/10/2021   10:39 AM 08/06/2021   10:48 AM 08/06/2021   10:01 AM 05/06/2021   11:49 AM 11/19/2020    1:36 PM 08/19/2020    2:38 PM  BP/Weight  Systolic BP 148 119  150 130 546 568 127  Diastolic BP 91 74 90 82 80 80 88  Wt. (Lbs) 244   253.08  214 240.12  BMI 43.22 kg/m2   44.13 kg/m2  36.73 kg/m2 41.22 kg/m2      06/03/2015    1:30 PM 04/16/2015    2:00 PM  Foot/eye exam completion dates  Foot Form Completion Done Done        Essential hypertension Uncontrolled , increase amlodipine to 10 mg daily DASH diet and commitment to daily physical activity for a minimum of 30 minutes discussed and encouraged, as a part of hypertension management. The importance of attaining a healthy weight is also discussed.     11/20/2021    9:43 AM 11/20/2021    9:22 AM 09/10/2021   10:39 AM 08/06/2021   10:48 AM 08/06/2021   10:01 AM 05/06/2021   11:49 AM 11/19/2020    1:36 PM  BP/Weight  Systolic BP 517 001 749 449 675 916 384  Diastolic BP 665 91 74 90 82 80 80  Wt. (Lbs)   244   253.08  214  BMI  43.22 kg/m2   44.13 kg/m2  36.73 kg/m2       Morbid obesity Improved  Patient re-educated about  the importance of commitment to a  minimum of 150 minutes of exercise per week as able.  The importance of healthy food choices with portion control discussed, as well as eating regularly and within a 12 hour window most days. The need to choose "clean , green" food 50 to 75% of the time is discussed, as well as to make water the primary drink and set a goal of 64 ounces water daily.       11/20/2021    9:22 AM 08/06/2021   10:01 AM 11/19/2020    1:36 PM  Weight /BMI  Weight 244 lb 253 lb 1.3 oz 214 lb  Height 5\' 3"  (1.6 m) 5' 3.5" (1.613 m) 5\' 4"  (1.626 m)  BMI 43.22 kg/m2 44.13 kg/m2 36.73 kg/m2

## 2021-11-20 NOTE — Assessment & Plan Note (Signed)
Hyperlipidemia:Low fat diet discussed and encouraged.   Lipid Panel  Lab Results  Component Value Date   CHOL 243 (H) 08/06/2021   HDL 60 08/06/2021   LDLCALC 166 (H) 08/06/2021   TRIG 97 08/06/2021   CHOLHDL 4.1 08/06/2021     Uncontrolled Updated lab needed at/ before next visit.

## 2021-11-20 NOTE — Assessment & Plan Note (Signed)
Tami Pearson is reminded of the importance of commitment to daily physical activity for 30 minutes or more, as able and the need to limit carbohydrate intake to 30 to 60 grams per meal to help with blood sugar control.   The need to take medication as prescribed, test blood sugar as directed, and to call between visits if there is a concern that blood sugar is uncontrolled is also discussed.   Tami Pearson is reminded of the importance of daily foot exam, annual eye examination, and good blood sugar, blood pressure and cholesterol control.     Latest Ref Rng & Units 08/06/2021   11:27 AM 08/19/2020    3:51 PM 01/01/2020    2:22 PM 01/01/2020    2:13 PM 03/08/2018   10:43 AM  Diabetic Labs  HbA1c 4.8 - 5.6 % 7.2  6.7  6.5   6.0   Chol 100 - 199 mg/dL 243    209    HDL >39 mg/dL 60    48    Calc LDL 0 - 99 mg/dL 166    140    Triglycerides 0 - 149 mg/dL 97    119    Creatinine 0.57 - 1.00 mg/dL 0.75    0.93  1.02       11/20/2021    9:22 AM 09/10/2021   10:39 AM 08/06/2021   10:48 AM 08/06/2021   10:01 AM 05/06/2021   11:49 AM 11/19/2020    1:36 PM 08/19/2020    2:38 PM  BP/Weight  Systolic BP 798 921 194 174 081 448 185  Diastolic BP 91 74 90 82 80 80 88  Wt. (Lbs) 244   253.08  214 240.12  BMI 43.22 kg/m2   44.13 kg/m2  36.73 kg/m2 41.22 kg/m2      06/03/2015    1:30 PM 04/16/2015    2:00 PM  Foot/eye exam completion dates  Foot Form Completion Done Done

## 2021-11-20 NOTE — Patient Instructions (Addendum)
F/u in 6 to 8 weeks re evaluate blood pressure, weight and blood sugar   Lipid, cmp and EGGFR, microaalb and hBA1C today  Increase amlodipine to 10 mg daily, blood pressure is still too high  Flu vaccine today  Need covid and shingrix vaccines please get at pharmacy  Nurse pls order blood sugar monitor and test supplies for once daily testing  .Goal for fasting blood sugar ranges from 80 to 120 and 2 hours after any meal or at bedtime should be between 130 to 170.  Thanks for choosing [facility], we consider it a privelige to serve you.  Use tylenol and gabapentin for knee  pain and topical rubs

## 2021-11-22 LAB — CMP14+EGFR
ALT: 14 IU/L (ref 0–32)
AST: 17 IU/L (ref 0–40)
Albumin/Globulin Ratio: 1.1 — ABNORMAL LOW (ref 1.2–2.2)
Albumin: 4.2 g/dL (ref 3.8–4.9)
Alkaline Phosphatase: 75 IU/L (ref 44–121)
BUN/Creatinine Ratio: 18 (ref 9–23)
BUN: 16 mg/dL (ref 6–24)
Bilirubin Total: 0.3 mg/dL (ref 0.0–1.2)
CO2: 22 mmol/L (ref 20–29)
Calcium: 9.4 mg/dL (ref 8.7–10.2)
Chloride: 104 mmol/L (ref 96–106)
Creatinine, Ser: 0.89 mg/dL (ref 0.57–1.00)
Globulin, Total: 3.7 g/dL (ref 1.5–4.5)
Glucose: 107 mg/dL — ABNORMAL HIGH (ref 70–99)
Potassium: 4 mmol/L (ref 3.5–5.2)
Sodium: 140 mmol/L (ref 134–144)
Total Protein: 7.9 g/dL (ref 6.0–8.5)
eGFR: 76 mL/min/{1.73_m2} (ref >59)

## 2021-11-22 LAB — HEMOGLOBIN A1C
Est. average glucose Bld gHb Est-mCnc: 137 mg/dL
Hgb A1c MFr Bld: 6.4 % — ABNORMAL HIGH (ref 4.8–5.6)

## 2021-11-22 LAB — LIPID PANEL
Chol/HDL Ratio: 4.1 ratio (ref 0.0–4.4)
Cholesterol, Total: 199 mg/dL (ref 100–199)
HDL: 49 mg/dL (ref >39)
LDL Chol Calc (NIH): 137 mg/dL — ABNORMAL HIGH (ref 0–99)
Triglycerides: 72 mg/dL (ref 0–149)
VLDL Cholesterol Cal: 13 mg/dL (ref 5–40)

## 2021-11-22 LAB — MICROALBUMIN / CREATININE URINE RATIO
Creatinine, Urine: 251.1 mg/dL
Microalb/Creat Ratio: 4 mg/g creat (ref 0–29)
Microalbumin, Urine: 10.9 ug/mL

## 2021-11-23 ENCOUNTER — Telehealth: Payer: Self-pay | Admitting: Family Medicine

## 2021-11-23 NOTE — Telephone Encounter (Signed)
Returning call for labs 

## 2021-11-23 NOTE — Telephone Encounter (Signed)
Patient aware.

## 2021-11-24 ENCOUNTER — Other Ambulatory Visit: Payer: Self-pay

## 2021-11-24 MED ORDER — OZEMPIC (0.25 OR 0.5 MG/DOSE) 2 MG/3ML ~~LOC~~ SOPN: PEN_INJECTOR | SUBCUTANEOUS | 0 refills | Status: DC

## 2021-12-01 DIAGNOSIS — H5213 Myopia, bilateral: Secondary | ICD-10-CM | POA: Insufficient documentation

## 2021-12-01 DIAGNOSIS — H25813 Combined forms of age-related cataract, bilateral: Secondary | ICD-10-CM | POA: Diagnosis not present

## 2021-12-01 DIAGNOSIS — E109 Type 1 diabetes mellitus without complications: Secondary | ICD-10-CM | POA: Insufficient documentation

## 2021-12-01 LAB — HM DIABETES EYE EXAM

## 2022-01-01 ENCOUNTER — Ambulatory Visit: Payer: Medicare HMO | Admitting: Family Medicine

## 2022-01-06 ENCOUNTER — Telehealth (INDEPENDENT_AMBULATORY_CARE_PROVIDER_SITE_OTHER): Payer: Medicare HMO | Admitting: Psychiatry

## 2022-01-06 ENCOUNTER — Encounter (HOSPITAL_COMMUNITY): Payer: Self-pay | Admitting: Psychiatry

## 2022-01-06 DIAGNOSIS — F3341 Major depressive disorder, recurrent, in partial remission: Secondary | ICD-10-CM | POA: Diagnosis not present

## 2022-01-06 DIAGNOSIS — F5102 Adjustment insomnia: Secondary | ICD-10-CM

## 2022-01-06 DIAGNOSIS — H2513 Age-related nuclear cataract, bilateral: Secondary | ICD-10-CM | POA: Diagnosis not present

## 2022-01-06 DIAGNOSIS — H2511 Age-related nuclear cataract, right eye: Secondary | ICD-10-CM | POA: Diagnosis not present

## 2022-01-06 MED ORDER — TRAZODONE HCL 50 MG PO TABS: 50.0000 mg | ORAL_TABLET | Freq: Every day | ORAL | 1 refills | Status: DC

## 2022-01-06 MED ORDER — BUPROPION HCL ER (XL) 150 MG PO TB24: 150.0000 mg | ORAL_TABLET | ORAL | 1 refills | Status: DC

## 2022-01-06 MED ORDER — LORAZEPAM 0.5 MG PO TABS: 0.5000 mg | ORAL_TABLET | Freq: Every day | ORAL | 3 refills | Status: DC | PRN

## 2022-01-06 MED ORDER — DULOXETINE HCL 60 MG PO CPEP: 60.0000 mg | ORAL_CAPSULE | Freq: Two times a day (BID) | ORAL | 2 refills | Status: DC
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
## 2022-01-11 DIAGNOSIS — H2511 Age-related nuclear cataract, right eye: Secondary | ICD-10-CM | POA: Diagnosis not present

## 2022-01-13 DIAGNOSIS — R1084 Generalized abdominal pain: Secondary | ICD-10-CM | POA: Diagnosis not present

## 2022-01-13 DIAGNOSIS — K921 Melena: Secondary | ICD-10-CM | POA: Diagnosis not present

## 2022-01-13 DIAGNOSIS — K922 Gastrointestinal hemorrhage, unspecified: Secondary | ICD-10-CM | POA: Diagnosis not present

## 2022-01-13 DIAGNOSIS — R109 Unspecified abdominal pain: Secondary | ICD-10-CM | POA: Diagnosis not present

## 2022-01-13 DIAGNOSIS — Z888 Allergy status to other drugs, medicaments and biological substances status: Secondary | ICD-10-CM | POA: Diagnosis not present

## 2022-01-13 DIAGNOSIS — Z886 Allergy status to analgesic agent status: Secondary | ICD-10-CM | POA: Diagnosis not present

## 2022-01-18 ENCOUNTER — Other Ambulatory Visit: Payer: Self-pay | Admitting: Family Medicine

## 2022-01-19 DIAGNOSIS — H5712 Ocular pain, left eye: Secondary | ICD-10-CM | POA: Diagnosis not present

## 2022-01-19 DIAGNOSIS — H2512 Age-related nuclear cataract, left eye: Secondary | ICD-10-CM | POA: Diagnosis not present

## 2022-01-21 DIAGNOSIS — E119 Type 2 diabetes mellitus without complications: Secondary | ICD-10-CM | POA: Insufficient documentation

## 2022-01-21 DIAGNOSIS — Z9841 Cataract extraction status, right eye: Secondary | ICD-10-CM | POA: Insufficient documentation

## 2022-01-28 ENCOUNTER — Encounter: Payer: Self-pay | Admitting: Family Medicine

## 2022-01-28 ENCOUNTER — Telehealth (INDEPENDENT_AMBULATORY_CARE_PROVIDER_SITE_OTHER): Payer: Medicare HMO | Admitting: Family Medicine

## 2022-01-28 VITALS — BP 117/72 | Ht 63.0 in | Wt 230.0 lb

## 2022-01-28 DIAGNOSIS — I10 Essential (primary) hypertension: Secondary | ICD-10-CM

## 2022-01-28 DIAGNOSIS — Z6841 Body Mass Index (BMI) 40.0 and over, adult: Secondary | ICD-10-CM | POA: Diagnosis not present

## 2022-01-28 DIAGNOSIS — E1169 Type 2 diabetes mellitus with other specified complication: Secondary | ICD-10-CM

## 2022-01-28 MED ORDER — SEMAGLUTIDE(0.25 OR 0.5MG/DOS) 2 MG/3ML ~~LOC~~ SOPN: 0.5000 mg | PEN_INJECTOR | SUBCUTANEOUS | 0 refills | Status: DC

## 2022-01-28 NOTE — Patient Instructions (Addendum)
F/U in office first week in March , call if you need me sooner  Please bring all medication to your visit   Fasting labs at [facility]  Feb 20 to 29, 2024, fasting lipid, cmp and eGFr, hBA1C  Congrats on excellent weight loss, ensure that you eat regularly  meals with a lot of vegetables a,d sufficient healthy proteins, commit to 64 ounces water daily  It is important that you exercise regularly at least 30 minutes 5 times a week. If you develop chest pain, have severe difficulty breathing, or feel very tired, stop exercising immediately and seek medical attention    Thanks for choosing [facility], we consider it a privelige to serve you.

## 2022-01-31 ENCOUNTER — Encounter: Payer: Self-pay | Admitting: Family Medicine

## 2022-01-31 NOTE — Assessment & Plan Note (Addendum)
Uncontrolled at last  in office visit, needs in office visit to assess, reports normal home bP today DASH diet and commitment to daily physical activity for a minimum of 30 minutes discussed and encouraged, as a part of hypertension management. The importance of attaining a healthy weight is also discussed.     01/28/2022   10:35 AM 11/20/2021    9:43 AM 11/20/2021    9:22 AM 09/10/2021   10:39 AM 08/06/2021   10:48 AM 08/06/2021   10:01 AM 05/06/2021   11:49 AM  BP/Weight  Systolic BP 117 158 148 119 150 130 120  Diastolic BP 72 100 91 74 90 82 80  Wt. (Lbs) 230  244   253.08   BMI 40.74 kg/m2  43.22 kg/m2   44.13 kg/m2

## 2022-01-31 NOTE — Progress Notes (Signed)
Virtual Visit via Video Note  I connected with Tami Pearson on 12/28/2023at 10:40 AM EST by a video enabled telemedicine application and verified that I am speaking with the correct person using two identifiers.  Location: Patient: home Provider: offic   I discussed the limitations of evaluation and management by telemedicine and the availability of in person appointments. The patient expressed understanding and agreed to proceed.  History of Present Illness: F/u chronic probs in particular weight and blood pressure, needs in office assessment Re[ports doing well with no new concerns   Observations/Objective:  BP 117/72   Ht 5\' 3"  (1.6 m)   Wt 230 lb (104.3 kg)   LMP 03/18/2013   BMI 40.74 kg/m  Good communication with no confusion and intact memory. Alert and oriented x 3 No signs of respiratory distress during speech  Assessment and Plan: Essential hypertension Uncontrolled at last  in office visit, needs in office visit to assess, reports normal home bP today DASH diet and commitment to daily physical activity for a minimum of 30 minutes discussed and encouraged, as a part of hypertension management. The importance of attaining a healthy weight is also discussed.     01/28/2022   10:35 AM 11/20/2021    9:43 AM 11/20/2021    9:22 AM 09/10/2021   10:39 AM 08/06/2021   10:48 AM 08/06/2021   10:01 AM 05/06/2021   11:49 AM  BP/Weight  Systolic BP 117 158 148 119 150 130 120  Diastolic BP 72 100 91 74 90 82 80  Wt. (Lbs) 230  244   253.08   BMI 40.74 kg/m2  43.22 kg/m2   44.13 kg/m2        Type 2 diabetes mellitus with other specified complication Tami Pearson) Tami Pearson is reminded of the importance of commitment to daily physical activity for 30 minutes or more, as able and the need to limit carbohydrate intake to 30 to 60 grams per meal to help with blood sugar control.   The need to take medication as prescribed, test blood sugar as directed, and to call between visits  if there is a concern that blood sugar is uncontrolled is also discussed.   Tami Pearson is reminded of the importance of daily foot exam, annual eye examination, and good blood sugar, blood pressure and cholesterol control.     Latest Ref Rng & Units 11/20/2021    9:59 AM 08/06/2021   11:27 AM 08/19/2020    3:51 PM 01/01/2020    2:22 PM 01/01/2020    2:13 PM  Diabetic Labs  HbA1c 4.8 - 5.6 % 6.4  7.2  6.7  6.5    Micro/Creat Ratio 0 - 29 mg/g creat 4       Chol 100 - 199 mg/dL 01/03/2020  967    893   HDL 810 mg/dL 49  60    48   Calc LDL 0 - 99 mg/dL >17  510    258   Triglycerides 0 - 149 mg/dL 72  97    527   Creatinine 0.57 - 1.00 mg/dL 782  4.23    5.36       01/28/2022   10:35 AM 11/20/2021    9:43 AM 11/20/2021    9:22 AM 09/10/2021   10:39 AM 08/06/2021   10:48 AM 08/06/2021   10:01 AM 05/06/2021   11:49 AM  BP/Weight  Systolic BP 117 158 148 119 150 130 120  Diastolic BP 72 100 91 74 90  82 80  Wt. (Lbs) 230  244   253.08   BMI 40.74 kg/m2  43.22 kg/m2   44.13 kg/m2       Latest Ref Rng & Units 12/01/2021   12:00 AM 06/03/2015    1:30 PM  Foot/eye exam completion dates  Eye Exam No Retinopathy No Retinopathy       Foot Form Completion   Done     This result is from an external source.      Updated lab needed at/ before next visit.   Morbid obesity  Patient re-educated about  the importance of commitment to a  minimum of 150 minutes of exercise per week as able.  The importance of healthy food choices with portion control discussed, as well as eating regularly and within a 12 hour window most days. The need to choose "clean , green" food 50 to 75% of the time is discussed, as well as to make water the primary drink and set a goal of 64 ounces water daily.       01/28/2022   10:35 AM 11/20/2021    9:22 AM 08/06/2021   10:01 AM  Weight /BMI  Weight 230 lb 244 lb 253 lb 1.3 oz  Height 5\' 3"  (1.6 m) 5\' 3"  (1.6 m) 5' 3.5" (1.613 m)  BMI 40.74 kg/m2 43.22 kg/m2 44.13  kg/m2    Continue current med, nutrition and exercise reviewed, in office re eval needed   Follow Up Instructions:    I discussed the assessment and treatment plan with the patient. The patient was provided an opportunity to ask questions and all were answered. The patient agreed with the plan and demonstrated an understanding of the instructions.   The patient was advised to call back or seek an in-person evaluation if the symptoms worsen or if the condition fails to improve as anticipated.  I provided 12 minutes of non-face-to-face time during this encounter.   , MD

## 2022-01-31 NOTE — Assessment & Plan Note (Signed)
  Patient re-educated about  the importance of commitment to a  minimum of 150 minutes of exercise per week as able.  The importance of healthy food choices with portion control discussed, as well as eating regularly and within a 12 hour window most days. The need to choose "clean , green" food 50 to 75% of the time is discussed, as well as to make water the primary drink and set a goal of 64 ounces water daily.       01/28/2022   10:35 AM 11/20/2021    9:22 AM 08/06/2021   10:01 AM  Weight /BMI  Weight 230 lb 244 lb 253 lb 1.3 oz  Height 5\' 3"  (1.6 m) 5\' 3"  (1.6 m) 5' 3.5" (1.613 m)  BMI 40.74 kg/m2 43.22 kg/m2 44.13 kg/m2    Continue current med, nutrition and exercise reviewed, in office re eval needed

## 2022-01-31 NOTE — Assessment & Plan Note (Signed)
Tami Pearson is reminded of the importance of commitment to daily physical activity for 30 minutes or more, as able and the need to limit carbohydrate intake to 30 to 60 grams per meal to help with blood sugar control.   The need to take medication as prescribed, test blood sugar as directed, and to call between visits if there is a concern that blood sugar is uncontrolled is also discussed.   Tami Pearson is reminded of the importance of daily foot exam, annual eye examination, and good blood sugar, blood pressure and cholesterol control.     Latest Ref Rng & Units 11/20/2021    9:59 AM 08/06/2021   11:27 AM 08/19/2020    3:51 PM 01/01/2020    2:22 PM 01/01/2020    2:13 PM  Diabetic Labs  HbA1c 4.8 - 5.6 % 6.4  7.2  6.7  6.5    Micro/Creat Ratio 0 - 29 mg/g creat 4       Chol 100 - 199 mg/dL 573  220    254   HDL >27 mg/dL 49  60    48   Calc LDL 0 - 99 mg/dL 062  376    283   Triglycerides 0 - 149 mg/dL 72  97    151   Creatinine 0.57 - 1.00 mg/dL 7.61  6.07    3.71       01/28/2022   10:35 AM 11/20/2021    9:43 AM 11/20/2021    9:22 AM 09/10/2021   10:39 AM 08/06/2021   10:48 AM 08/06/2021   10:01 AM 05/06/2021   11:49 AM  BP/Weight  Systolic BP 117 158 148 119 150 130 120  Diastolic BP 72 100 91 74 90 82 80  Wt. (Lbs) 230  244   253.08   BMI 40.74 kg/m2  43.22 kg/m2   44.13 kg/m2       Latest Ref Rng & Units 12/01/2021   12:00 AM 06/03/2015    1:30 PM  Foot/eye exam completion dates  Eye Exam No Retinopathy No Retinopathy       Foot Form Completion   Done     This result is from an external source.      Updated lab needed at/ before next visit.

## 2022-02-02 DIAGNOSIS — Z9842 Cataract extraction status, left eye: Secondary | ICD-10-CM | POA: Insufficient documentation

## 2022-03-03 ENCOUNTER — Other Ambulatory Visit: Payer: Self-pay | Admitting: Family Medicine

## 2022-03-03 DIAGNOSIS — I10 Essential (primary) hypertension: Secondary | ICD-10-CM

## 2022-03-23 ENCOUNTER — Other Ambulatory Visit: Payer: Self-pay | Admitting: Family Medicine

## 2022-03-23 DIAGNOSIS — M549 Dorsalgia, unspecified: Secondary | ICD-10-CM

## 2022-03-23 DIAGNOSIS — M170 Bilateral primary osteoarthritis of knee: Secondary | ICD-10-CM

## 2022-03-23 DIAGNOSIS — E669 Obesity, unspecified: Secondary | ICD-10-CM

## 2022-04-07 ENCOUNTER — Ambulatory Visit: Payer: Medicare HMO | Admitting: Family Medicine

## 2022-04-12 ENCOUNTER — Other Ambulatory Visit: Payer: Self-pay | Admitting: Family Medicine

## 2022-04-15 ENCOUNTER — Other Ambulatory Visit: Payer: Self-pay | Admitting: Family Medicine

## 2022-04-20 ENCOUNTER — Ambulatory Visit: Payer: Medicare HMO | Admitting: Family Medicine

## 2022-04-20 DIAGNOSIS — Z886 Allergy status to analgesic agent status: Secondary | ICD-10-CM | POA: Diagnosis not present

## 2022-04-20 DIAGNOSIS — M25512 Pain in left shoulder: Secondary | ICD-10-CM | POA: Diagnosis not present

## 2022-04-20 DIAGNOSIS — I10 Essential (primary) hypertension: Secondary | ICD-10-CM | POA: Diagnosis not present

## 2022-04-20 DIAGNOSIS — Z885 Allergy status to narcotic agent status: Secondary | ICD-10-CM | POA: Diagnosis not present

## 2022-04-20 DIAGNOSIS — Z888 Allergy status to other drugs, medicaments and biological substances status: Secondary | ICD-10-CM | POA: Diagnosis not present

## 2022-04-22 ENCOUNTER — Encounter: Payer: Self-pay | Admitting: Family Medicine

## 2022-04-22 ENCOUNTER — Ambulatory Visit (INDEPENDENT_AMBULATORY_CARE_PROVIDER_SITE_OTHER): Payer: Medicare HMO | Admitting: Orthopedic Surgery

## 2022-04-22 ENCOUNTER — Ambulatory Visit (INDEPENDENT_AMBULATORY_CARE_PROVIDER_SITE_OTHER): Payer: Medicare HMO | Admitting: Family Medicine

## 2022-04-22 ENCOUNTER — Other Ambulatory Visit (INDEPENDENT_AMBULATORY_CARE_PROVIDER_SITE_OTHER): Payer: Medicare HMO

## 2022-04-22 ENCOUNTER — Encounter: Payer: Self-pay | Admitting: Orthopedic Surgery

## 2022-04-22 VITALS — BP 152/84 | HR 70 | Ht 63.0 in | Wt 241.0 lb

## 2022-04-22 VITALS — BP 152/84 | Ht 63.0 in | Wt 241.0 lb

## 2022-04-22 DIAGNOSIS — E559 Vitamin D deficiency, unspecified: Secondary | ICD-10-CM

## 2022-04-22 DIAGNOSIS — E1169 Type 2 diabetes mellitus with other specified complication: Secondary | ICD-10-CM

## 2022-04-22 DIAGNOSIS — M792 Neuralgia and neuritis, unspecified: Secondary | ICD-10-CM

## 2022-04-22 DIAGNOSIS — M25512 Pain in left shoulder: Secondary | ICD-10-CM | POA: Diagnosis not present

## 2022-04-22 DIAGNOSIS — Z1231 Encounter for screening mammogram for malignant neoplasm of breast: Secondary | ICD-10-CM | POA: Diagnosis not present

## 2022-04-22 DIAGNOSIS — M7552 Bursitis of left shoulder: Secondary | ICD-10-CM | POA: Insufficient documentation

## 2022-04-22 DIAGNOSIS — I10 Essential (primary) hypertension: Secondary | ICD-10-CM

## 2022-04-22 DIAGNOSIS — E785 Hyperlipidemia, unspecified: Secondary | ICD-10-CM

## 2022-04-22 MED ORDER — CARVEDILOL 3.125 MG PO TABS: 3.1250 mg | ORAL_TABLET | Freq: Two times a day (BID) | ORAL | 3 refills | Status: DC

## 2022-04-22 MED ORDER — METHOCARBAMOL 750 MG PO TABS: 750.0000 mg | ORAL_TABLET | Freq: Four times a day (QID) | ORAL | 2 refills | Status: DC

## 2022-04-22 MED ORDER — METHYLPREDNISOLONE ACETATE 40 MG/ML IJ SUSP
40.0000 mg | Freq: Once | INTRAMUSCULAR | Status: AC
  Administered 2022-04-22: 40 mg via INTRA_ARTICULAR

## 2022-04-22 MED ORDER — PREDNISONE 10 MG (48) PO TBPK: ORAL_TABLET | Freq: Every day | ORAL | 0 refills | Status: DC

## 2022-04-22 MED ORDER — HYDROCODONE-ACETAMINOPHEN 5-325 MG PO TABS: ORAL_TABLET | ORAL | 0 refills | Status: DC

## 2022-04-22 NOTE — Assessment & Plan Note (Addendum)
Uncontrolled  DASH diet and commitment to daily physical activity for a minimum of 30 minutes discussed and encouraged, as a part of hypertension management. The importance of attaining a healthy weight is also discussed.     04/22/2022   10:19 AM 04/22/2022    9:32 AM 04/22/2022    9:02 AM 04/22/2022    8:59 AM 01/28/2022   10:35 AM 11/20/2021    9:43 AM 11/20/2021    9:22 AM  BP/Weight  Systolic BP 0000000 0000000 [id number] 0000000 [id number]  Diastolic BP 84 84 82 86 72 100 91  Wt. (Lbs) 241   241.04 230  244  BMI 42.69 kg/m2   42.7 kg/m2 40.74 kg/m2  43.22 kg/m2    Add carvedilol twice daily, and r eval in 6 to 8 weeks

## 2022-04-22 NOTE — Progress Notes (Signed)
Tami Pearson     MRN: [id number]      DOB: March 25, 1965   HPI Tami Pearson is here for follow up and re-evaluation of chronic medical conditions, medication management and review of any available recent lab and radiology data.  Preventive health is updated, specifically  Cancer screening and Immunization.   C/o severe debilitating left shoulder and upper arm pain, no inciting trauma,treatment started at [facility] 2 days ago The PT denies any adverse reactions to current medications since the last visit.  ROS Denies recent fever or chills. Denies sinus pressure, nasal congestion, ear pain or sore throat. Denies chest congestion, productive cough or wheezing. Denies chest pains, palpitations and leg swelling Denies abdominal pain, nausea, vomiting,diarrhea or constipation.   Denies dysuria, frequency, hesitancy or incontinence. Denies joint pain, swelling and limitation in mobility. Denies headaches, seizures, numbness, or tingling. Denies depression, anxiety or insomnia. Denies skin break down or rash.   PE  BP (!) 152/84   Pulse 70   Ht 5\' 3"  (1.6 m)   Wt 241 lb 0.6 oz (109.3 kg)   LMP 03/18/2013   SpO2 95%   BMI 42.70 kg/m   Patient alert and oriented and in no cardiopulmonary distress.  HEENT: No facial asymmetry, EOMI,     Neck supple .  Chest: Clear to auscultation bilaterally.  CVS: S1, S2 no murmurs, no S3.Regular rate.  ABD: Soft non tender.   Ext: No edema  MS: Adequate ROM spine, , hips and knees.Swelling tenderness and reduced ROM left shoulder  Skin: Intact, no ulcerations or rash noted.  Psych: Good eye contact, normal affect. Memory intact not anxious or depressed appearing.  CNS: CN 2-12 intact, power,  normal throughout.no focal deficits noted.   Assessment & Plan  Bursitis of left shoulder Debilitating left shoulder pin, seen in ED 2 days ago, no relief with treatment started, awakens her from sleep and prevents /  limits ADL's Hydrocodone  prescribed for 5 days only  Essential hypertension Uncontrolled  DASH diet and commitment to daily physical activity for a minimum of 30 minutes discussed and encouraged, as a part of hypertension management. The importance of attaining a healthy weight is also discussed.     04/22/2022   10:19 AM 04/22/2022    9:32 AM 04/22/2022    9:02 AM 04/22/2022    8:59 AM 01/28/2022   10:35 AM 11/20/2021    9:43 AM 11/20/2021    9:22 AM  BP/Weight  Systolic BP 0000000 0000000 123XX123 Q000111Q 123XX123 0000000 123456  Diastolic BP 84 84 82 86 72 100 91  Wt. (Lbs) 241   241.04 230  244  BMI 42.69 kg/m2   42.7 kg/m2 40.74 kg/m2  43.22 kg/m2    Add carvedilol twice daily, and r eval in 6 to 8 weeks   Type 2 diabetes mellitus with other specified complication [facility]) Tami Pearson is reminded of the importance of commitment to daily physical activity for 30 minutes or more, as able and the need to limit carbohydrate intake to 30 to 60 grams per meal to help with blood sugar control.   The need to take medication as prescribed, test blood sugar as directed, and to call between visits if there is a concern that blood sugar is uncontrolled is also discussed.   Tami Pearson is reminded of the importance of daily foot exam, annual eye examination, and good blood sugar, blood pressure and cholesterol control.     Latest Ref Rng & Units 11/20/2021  9:59 AM 08/06/2021   11:27 AM 08/19/2020    3:51 PM 01/01/2020    2:22 PM 01/01/2020    2:13 PM  Diabetic Labs  HbA1c 4.8 - 5.6 % 6.4  7.2  6.7  6.5    Micro/Creat Ratio 0 - 29 mg/g creat 4       Chol 100 - 199 mg/dL 199  243    209   HDL >39 mg/dL 49  60    48   Calc LDL 0 - 99 mg/dL 137  166    140   Triglycerides 0 - 149 mg/dL 72  97    119   Creatinine 0.57 - 1.00 mg/dL 0.89  0.75    0.93       04/22/2022   10:19 AM 04/22/2022    9:32 AM 04/22/2022    9:02 AM 04/22/2022    8:59 AM 01/28/2022   10:35 AM 11/20/2021    9:43 AM 11/20/2021    9:22 AM  BP/Weight  Systolic BP 0000000  0000000 123XX123 Q000111Q 123XX123 0000000 123456  Diastolic BP 84 84 82 86 72 100 91  Wt. (Lbs) 241   241.04 230  244  BMI 42.69 kg/m2   42.7 kg/m2 40.74 kg/m2  43.22 kg/m2      Latest Ref Rng & Units 12/01/2021   12:00 AM 06/03/2015    1:30 PM  Foot/eye exam completion dates  Eye Exam No Retinopathy No Retinopathy       Foot Form Completion   Done     This result is from an external source.      Updated lab needed at/ before next visit.   Morbid obesity  Patient re-educated about  the importance of commitment to a  minimum of 150 minutes of exercise per week as able.  The importance of healthy food choices with portion control discussed, as well as eating regularly and within a 12 hour window most days. The need to choose "clean , green" food 50 to 75% of the time is discussed, as well as to make water the primary drink and set a goal of 64 ounces water daily.       04/22/2022   10:19 AM 04/22/2022    8:59 AM 01/28/2022   10:35 AM  Weight /BMI  Weight 241 lb 241 lb 0.6 oz 230 lb  Height 5\' 3"  (1.6 m) 5\' 3"  (1.6 m) 5\' 3"  (1.6 m)  BMI 42.69 kg/m2 42.7 kg/m2 40.74 kg/m2      Vitamin D deficiency Updated lab needed at/ before next visit.   Hyperlipidemia LDL goal <100 Hyperlipidemia:Low fat diet discussed and encouraged.   Lipid Panel  Lab Results  Component Value Date   CHOL 199 11/20/2021   HDL 49 11/20/2021   LDLCALC 137 (H) 11/20/2021   TRIG 72 11/20/2021   CHOLHDL 4.1 11/20/2021     Needs to reduce fat in diet, updated lab today

## 2022-04-22 NOTE — Patient Instructions (Addendum)
F/U in 5 to 7  weeks re evaluate blood pressure and labs  New additional medication for blood pressure is carvedilol take one twice daily , 12 hours apart, example 8 am and 8pm  You are referred urgently to orthopedics re bursitis left shoulder , take prednisone as prescribed  Labs today, CBC, lipid, cmp and eGFr, HBA1c, TSH and vit D  Please schedule July mammogram at checkout at [facility]   5 day supply of hydrocodone is prescribed for shoulder pain  Thanks for choosing [facility], we consider it a privelige to serve you.

## 2022-04-22 NOTE — Assessment & Plan Note (Signed)
Tami Pearson is reminded of the importance of commitment to daily physical activity for 30 minutes or more, as able and the need to limit carbohydrate intake to 30 to 60 grams per meal to help with blood sugar control.   The need to take medication as prescribed, test blood sugar as directed, and to call between visits if there is a concern that blood sugar is uncontrolled is also discussed.   Tami Pearson is reminded of the importance of daily foot exam, annual eye examination, and good blood sugar, blood pressure and cholesterol control.     Latest Ref Rng & Units 11/20/2021    9:59 AM 08/06/2021   11:27 AM 08/19/2020    3:51 PM 01/01/2020    2:22 PM 01/01/2020    2:13 PM  Diabetic Labs  HbA1c 4.8 - 5.6 % 6.4  7.2  6.7  6.5    Micro/Creat Ratio 0 - 29 mg/g creat 4       Chol 100 - 199 mg/dL 199  243    209   HDL >39 mg/dL 49  60    48   Calc LDL 0 - 99 mg/dL 137  166    140   Triglycerides 0 - 149 mg/dL 72  97    119   Creatinine 0.57 - 1.00 mg/dL 0.89  0.75    0.93       04/22/2022   10:19 AM 04/22/2022    9:32 AM 04/22/2022    9:02 AM 04/22/2022    8:59 AM 01/28/2022   10:35 AM 11/20/2021    9:43 AM 11/20/2021    9:22 AM  BP/Weight  Systolic BP 0000000 0000000 123XX123 Q000111Q 123XX123 0000000 123456  Diastolic BP 84 84 82 86 72 100 91  Wt. (Lbs) 241   241.04 230  244  BMI 42.69 kg/m2   42.7 kg/m2 40.74 kg/m2  43.22 kg/m2      Latest Ref Rng & Units 12/01/2021   12:00 AM 06/03/2015    1:30 PM  Foot/eye exam completion dates  Eye Exam No Retinopathy No Retinopathy       Foot Form Completion   Done     This result is from an external source.      Updated lab needed at/ before next visit.

## 2022-04-22 NOTE — Assessment & Plan Note (Signed)
Updated lab needed at/ before next visit.   
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
## 2022-04-22 NOTE — Assessment & Plan Note (Addendum)
Debilitating left shoulder pin, seen in ED 2 days ago, no relief with treatment started, awakens her from sleep and prevents /  limits ADL's Hydrocodone prescribed for 5 days only

## 2022-04-22 NOTE — Assessment & Plan Note (Signed)
Hyperlipidemia:Low fat diet discussed and encouraged.   Lipid Panel  Lab Results  Component Value Date   CHOL 199 11/20/2021   HDL 49 11/20/2021   LDLCALC 137 (H) 11/20/2021   TRIG 72 11/20/2021   CHOLHDL 4.1 11/20/2021     Needs to reduce fat in diet, updated lab today

## 2022-04-22 NOTE — Assessment & Plan Note (Signed)
  Patient re-educated about  the importance of commitment to a  minimum of 150 minutes of exercise per week as able.  The importance of healthy food choices with portion control discussed, as well as eating regularly and within a 12 hour window most days. The need to choose "clean , green" food 50 to 75% of the time is discussed, as well as to make water the primary drink and set a goal of 64 ounces water daily.       04/22/2022   10:19 AM 04/22/2022    8:59 AM 01/28/2022   10:35 AM  Weight /BMI  Weight 241 lb 241 lb 0.6 oz 230 lb  Height 5\' 3"  (1.6 m) 5\' 3"  (1.6 m) 5\' 3"  (1.6 m)  BMI 42.69 kg/m2 42.7 kg/m2 40.74 kg/m2

## 2022-04-23 ENCOUNTER — Other Ambulatory Visit: Payer: Self-pay

## 2022-04-23 LAB — HEMOGLOBIN A1C
Est. average glucose Bld gHb Est-mCnc: 146 mg/dL
Hgb A1c MFr Bld: 6.7 % — ABNORMAL HIGH (ref 4.8–5.6)

## 2022-04-23 LAB — CMP14+EGFR
ALT: 14 IU/L (ref 0–32)
AST: 13 IU/L (ref 0–40)
Albumin/Globulin Ratio: 1.1 — ABNORMAL LOW (ref 1.2–2.2)
Albumin: 4.1 g/dL (ref 3.8–4.9)
Alkaline Phosphatase: 68 IU/L (ref 44–121)
BUN/Creatinine Ratio: 21 (ref 9–23)
BUN: 18 mg/dL (ref 6–24)
Bilirubin Total: 0.2 mg/dL (ref 0.0–1.2)
CO2: 24 mmol/L (ref 20–29)
Calcium: 9.9 mg/dL (ref 8.7–10.2)
Chloride: 100 mmol/L (ref 96–106)
Creatinine, Ser: 0.87 mg/dL (ref 0.57–1.00)
Globulin, Total: 3.9 g/dL (ref 1.5–4.5)
Glucose: 146 mg/dL — ABNORMAL HIGH (ref 70–99)
Potassium: 3.7 mmol/L (ref 3.5–5.2)
Sodium: 139 mmol/L (ref 134–144)
Total Protein: 8 g/dL (ref 6.0–8.5)
eGFR: 78 mL/min/{1.73_m2} (ref >59)

## 2022-04-23 LAB — CBC
Hematocrit: 40.5 % (ref 34.0–46.6)
Hemoglobin: 13.2 g/dL (ref 11.1–15.9)
MCH: 28.4 pg (ref 26.6–33.0)
MCHC: 32.6 g/dL (ref 31.5–35.7)
MCV: 87 fL (ref 79–97)
Platelets: 361 10*3/uL (ref 150–450)
RBC: 4.64 x10E6/uL (ref 3.77–5.28)
RDW: 13.4 % (ref 11.7–15.4)
WBC: 9.1 10*3/uL (ref 3.4–10.8)

## 2022-04-23 LAB — LIPID PANEL
Chol/HDL Ratio: 3.1 ratio (ref 0.0–4.4)
Cholesterol, Total: 165 mg/dL (ref 100–199)
HDL: 53 mg/dL (ref >39)
LDL Chol Calc (NIH): 92 mg/dL (ref 0–99)
Triglycerides: 108 mg/dL (ref 0–149)
VLDL Cholesterol Cal: 20 mg/dL (ref 5–40)

## 2022-04-23 LAB — TSH: TSH: 2.78 u[IU]/mL (ref 0.450–4.500)

## 2022-04-23 LAB — VITAMIN D 25 HYDROXY (VIT D DEFICIENCY, FRACTURES): Vit D, 25-Hydroxy: 34.4 ng/mL (ref 30.0–100.0)

## 2022-04-23 MED ORDER — TIRZEPATIDE 2.5 MG/0.5ML ~~LOC~~ SOAJ: 2.5000 mg | SUBCUTANEOUS | 1 refills | Status: DC

## 2022-04-23 MED ORDER — OZEMPIC (0.25 OR 0.5 MG/DOSE) 2 MG/3ML ~~LOC~~ SOPN: PEN_INJECTOR | SUBCUTANEOUS | 3 refills | Status: DC

## 2022-04-23 NOTE — Addendum Note (Signed)
Addended by: Fayrene Helper on: 04/23/2022 04:14 PM   Modules accepted: Orders

## 2022-05-03 ENCOUNTER — Telehealth: Payer: Self-pay | Admitting: Family Medicine

## 2022-05-03 NOTE — Telephone Encounter (Signed)
Prescription Request  05/03/2022  LOV: 04/22/2022  What is the name of the medication or equipment? HYDROcodone-acetaminophen (NORCO/VICODIN) 5-325 MG tablet [id number]   Have you contacted your pharmacy to request a refill? Yes   Which pharmacy would you like this sent to?  [facility] [address], [place] [address]    Patient notified that their request is being sent to the clinical staff for review and that they should receive a response within 2 business days.   Please advise at Mobile [phone] (mobile)

## 2022-05-04 ENCOUNTER — Telehealth (HOSPITAL_COMMUNITY): Payer: Medicare HMO | Admitting: Psychiatry

## 2022-05-06 ENCOUNTER — Telehealth: Payer: Self-pay | Admitting: Orthopedic Surgery

## 2022-05-06 ENCOUNTER — Other Ambulatory Visit: Payer: Self-pay | Admitting: Orthopedic Surgery

## 2022-05-06 DIAGNOSIS — M792 Neuralgia and neuritis, unspecified: Secondary | ICD-10-CM

## 2022-05-06 DIAGNOSIS — M25512 Pain in left shoulder: Secondary | ICD-10-CM

## 2022-05-06 DIAGNOSIS — M7552 Bursitis of left shoulder: Secondary | ICD-10-CM

## 2022-05-06 MED ORDER — METHOCARBAMOL 750 MG PO TABS: 750.0000 mg | ORAL_TABLET | Freq: Four times a day (QID) | ORAL | 2 refills | Status: DC

## 2022-05-06 MED ORDER — PREDNISONE 10 MG (48) PO TBPK: ORAL_TABLET | Freq: Every day | ORAL | 0 refills | Status: DC

## 2022-05-06 MED ORDER — METHOCARBAMOL 750 MG PO TABS: 750.0000 mg | ORAL_TABLET | Freq: Four times a day (QID) | ORAL | 2 refills | Status: AC

## 2022-05-06 NOTE — Telephone Encounter (Signed)
Pt aware.

## 2022-05-06 NOTE — Telephone Encounter (Signed)
Yes, I selected incorrect pharmacy resubmitted to [facility] thanks

## 2022-05-06 NOTE — Telephone Encounter (Signed)
Advised to call Tami Pearson for a refill of pain med since he was managing her shoulder pain

## 2022-05-06 NOTE — Telephone Encounter (Signed)
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 10 mg ds 12 days as directed      Dispense:  48 tablet      Refill:  0   methocarbamol (ROBAXIN) 750 MG tablet      Sig: Take 1 tablet (750 mg total) by mouth 4 (four) times daily.      Dispense:  60 tablet      Refill:  2       Patient wants pain meds to [facility]

## 2022-05-06 NOTE — Telephone Encounter (Signed)
Dr. Ruthe Mannan pt - patient lvm stating that she just got a message regarding her medication, she stated it was called to the [facility] in [place], but it should've been called to [facility] in [place], [place].

## 2022-05-06 NOTE — Telephone Encounter (Signed)
Patient called back needs to speak to [facility]

## 2022-05-06 NOTE — Progress Notes (Signed)
Meds ordered this encounter  Medications   methocarbamol (ROBAXIN) 750 MG tablet    Sig: Take 1 tablet (750 mg total) by mouth 4 (four) times daily.    Dispense:  60 tablet    Refill:  2   predniSONE (STERAPRED UNI-PAK 48 TAB) 10 MG (48) TBPK tablet    Sig: Take by mouth daily. 10 mg ds 12 days as directed    Dispense:  48 tablet    Refill:  0

## 2022-05-06 NOTE — Telephone Encounter (Signed)
Dr. Ruthe Mannan pt - spoke w/the patient, she is requesting pain medication be called to [facility] in [place], [place].  She is there this week watching her grandchildren.

## 2022-05-07 ENCOUNTER — Telehealth: Payer: Self-pay | Admitting: Orthopedic Surgery

## 2022-05-07 NOTE — Telephone Encounter (Signed)
Dr. Mort Sawyers pt - the pt lvm stating that she already has the medication that was sent in, she is wanting Hydrocodone.  She stated that Dr. Lodema Hong only gave her 5 days worth and she advised her to call Dr. Romeo Apple to see if he can prescribe it.  [phone]

## 2022-05-07 NOTE — Telephone Encounter (Signed)
No Dr Romeo Apple can not he can only give for surgery patients I called to advise lm for her to call back

## 2022-05-10 ENCOUNTER — Telehealth: Payer: Self-pay | Admitting: Orthopedic Surgery

## 2022-05-10 ENCOUNTER — Telehealth: Payer: Self-pay | Admitting: Family Medicine

## 2022-05-10 ENCOUNTER — Encounter (HOSPITAL_COMMUNITY): Payer: Self-pay | Admitting: Psychiatry

## 2022-05-10 ENCOUNTER — Telehealth (INDEPENDENT_AMBULATORY_CARE_PROVIDER_SITE_OTHER): Payer: Medicare HMO | Admitting: Psychiatry

## 2022-05-10 DIAGNOSIS — F3341 Major depressive disorder, recurrent, in partial remission: Secondary | ICD-10-CM | POA: Diagnosis not present

## 2022-05-10 DIAGNOSIS — F5102 Adjustment insomnia: Secondary | ICD-10-CM

## 2022-05-10 MED ORDER — TRAZODONE HCL 50 MG PO TABS: 50.0000 mg | ORAL_TABLET | Freq: Every day | ORAL | 1 refills | Status: DC

## 2022-05-10 MED ORDER — LORAZEPAM 0.5 MG PO TABS: 0.5000 mg | ORAL_TABLET | Freq: Every day | ORAL | 3 refills | Status: DC | PRN

## 2022-05-10 MED ORDER — BUPROPION HCL ER (XL) 150 MG PO TB24: 150.0000 mg | ORAL_TABLET | ORAL | 1 refills | Status: DC

## 2022-05-10 MED ORDER — DULOXETINE HCL 60 MG PO CPEP: 60.0000 mg | ORAL_CAPSULE | Freq: Two times a day (BID) | ORAL | 2 refills | Status: DC

## 2022-05-10 NOTE — Telephone Encounter (Signed)
New message   1. Which medications need to be refilled? (please list name of each medication and dose if known) HYDROcodone-acetaminophen (NORCO/VICODIN) 5-325 MG tablet   2. Which pharmacy/location (including street and city if local pharmacy) is medication to be sent to? [facility] - [place], [place] - [address]   3. Do they need a 30 day or 90 day supply? 30 day supply   Ortho unable to prescribe medication for patient

## 2022-05-10 NOTE — Telephone Encounter (Signed)
Pt called, spoke w/her and advised per Amy's note.
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
## 2022-05-11 NOTE — Telephone Encounter (Signed)
Patient aware.

## 2022-05-13 ENCOUNTER — Ambulatory Visit: Payer: Medicare HMO | Admitting: Orthopedic Surgery

## 2022-05-17 ENCOUNTER — Encounter: Payer: Self-pay | Admitting: Orthopedic Surgery

## 2022-05-17 ENCOUNTER — Telehealth: Payer: Self-pay | Admitting: Family Medicine

## 2022-05-17 ENCOUNTER — Ambulatory Visit (INDEPENDENT_AMBULATORY_CARE_PROVIDER_SITE_OTHER): Payer: Medicare HMO | Admitting: Orthopedic Surgery

## 2022-05-17 DIAGNOSIS — M5020 Other cervical disc displacement, unspecified cervical region: Secondary | ICD-10-CM | POA: Diagnosis not present

## 2022-05-17 MED ORDER — MELOXICAM 7.5 MG PO TABS: 7.5000 mg | ORAL_TABLET | Freq: Two times a day (BID) | ORAL | 1 refills | Status: DC

## 2022-05-17 NOTE — Telephone Encounter (Signed)
Spoke to patient

## 2022-05-17 NOTE — Patient Instructions (Signed)
While we are working on your approval for MRI please go ahead and call to schedule your appointment with [facility] , call today.   Central Scheduling [phone]

## 2022-05-17 NOTE — Progress Notes (Signed)
This is a follow-up visit  Chief Complaint  Patient presents with   Shoulder Pain    LT Pain is the same/ no improvement   Complains of neck and shoulder pain with some tingling in the left upper extremity unrelieved by prednisone and muscle relaxers  Reexamination reveals positive Spurling sign tenderness in the cervical spine some pain with resisted flexion and abduction of the shoulder but no weakness reflexes seem to be intact the hand shows a little bit of decreased sensation in C6-C7 nerve root distribution  Meds ordered this encounter  Medications   meloxicam (MOBIC) 7.5 MG tablet    Sig: Take 1 tablet (7.5 mg total) by mouth in the morning and at bedtime.    Dispense:  60 tablet    Refill:  1   Encounter Diagnosis  Name Primary?   Herniated cervical disc Yes    MRI cervical spine  Follow-up in the office for results and further treatment recommendation

## 2022-05-17 NOTE — Telephone Encounter (Signed)
Prescription Request  05/17/2022  LOV: 04/22/2022  What is the name of the medication or equipment? carvedilol (COREG) 3.125 MG tablet [[id number]]    Have you contacted your pharmacy to request a refill? No   Which pharmacy would you like this sent to?   [facility] - [place], [place] - [address] [place] [address] Phone: [phone]  Fax: [phone]    Patient notified that their request is being sent to the clinical staff for review and that they should receive a response within 2 business days.   Please advise at Mobile [phone] (mobile)

## 2022-05-26 ENCOUNTER — Telehealth: Payer: Self-pay | Admitting: Orthopedic Surgery

## 2022-05-26 NOTE — Telephone Encounter (Signed)
Dr. Mort Sawyers pt - [facility] [phone] lvm regarding the Meloxicam 7.5mg  that was sent in on 05/17/22.  They stated that the patient has a reported allergy to Ibuprofen and this medication has a potential sensitivity.  [facility] would like a call back for clarification.

## 2022-05-27 ENCOUNTER — Telehealth: Payer: Self-pay | Admitting: Orthopedic Surgery

## 2022-05-27 NOTE — Telephone Encounter (Signed)
Dr. Mort Sawyers pt - Loraine Leriche w/[facility] [phone] lvm stating they were speaking w/Amy and got disconnected.  When calling back, use reference # [id number].

## 2022-05-27 NOTE — Telephone Encounter (Signed)
I called and told pharmacy tech ok to give her the Meloxicam Then she transferred me to pharmacist I could not hold during clinic Disconnected call

## 2022-05-27 NOTE — Telephone Encounter (Signed)
Dr. Mort Sawyers pt - Tami Pearson w/[facility] [phone] lvm stating they were speaking w/Tami Pearson and got disconnected.  When calling back, use reference # [id number].   I will call back when I am not in clinic

## 2022-05-27 NOTE — Telephone Encounter (Signed)
Its nausea and vomiting to ibuprofen

## 2022-05-31 NOTE — Telephone Encounter (Signed)
I called and advised on to give Meloxicam.  Spoke to [place] and [place].

## 2022-06-04 ENCOUNTER — Ambulatory Visit: Payer: Medicare HMO | Admitting: Family Medicine

## 2022-06-08 ENCOUNTER — Telehealth (INDEPENDENT_AMBULATORY_CARE_PROVIDER_SITE_OTHER): Payer: Medicare HMO | Admitting: Family Medicine

## 2022-06-08 ENCOUNTER — Encounter: Payer: Self-pay | Admitting: Family Medicine

## 2022-06-08 DIAGNOSIS — Z7985 Long-term (current) use of injectable non-insulin antidiabetic drugs: Secondary | ICD-10-CM | POA: Diagnosis not present

## 2022-06-08 DIAGNOSIS — E785 Hyperlipidemia, unspecified: Secondary | ICD-10-CM

## 2022-06-08 DIAGNOSIS — F3341 Major depressive disorder, recurrent, in partial remission: Secondary | ICD-10-CM

## 2022-06-08 DIAGNOSIS — Z6841 Body Mass Index (BMI) 40.0 and over, adult: Secondary | ICD-10-CM | POA: Diagnosis not present

## 2022-06-08 DIAGNOSIS — E1169 Type 2 diabetes mellitus with other specified complication: Secondary | ICD-10-CM | POA: Diagnosis not present

## 2022-06-08 DIAGNOSIS — I10 Essential (primary) hypertension: Secondary | ICD-10-CM | POA: Diagnosis not present

## 2022-06-08 MED ORDER — SEMAGLUTIDE (1 MG/DOSE) 4 MG/3ML ~~LOC~~ SOPN: 1.0000 mg | PEN_INJECTOR | SUBCUTANEOUS | 0 refills | Status: DC

## 2022-06-08 NOTE — Progress Notes (Signed)
Virtual Visit via Video Note  I connected with Tami Pearson on 06/08/22 at  9:20 AM EDT by a video enabled telemedicine application and verified that I am speaking with the correct person using two identifiers.  Location: Patient: home  Provider: office   I discussed the limitations of evaluation and management by telemedicine and the availability of in person appointments. The patient expressed understanding and agreed to proceed.  History of Present Illness:   f/u chronic health problems, haing significant knee and back pain, Ortho managing Observations/Objective: LMP 03/18/2013  Good communication with no confusion and intact memory. Alert and oriented x 3 No signs of respiratory distress during speech   Assessment and Plan:  Essential hypertension Uncontrolled , needs in office re eval DASH diet and commitment to daily physical activity for a minimum of 30 minutes discussed and encouraged, as a part of hypertension management. The importance of attaining a healthy weight is also discussed.     04/22/2022   10:19 AM 04/22/2022    9:32 AM 04/22/2022    9:02 AM 04/22/2022    8:59 AM 01/28/2022   10:35 AM 11/20/2021    9:43 AM 11/20/2021    9:22 AM  BP/Weight  Systolic BP 152 152 170 172 117 158 148  Diastolic BP 84 84 82 86 72 100 91  Wt. (Lbs) 241   241.04 230  244  BMI 42.69 kg/m2   42.7 kg/m2 40.74 kg/m2  43.22 kg/m2       Morbid obesity  Patient re-educated about  the importance of commitment to a  minimum of 150 minutes of exercise per week as able.  The importance of healthy food choices with portion control discussed, as well as eating regularly and within a 12 hour window most days. The need to choose "clean , green" food 50 to 75% of the time is discussed, as well as to make water the primary drink and set a goal of 64 ounces water daily.       04/22/2022   10:19 AM 04/22/2022    8:59 AM 01/28/2022   10:35 AM  Weight /BMI  Weight 241 lb 241 lb 0.6  oz 230 lb  Height 5\' 3"  (1.6 m) 5\' 3"  (1.6 m) 5\' 3"  (1.6 m)  BMI 42.69 kg/m2 42.7 kg/m2 40.74 kg/m2    Increase ozempic dose  Type 2 diabetes mellitus with other specified complication [facility]) Ms. Nicolls is reminded of the importance of commitment to daily physical activity for 30 minutes or more, as able and the need to limit carbohydrate intake to 30 to 60 grams per meal to help with blood sugar control.   The need to take medication as prescribed, test blood sugar as directed, and to call between visits if there is a concern that blood sugar is uncontrolled is also discussed.   Ms. Maloney is reminded of the importance of daily foot exam, annual eye examination, and good blood sugar, blood pressure and cholesterol control.     Latest Ref Rng & Units 04/22/2022    9:52 AM 11/20/2021    9:59 AM 08/06/2021   11:27 AM 08/19/2020    3:51 PM 01/01/2020    2:22 PM  Diabetic Labs  HbA1c 4.8 - 5.6 % 6.7  6.4  7.2  6.7  6.5   Micro/Creat Ratio 0 - 29 mg/g creat  4      Chol 100 - 199 mg/dL 161  096  045     HDL >40 mg/dL  53  49  60     Calc LDL 0 - 99 mg/dL 92  147  829     Triglycerides 0 - 149 mg/dL 562  72  97     Creatinine 0.57 - 1.00 mg/dL 1.30  8.65  7.84         04/22/2022   10:19 AM 04/22/2022    9:32 AM 04/22/2022    9:02 AM 04/22/2022    8:59 AM 01/28/2022   10:35 AM 11/20/2021    9:43 AM 11/20/2021    9:22 AM  BP/Weight  Systolic BP 152 152 170 172 117 158 148  Diastolic BP 84 84 82 86 72 100 91  Wt. (Lbs) 241   241.04 230  244  BMI 42.69 kg/m2   42.7 kg/m2 40.74 kg/m2  43.22 kg/m2      Latest Ref Rng & Units 12/01/2021   12:00 AM 06/03/2015    1:30 PM  Foot/eye exam completion dates  Eye Exam No Retinopathy No Retinopathy       Foot Form Completion   Done     This result is from an external source.      Increase ozempic dose when next due to fill  Major depression in partial remission (HCC) Managed by Psych, doing well  Hyperlipidemia LDL goal  <100 Hyperlipidemia:Low fat diet discussed and encouraged.   Lipid Panel  Lab Results  Component Value Date   CHOL 165 04/22/2022   HDL 53 04/22/2022   LDLCALC 92 04/22/2022   TRIG 108 04/22/2022   CHOLHDL 3.1 04/22/2022     Controlled, no change in medication , no med change  Follow Up Instructions:    I discussed the assessment and treatment plan with the patient. The patient was provided an opportunity to ask questions and all were answered. The patient agreed with the plan and demonstrated an understanding of the instructions.   The patient was advised to call back or seek an in-person evaluation if the symptoms worsen or if the condition fails to improve as anticipated.  I provided 22 minutes of non-face-to-face time during this encounter.   Syliva Overman, MD

## 2022-06-08 NOTE — Assessment & Plan Note (Signed)
Tami Pearson is reminded of the importance of commitment to daily physical activity for 30 minutes or more, as able and the need to limit carbohydrate intake to 30 to 60 grams per meal to help with blood sugar control.   The need to take medication as prescribed, test blood sugar as directed, and to call between visits if there is a concern that blood sugar is uncontrolled is also discussed.   Tami Pearson is reminded of the importance of daily foot exam, annual eye examination, and good blood sugar, blood pressure and cholesterol control.     Latest Ref Rng & Units 04/22/2022    9:52 AM 11/20/2021    9:59 AM 08/06/2021   11:27 AM 08/19/2020    3:51 PM 01/01/2020    2:22 PM  Diabetic Labs  HbA1c 4.8 - 5.6 % 6.7  6.4  7.2  6.7  6.5   Micro/Creat Ratio 0 - 29 mg/g creat  4      Chol 100 - 199 mg/dL 846  962  952     HDL >84 mg/dL 53  49  60     Calc LDL 0 - 99 mg/dL 92  132  440     Triglycerides 0 - 149 mg/dL 102  72  97     Creatinine 0.57 - 1.00 mg/dL 7.25  3.66  4.40         04/22/2022   10:19 AM 04/22/2022    9:32 AM 04/22/2022    9:02 AM 04/22/2022    8:59 AM 01/28/2022   10:35 AM 11/20/2021    9:43 AM 11/20/2021    9:22 AM  BP/Weight  Systolic BP 152 152 170 172 117 158 148  Diastolic BP 84 84 82 86 72 100 91  Wt. (Lbs) 241   241.04 230  244  BMI 42.69 kg/m2   42.7 kg/m2 40.74 kg/m2  43.22 kg/m2      Latest Ref Rng & Units 12/01/2021   12:00 AM 06/03/2015    1:30 PM  Foot/eye exam completion dates  Eye Exam No Retinopathy No Retinopathy       Foot Form Completion   Done     This result is from an external source.      Increase ozempic dose when next due to fill

## 2022-06-08 NOTE — Assessment & Plan Note (Signed)
  Patient re-educated about  the importance of commitment to a  minimum of 150 minutes of exercise per week as able.  The importance of healthy food choices with portion control discussed, as well as eating regularly and within a 12 hour window most days. The need to choose "clean , green" food 50 to 75% of the time is discussed, as well as to make water the primary drink and set a goal of 64 ounces water daily.       04/22/2022   10:19 AM 04/22/2022    8:59 AM 01/28/2022   10:35 AM  Weight /BMI  Weight 241 lb 241 lb 0.6 oz 230 lb  Height 5\' 3"  (1.6 m) 5\' 3"  (1.6 m) 5\' 3"  (1.6 m)  BMI 42.69 kg/m2 42.7 kg/m2 40.74 kg/m2    Increase ozempic dose

## 2022-06-08 NOTE — Assessment & Plan Note (Signed)
Uncontrolled , needs in office re eval DASH diet and commitment to daily physical activity for a minimum of 30 minutes discussed and encouraged, as a part of hypertension management. The importance of attaining a healthy weight is also discussed.     04/22/2022   10:19 AM 04/22/2022    9:32 AM 04/22/2022    9:02 AM 04/22/2022    8:59 AM 01/28/2022   10:35 AM 11/20/2021    9:43 AM 11/20/2021    9:22 AM  BP/Weight  Systolic BP 152 152 170 172 117 158 148  Diastolic BP 84 84 82 86 72 100 91  Wt. (Lbs) 241   241.04 230  244  BMI 42.69 kg/m2   42.7 kg/m2 40.74 kg/m2  43.22 kg/m2

## 2022-06-08 NOTE — Assessment & Plan Note (Signed)
Hyperlipidemia:Low fat diet discussed and encouraged.   Lipid Panel  Lab Results  Component Value Date   CHOL 165 04/22/2022   HDL 53 04/22/2022   LDLCALC 92 04/22/2022   TRIG 108 04/22/2022   CHOLHDL 3.1 04/22/2022     Controlled, no change in medication , no med change

## 2022-06-08 NOTE — Assessment & Plan Note (Signed)
Managed by Psych, doing well

## 2022-06-08 NOTE — Patient Instructions (Addendum)
F/U in 4 to 5 weeks, re evaluate weight and blood pressure in office visit  Please bring your  medication to the visit  Thankful life is good, you are where you are supposed to be!   Dose increase in ozempic to 1 mg weekly when next due   Pls call and verify with your mail order pharmacy that next Ozempic  being sent is the new higher dose   Pls call [facility] to schedule your July mammogram

## 2022-06-14 ENCOUNTER — Ambulatory Visit (HOSPITAL_COMMUNITY): Payer: Medicare HMO

## 2022-06-21 ENCOUNTER — Ambulatory Visit: Payer: Medicare HMO | Admitting: Orthopedic Surgery

## 2022-07-12 ENCOUNTER — Ambulatory Visit (HOSPITAL_COMMUNITY): Admission: RE | Admit: 2022-07-12 | Payer: Medicare HMO | Source: Ambulatory Visit

## 2022-07-19 ENCOUNTER — Ambulatory Visit: Payer: Medicare HMO | Admitting: Orthopedic Surgery

## 2022-07-21 ENCOUNTER — Other Ambulatory Visit: Payer: Self-pay | Admitting: Orthopedic Surgery

## 2022-07-21 ENCOUNTER — Other Ambulatory Visit (HOSPITAL_COMMUNITY): Payer: Self-pay | Admitting: Psychiatry

## 2022-07-21 DIAGNOSIS — F5102 Adjustment insomnia: Secondary | ICD-10-CM

## 2022-07-21 NOTE — Telephone Encounter (Signed)
Call for appt

## 2022-07-29 ENCOUNTER — Encounter: Payer: Self-pay | Admitting: Family Medicine

## 2022-07-29 ENCOUNTER — Ambulatory Visit (INDEPENDENT_AMBULATORY_CARE_PROVIDER_SITE_OTHER): Payer: Medicare HMO | Admitting: Family Medicine

## 2022-07-29 ENCOUNTER — Other Ambulatory Visit: Payer: Self-pay | Admitting: Family Medicine

## 2022-07-29 VITALS — BP 124/82 | HR 84 | Ht 63.0 in | Wt 227.0 lb

## 2022-07-29 DIAGNOSIS — F3341 Major depressive disorder, recurrent, in partial remission: Secondary | ICD-10-CM | POA: Diagnosis not present

## 2022-07-29 DIAGNOSIS — I10 Essential (primary) hypertension: Secondary | ICD-10-CM | POA: Diagnosis not present

## 2022-07-29 DIAGNOSIS — M5410 Radiculopathy, site unspecified: Secondary | ICD-10-CM

## 2022-07-29 DIAGNOSIS — M549 Dorsalgia, unspecified: Secondary | ICD-10-CM | POA: Diagnosis not present

## 2022-07-29 DIAGNOSIS — E1169 Type 2 diabetes mellitus with other specified complication: Secondary | ICD-10-CM

## 2022-07-29 DIAGNOSIS — E785 Hyperlipidemia, unspecified: Secondary | ICD-10-CM

## 2022-07-29 MED ORDER — METHYLPREDNISOLONE ACETATE 80 MG/ML IJ SUSP
80.0000 mg | Freq: Once | INTRAMUSCULAR | Status: AC
Start: 2022-07-29 — End: 2022-07-29
  Administered 2022-07-29: 80 mg via INTRAMUSCULAR

## 2022-07-29 NOTE — Patient Instructions (Addendum)
Annual exam with Pap in 12 to 13 weeks, call if you need me sooner.  Congratulations on excellent weight loss and very good blood pressure.  Keep up the excellent good eating habits, work on ensuring you have protein healthy protein being beans or egg white as well as white meats at each meal.  Please schedule your mammogram this is due.( Pt will schedule herself)  Please get your shingles vaccines at the pharmacy as soon as possible.  Please plan to get the COVID-vaccine when this is available in the fall.  Fasting lipid, cmp and EGFr and HBA1C at next visit  You are being referred to our Therapist and I will let Dr Tenny Craw know your score  DepoMedrol 80 mg IM in office today for back pain  Thanks for choosing [facility], we consider it a privelige to serve you.

## 2022-07-30 MED ORDER — SEMAGLUTIDE (1 MG/DOSE) 4 MG/3ML ~~LOC~~ SOPN: 1.0000 mg | PEN_INJECTOR | SUBCUTANEOUS | 0 refills | Status: DC

## 2022-07-30 NOTE — Assessment & Plan Note (Signed)
  Patient re-educated about  the importance of commitment to a  minimum of 150 minutes of exercise per week as able.  The importance of healthy food choices with portion control discussed, as well as eating regularly and within a 12 hour window most days. The need to choose "clean , green" food 50 to 75% of the time is discussed, as well as to make water the primary drink and set a goal of 64 ounces water daily.       07/29/2022    9:15 AM 04/22/2022   10:19 AM 04/22/2022    8:59 AM  Weight /BMI  Weight 227 lb 0.6 oz 241 lb 241 lb 0.6 oz  Height 5\' 3"  (1.6 m) 5\' 3"  (1.6 m) 5\' 3"  (1.6 m)  BMI 40.22 kg/m2 42.69 kg/m2 42.7 kg/m2    Marked improvement , great no med change

## 2022-07-30 NOTE — Assessment & Plan Note (Signed)
Uncontrolled. depo medrol administered IM in the office , to be followed by a short course of oral prednisone  

## 2022-07-30 NOTE — Assessment & Plan Note (Signed)
Controlled, no change in medication DASH diet and commitment to daily physical activity for a minimum of 30 minutes discussed and encouraged, as a part of hypertension management. The importance of attaining a healthy weight is also discussed.     07/29/2022    9:15 AM 04/22/2022   10:19 AM 04/22/2022    9:32 AM 04/22/2022    9:02 AM 04/22/2022    8:59 AM 01/28/2022   10:35 AM 11/20/2021    9:43 AM  BP/Weight  Systolic BP 124 152 152 170 172 117 158  Diastolic BP 82 84 84 82 86 72 161  Wt. (Lbs) 227.04 241   241.04 230   BMI 40.22 kg/m2 42.69 kg/m2   42.7 kg/m2 40.74 kg/m2

## 2022-07-30 NOTE — Assessment & Plan Note (Signed)
Tami Pearson is reminded of the importance of commitment to daily physical activity for 30 minutes or more, as able and the need to limit carbohydrate intake to 30 to 60 grams per meal to help with blood sugar control.   The need to take medication as prescribed, test blood sugar as directed, and to call between visits if there is a concern that blood sugar is uncontrolled is also discussed.   Tami Pearson is reminded of the importance of daily foot exam, annual eye examination, and good blood sugar, blood pressure and cholesterol control.     Latest Ref Rng & Units 04/22/2022    9:52 AM 11/20/2021    9:59 AM 08/06/2021   11:27 AM 08/19/2020    3:51 PM 01/01/2020    2:22 PM  Diabetic Labs  HbA1c 4.8 - 5.6 % 6.7  6.4  7.2  6.7  6.5   Micro/Creat Ratio 0 - 29 mg/g creat  4      Chol 100 - 199 mg/dL 161  096  045     HDL >40 mg/dL 53  49  60     Calc LDL 0 - 99 mg/dL 92  981  191     Triglycerides 0 - 149 mg/dL 478  72  97     Creatinine 0.57 - 1.00 mg/dL 2.95  6.21  3.08         07/29/2022    9:15 AM 04/22/2022   10:19 AM 04/22/2022    9:32 AM 04/22/2022    9:02 AM 04/22/2022    8:59 AM 01/28/2022   10:35 AM 11/20/2021    9:43 AM  BP/Weight  Systolic BP 124 152 152 170 172 117 158  Diastolic BP 82 84 84 82 86 72 657  Wt. (Lbs) 227.04 241   241.04 230   BMI 40.22 kg/m2 42.69 kg/m2   42.7 kg/m2 40.74 kg/m2       Latest Ref Rng & Units 12/01/2021   12:00 AM 06/03/2015    1:30 PM  Foot/eye exam completion dates  Eye Exam No Retinopathy No Retinopathy       Foot Form Completion   Done     This result is from an external source.      Updated lab needed at/ before next visit.

## 2022-07-30 NOTE — Progress Notes (Signed)
Tami Pearson     MRN: [id number]      DOB: Jan 07, 1966  Chief Complaint  Patient presents with   Follow-up    Follow up     HPI Tami Pearson is here for follow up and re-evaluation of chronic medical conditions, medication management and review of any available recent lab and radiology data.  Preventive health is updated, specifically  Cancer screening and Immunization.   Questions or concerns regarding consultations or procedures which the PT has had in the interim are  addressed. The PT denies any adverse reactions to current medications since the last visit.  Feels better since losing weight  C/o uncontrolled depression , interested in therapy Patient and/or legal guardian verbally consented to [facility] services about presenting concerns and psychiatric consultation as appropriate.  The services will be billed as appropriate for the patient   ROS Denies recent fever or chills. Denies sinus pressure, nasal congestion, ear pain or sore throat. Denies chest congestion, productive cough or wheezing. Denies chest pains, palpitations and leg swelling Denies abdominal pain, nausea, vomiting,diarrhea or constipation.   Denies dysuria, frequency, hesitancy or incontinence. Denies joint pain, swelling and limitation in mobility. Denies headaches, seizures, numbness, or tingling. . Denies skin break down or rash.   PE  BP 124/82 (BP Location: Right Arm, Patient Position: Sitting, Cuff Size: Large)   Pulse 84   Ht 5\' 3"  (1.6 m)   Wt 227 lb 0.6 oz (103 kg)   LMP 03/18/2013   SpO2 97%   BMI 40.22 kg/m   Patient alert and oriented and in no cardiopulmonary distress.  HEENT: No facial asymmetry, EOMI,     Neck supple .  Chest: Clear to auscultation bilaterally.  CVS: S1, S2 no murmurs, no S3.Regular rate.  ABD: Soft non tender.   Ext: No edema  MS: Adequate though reduced  ROM spine, shoulders, hips and knees.  Skin: Intact, no ulcerations  or rash noted.  Psych: Good eye contact, normal affect. Memory intact not anxious at times mildly tearful and  depressed appearing.  CNS: CN 2-12 intact, power,  normal throughout.no focal deficits noted.   Assessment & Plan  Back pain with radiculopathy Uncontrolled. depo medrol administered IM in the office , to be followed by a short course of oral prednisone   Morbid obesity  Patient re-educated about  the importance of commitment to a  minimum of 150 minutes of exercise per week as able.  The importance of healthy food choices with portion control discussed, as well as eating regularly and within a 12 hour window most days. The need to choose "clean , green" food 50 to 75% of the time is discussed, as well as to make water the primary drink and set a goal of 64 ounces water daily.       07/29/2022    9:15 AM 04/22/2022   10:19 AM 04/22/2022    8:59 AM  Weight /BMI  Weight 227 lb 0.6 oz 241 lb 241 lb 0.6 oz  Height 5\' 3"  (1.6 m) 5\' 3"  (1.6 m) 5\' 3"  (1.6 m)  BMI 40.22 kg/m2 42.69 kg/m2 42.7 kg/m2    Marked improvement , great no med change  Type 2 diabetes mellitus with other specified complication [facility]) Tami Pearson is reminded of the importance of commitment to daily physical activity for 30 minutes or more, as able and the need to limit carbohydrate intake to 30 to 60 grams per meal to help with blood  sugar control.   The need to take medication as prescribed, test blood sugar as directed, and to call between visits if there is a concern that blood sugar is uncontrolled is also discussed.   Tami Pearson is reminded of the importance of daily foot exam, annual eye examination, and good blood sugar, blood pressure and cholesterol control.     Latest Ref Rng & Units 04/22/2022    9:52 AM 11/20/2021    9:59 AM 08/06/2021   11:27 AM 08/19/2020    3:51 PM 01/01/2020    2:22 PM  Diabetic Labs  HbA1c 4.8 - 5.6 % 6.7  6.4  7.2  6.7  6.5   Micro/Creat Ratio 0 - 29 mg/g creat  4       Chol 100 - 199 mg/dL 409  811  914     HDL >78 mg/dL 53  49  60     Calc LDL 0 - 99 mg/dL 92  295  621     Triglycerides 0 - 149 mg/dL 308  72  97     Creatinine 0.57 - 1.00 mg/dL 6.57  8.46  9.62         07/29/2022    9:15 AM 04/22/2022   10:19 AM 04/22/2022    9:32 AM 04/22/2022    9:02 AM 04/22/2022    8:59 AM 01/28/2022   10:35 AM 11/20/2021    9:43 AM  BP/Weight  Systolic BP 124 152 152 170 172 117 158  Diastolic BP 82 84 84 82 86 72 952  Wt. (Lbs) 227.04 241   241.04 230   BMI 40.22 kg/m2 42.69 kg/m2   42.7 kg/m2 40.74 kg/m2       Latest Ref Rng & Units 12/01/2021   12:00 AM 06/03/2015    1:30 PM  Foot/eye exam completion dates  Eye Exam No Retinopathy No Retinopathy       Foot Form Completion   Done     This result is from an external source.      Updated lab needed at/ before next visit.   Essential hypertension Controlled, no change in medication DASH diet and commitment to daily physical activity for a minimum of 30 minutes discussed and encouraged, as a part of hypertension management. The importance of attaining a healthy weight is also discussed.     07/29/2022    9:15 AM 04/22/2022   10:19 AM 04/22/2022    9:32 AM 04/22/2022    9:02 AM 04/22/2022    8:59 AM 01/28/2022   10:35 AM 11/20/2021    9:43 AM  BP/Weight  Systolic BP 124 152 152 170 172 117 158  Diastolic BP 82 84 84 82 86 72 841  Wt. (Lbs) 227.04 241   241.04 230   BMI 40.22 kg/m2 42.69 kg/m2   42.7 kg/m2 40.74 kg/m2        Major depression in partial remission (HCC) Inadequately treated , will refer to therapy and msg Psych, not suicidal or homicidal

## 2022-07-30 NOTE — Assessment & Plan Note (Signed)
Inadequately treated , will refer to therapy and msg Psych, not suicidal or homicidal

## 2022-08-02 ENCOUNTER — Encounter: Payer: Self-pay | Admitting: Internal Medicine

## 2022-08-02 ENCOUNTER — Ambulatory Visit (INDEPENDENT_AMBULATORY_CARE_PROVIDER_SITE_OTHER): Payer: Medicare HMO | Admitting: Internal Medicine

## 2022-08-02 VITALS — BP 117/72 | HR 80

## 2022-08-02 DIAGNOSIS — Z0000 Encounter for general adult medical examination without abnormal findings: Secondary | ICD-10-CM
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
## 2022-08-02 NOTE — Patient Instructions (Signed)
  Ms. Bute , Thank you for taking time to come for your Medicare Wellness Visit. I appreciate your ongoing commitment to your health goals. Please review the following plan we discussed and let me know if I can assist you in the future.   These are the goals we discussed:  Goals      DIET - REDUCE PORTION SIZE     Pt states she is doing well with the portion sizes.     DIET - REDUCE PORTION SIZE     Increase physical activity     Get on a schedule to walk 3 days a week for 20 mins  Pt states her hips and knees will not let her do like she wants to     Patient Stated     Weight lost, she is down 14 lbs. On Ozempic.  Goal of of normal BMI.         This is a list of the screening recommended for you and due dates:  Health Maintenance  Topic Date Due   COVID-19 Vaccine (1) Never done   Zoster (Shingles) Vaccine (1 of 2) Never done   Medicare Annual Wellness Visit  12/15/2021   Flu Shot  09/02/2022   DTaP/Tdap/Td vaccine (2 - Td or Tdap) 09/07/2022   Hemoglobin A1C  10/23/2022   Yearly kidney health urinalysis for diabetes  11/21/2022   Pap Smear  01/01/2023   Cologuard (Stool DNA test)  03/15/2023   Yearly kidney function blood test for diabetes  04/22/2023   Mammogram  08/25/2023   Hepatitis C Screening  Completed   HIV Screening  Completed   HPV Vaccine  Aged Out

## 2022-08-03 ENCOUNTER — Encounter (HOSPITAL_COMMUNITY): Payer: Self-pay | Admitting: Psychiatry

## 2022-08-03 ENCOUNTER — Telehealth (INDEPENDENT_AMBULATORY_CARE_PROVIDER_SITE_OTHER): Payer: Medicare HMO | Admitting: Psychiatry

## 2022-08-03 DIAGNOSIS — F3341 Major depressive disorder, recurrent, in partial remission: Secondary | ICD-10-CM | POA: Diagnosis not present

## 2022-08-03 DIAGNOSIS — F5102 Adjustment insomnia: Secondary | ICD-10-CM

## 2022-08-03 MED ORDER — TRAZODONE HCL 50 MG PO TABS
50.0000 mg | ORAL_TABLET | Freq: Every day | ORAL | 3 refills | Status: DC
Start: 2022-08-03 — End: 2023-02-14

## 2022-08-03 MED ORDER — DULOXETINE HCL 60 MG PO CPEP: 60.0000 mg | ORAL_CAPSULE | Freq: Two times a day (BID) | ORAL | 2 refills | Status: DC

## 2022-08-03 MED ORDER — BUPROPION HCL ER (XL) 150 MG PO TB24
150.0000 mg | ORAL_TABLET | ORAL | 1 refills | Status: DC
Start: 2022-08-03 — End: 2023-02-18

## 2022-08-03 MED ORDER — LORAZEPAM 0.5 MG PO TABS: 0.5000 mg | ORAL_TABLET | Freq: Every day | ORAL | 3 refills | Status: DC | PRN
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
## 2022-08-12 DIAGNOSIS — Z1231 Encounter for screening mammogram for malignant neoplasm of breast: Secondary | ICD-10-CM | POA: Diagnosis not present

## 2022-09-01 ENCOUNTER — Encounter: Payer: Self-pay | Admitting: Family Medicine

## 2022-09-23 ENCOUNTER — Institutional Professional Consult (permissible substitution): Payer: Medicare HMO | Admitting: Professional Counselor

## 2022-10-20 ENCOUNTER — Other Ambulatory Visit: Payer: Self-pay | Admitting: Family Medicine

## 2022-10-21 ENCOUNTER — Ambulatory Visit: Payer: Medicare HMO | Admitting: Family Medicine

## 2022-11-10 ENCOUNTER — Encounter (HOSPITAL_COMMUNITY): Payer: Self-pay | Admitting: Psychiatry

## 2022-11-10 ENCOUNTER — Telehealth (INDEPENDENT_AMBULATORY_CARE_PROVIDER_SITE_OTHER): Payer: Medicare HMO | Admitting: Psychiatry

## 2022-11-10 DIAGNOSIS — F3341 Major depressive disorder, recurrent, in partial remission: Secondary | ICD-10-CM | POA: Diagnosis not present

## 2022-11-10 DIAGNOSIS — F5102 Adjustment insomnia: Secondary | ICD-10-CM | POA: Diagnosis not present
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
## 2022-11-16 DIAGNOSIS — S40019A Contusion of unspecified shoulder, initial encounter: Secondary | ICD-10-CM | POA: Diagnosis not present

## 2022-11-16 DIAGNOSIS — S20211A Contusion of right front wall of thorax, initial encounter: Secondary | ICD-10-CM | POA: Diagnosis not present

## 2022-11-16 DIAGNOSIS — Z6841 Body Mass Index (BMI) 40.0 and over, adult: Secondary | ICD-10-CM | POA: Diagnosis not present

## 2022-11-16 DIAGNOSIS — R030 Elevated blood-pressure reading, without diagnosis of hypertension: Secondary | ICD-10-CM | POA: Diagnosis not present

## 2022-12-06 ENCOUNTER — Other Ambulatory Visit: Payer: Self-pay | Admitting: Family Medicine

## 2022-12-06 DIAGNOSIS — I10 Essential (primary) hypertension: Secondary | ICD-10-CM

## 2022-12-07 ENCOUNTER — Ambulatory Visit: Payer: Medicare HMO | Admitting: Family Medicine

## 2022-12-29 ENCOUNTER — Other Ambulatory Visit: Payer: Self-pay | Admitting: Family Medicine

## 2023-01-04 ENCOUNTER — Telehealth (INDEPENDENT_AMBULATORY_CARE_PROVIDER_SITE_OTHER): Payer: Medicare HMO | Admitting: Family Medicine

## 2023-01-04 ENCOUNTER — Encounter: Payer: Self-pay | Admitting: Family Medicine

## 2023-01-04 ENCOUNTER — Ambulatory Visit: Payer: Medicare HMO | Admitting: Family Medicine

## 2023-01-04 VITALS — BP 120/80 | Ht 63.0 in | Wt 219.0 lb

## 2023-01-04 DIAGNOSIS — E785 Hyperlipidemia, unspecified: Secondary | ICD-10-CM

## 2023-01-04 DIAGNOSIS — E1169 Type 2 diabetes mellitus with other specified complication: Secondary | ICD-10-CM | POA: Diagnosis not present

## 2023-01-04 DIAGNOSIS — F3341 Major depressive disorder, recurrent, in partial remission: Secondary | ICD-10-CM

## 2023-01-04 DIAGNOSIS — I10 Essential (primary) hypertension: Secondary | ICD-10-CM

## 2023-01-04 NOTE — Patient Instructions (Addendum)
In office annual exam in mid to end January, needs IN OFFICE visit , call if you need me soonert  Labs are already ordered , come next week Tuesday morning as discussed  Nurse pls add urine aCR  to  lab order  Nurse visit next Tuesday am fo flu vaccine  pls pt plans to come a 8 am , let her know if that is feasible please  Thanks for choosing [facility], we consider it a privelige to serve you.

## 2023-01-04 NOTE — Assessment & Plan Note (Signed)
Hyperlipidemia:Low fat diet discussed and encouraged.   Lipid Panel  Lab Results  Component Value Date   CHOL 165 04/22/2022   HDL 53 04/22/2022   LDLCALC 92 04/22/2022   TRIG 108 04/22/2022   CHOLHDL 3.1 04/22/2022     Updated lab needed at/ before next visit.

## 2023-01-04 NOTE — Progress Notes (Signed)
Virtual Visit via Video Note  I connected with Tami Pearson on 01/04/23 at  8:40 AM EST by a video enabled telemedicine application and verified that I am speaking with the correct person using two identifiers.  Location: Patient: home Provider: office   I discussed the limitations of evaluation and management by telemedicine and the availability of in person appointments. The patient expressed understanding and agreed to proceed.  History of Present Illness:   Routine follow up, no concerns, behin d in vaccines and labs Losing weight reportedly States doing well overall  Observations/Objective: BP 120/80   Ht 5\' 3"  (1.6 m)   Wt 219 lb (99.3 kg)   LMP 03/18/2013   BMI 38.79 kg/m  Good communication with no confusion and intact memory. Alert and oriented x 3 No signs of respiratory distress during speech   Assessment and Plan: Hyperlipidemia LDL goal <100 Hyperlipidemia:Low fat diet discussed and encouraged.   Lipid Panel  Lab Results  Component Value Date   CHOL 165 04/22/2022   HDL 53 04/22/2022   LDLCALC 92 04/22/2022   TRIG 108 04/22/2022   CHOLHDL 3.1 04/22/2022     Updated lab needed at/ before next visit.   Diabetes mellitus [facility]) Tami Pearson is reminded of the importance of commitment to daily physical activity for 30 minutes or more, as able and the need to limit carbohydrate intake to 30 to 60 grams per meal to help with blood sugar control.   The need to take medication as prescribed, test blood sugar as directed, and to call between visits if there is a concern that blood sugar is uncontrolled is also discussed.   Tami Pearson is reminded of the importance of daily foot exam, annual eye examination, and good blood sugar, blood pressure and cholesterol control.     Latest Ref Rng & Units 04/22/2022    9:52 AM 11/20/2021    9:59 AM 08/06/2021   11:27 AM 08/19/2020    3:51 PM 01/01/2020    2:22 PM  Diabetic Labs  HbA1c 4.8 - 5.6 % 6.7  6.4  7.2   6.7  6.5   Micro/Creat Ratio 0 - 29 mg/g creat  4      Chol 100 - 199 mg/dL 161  096  045     HDL >40 mg/dL 53  49  60     Calc LDL 0 - 99 mg/dL 92  981  191     Triglycerides 0 - 149 mg/dL 478  72  97     Creatinine 0.57 - 1.00 mg/dL 2.95  6.21  3.08         01/04/2023    8:36 AM 08/02/2022    2:00 PM 07/29/2022    9:15 AM 04/22/2022   10:19 AM 04/22/2022    9:32 AM 04/22/2022    9:02 AM 04/22/2022    8:59 AM  BP/Weight  Systolic BP 120 117 124 152 152 170 172  Diastolic BP 80 72 82 84 84 82 86  Wt. (Lbs) 219  227.04 241   241.04  BMI 38.79 kg/m2  40.22 kg/m2 42.69 kg/m2   42.7 kg/m2      Latest Ref Rng & Units 12/01/2021   12:00 AM 06/03/2015    1:30 PM  Foot/eye exam completion dates  Eye Exam No Retinopathy No Retinopathy       Foot Form Completion   Done     This result is from an external source.  Updated lab past due , will get in next 1 week  Essential hypertension DASH diet and commitment to daily physical activity for a minimum of 30 minutes discussed and encouraged, as a part of hypertension management. The importance of attaining a healthy weight is also discussed.     01/04/2023    8:36 AM 08/02/2022    2:00 PM 07/29/2022    9:15 AM 04/22/2022   10:19 AM 04/22/2022    9:32 AM 04/22/2022    9:02 AM 04/22/2022    8:59 AM  BP/Weight  Systolic BP 120 117 124 152 152 170 172  Diastolic BP 80 72 82 84 84 82 86  Wt. (Lbs) 219  227.04 241   241.04  BMI 38.79 kg/m2  40.22 kg/m2 42.69 kg/m2   42.7 kg/m2     Controlled per home bP reading reported, needs in office visit  Type 2 diabetes mellitus with other specified complication [facility]) Tami Pearson is reminded of the importance of commitment to daily physical activity for 30 minutes or more, as able and the need to limit carbohydrate intake to 30 to 60 grams per meal to help with blood sugar control.  Diabetes caudsing hypertension, hyperlipidemia, arthritis and depession  The need to take medication as prescribed,  test blood sugar as directed, and to call between visits if there is a concern that blood sugar is uncontrolled is also discussed.   Tami Pearson is reminded of the importance of daily foot exam, annual eye examination, and good blood sugar, blood pressure and cholesterol control.     Latest Ref Rng & Units 04/22/2022    9:52 AM 11/20/2021    9:59 AM 08/06/2021   11:27 AM 08/19/2020    3:51 PM 01/01/2020    2:22 PM  Diabetic Labs  HbA1c 4.8 - 5.6 % 6.7  6.4  7.2  6.7  6.5   Micro/Creat Ratio 0 - 29 mg/g creat  4      Chol 100 - 199 mg/dL 696  295  284     HDL >13 mg/dL 53  49  60     Calc LDL 0 - 99 mg/dL 92  244  010     Triglycerides 0 - 149 mg/dL 272  72  97     Creatinine 0.57 - 1.00 mg/dL 5.36  6.44  0.34         01/04/2023    8:36 AM 08/02/2022    2:00 PM 07/29/2022    9:15 AM 04/22/2022   10:19 AM 04/22/2022    9:32 AM 04/22/2022    9:02 AM 04/22/2022    8:59 AM  BP/Weight  Systolic BP 120 117 124 152 152 170 172  Diastolic BP 80 72 82 84 84 82 86  Wt. (Lbs) 219  227.04 241   241.04  BMI 38.79 kg/m2  40.22 kg/m2 42.69 kg/m2   42.7 kg/m2      Latest Ref Rng & Units 12/01/2021   12:00 AM 06/03/2015    1:30 PM  Foot/eye exam completion dates  Eye Exam No Retinopathy No Retinopathy       Foot Form Completion   Done     This result is from an external source.      Updated lab past due  Morbid obesity  Patient re-educated about  the importance of commitment to a  minimum of 150 minutes of exercise per week as able.  The importance of healthy food choices with portion control discussed, as well  as eating regularly and within a 12 hour window most days. The need to choose "clean , green" food 50 to 75% of the time is discussed, as well as to make water the primary drink and set a goal of 64 ounces water daily.       01/04/2023    8:36 AM 07/29/2022    9:15 AM 04/22/2022   10:19 AM  Weight /BMI  Weight 219 lb 227 lb 0.6 oz 241 lb  Height 5\' 3"  (1.6 m) 5\' 3"  (1.6 m) 5\' 3"   (1.6 m)  BMI 38.79 kg/m2 40.22 kg/m2 42.69 kg/m2      Major depression in partial remission (HCC) Managed by psychiatry and reports doing well   Follow Up Instructions:    I discussed the assessment and treatment plan with the patient. The patient was provided an opportunity to ask questions and all were answered. The patient agreed with the plan and demonstrated an understanding of the instructions.   The patient was advised to call back or seek an in-person evaluation if the symptoms worsen or if the condition fails to improve as anticipated.  I provided 12 minutes of non-face-to-face time during this encounter.   Syliva Overman, MD

## 2023-01-04 NOTE — Assessment & Plan Note (Addendum)
Tami Pearson is reminded of the importance of commitment to daily physical activity for 30 minutes or more, as able and the need to limit carbohydrate intake to 30 to 60 grams per meal to help with blood sugar control.   The need to take medication as prescribed, test blood sugar as directed, and to call between visits if there is a concern that blood sugar is uncontrolled is also discussed.   Tami Pearson is reminded of the importance of daily foot exam, annual eye examination, and good blood sugar, blood pressure and cholesterol control.     Latest Ref Rng & Units 04/22/2022    9:52 AM 11/20/2021    9:59 AM 08/06/2021   11:27 AM 08/19/2020    3:51 PM 01/01/2020    2:22 PM  Diabetic Labs  HbA1c 4.8 - 5.6 % 6.7  6.4  7.2  6.7  6.5   Micro/Creat Ratio 0 - 29 mg/g creat  4      Chol 100 - 199 mg/dL 409  811  914     HDL >78 mg/dL 53  49  60     Calc LDL 0 - 99 mg/dL 92  295  621     Triglycerides 0 - 149 mg/dL 308  72  97     Creatinine 0.57 - 1.00 mg/dL 6.57  8.46  9.62         01/04/2023    8:36 AM 08/02/2022    2:00 PM 07/29/2022    9:15 AM 04/22/2022   10:19 AM 04/22/2022    9:32 AM 04/22/2022    9:02 AM 04/22/2022    8:59 AM  BP/Weight  Systolic BP 120 117 124 152 152 170 172  Diastolic BP 80 72 82 84 84 82 86  Wt. (Lbs) 219  227.04 241   241.04  BMI 38.79 kg/m2  40.22 kg/m2 42.69 kg/m2   42.7 kg/m2      Latest Ref Rng & Units 12/01/2021   12:00 AM 06/03/2015    1:30 PM  Foot/eye exam completion dates  Eye Exam No Retinopathy No Retinopathy       Foot Form Completion   Done     This result is from an external source.       Updated lab past due , will get in next 1 week

## 2023-01-07 MED ORDER — OZEMPIC (1 MG/DOSE) 4 MG/3ML ~~LOC~~ SOPN
1.0000 mg | PEN_INJECTOR | SUBCUTANEOUS | 0 refills | Status: AC
Start: 2023-01-07 — End: 2023-04-01

## 2023-01-07 NOTE — Assessment & Plan Note (Addendum)
Tami Pearson is reminded of the importance of commitment to daily physical activity for 30 minutes or more, as able and the need to limit carbohydrate intake to 30 to 60 grams per meal to help with blood sugar control.  Diabetes caudsing hypertension, hyperlipidemia, arthritis and depession  The need to take medication as prescribed, test blood sugar as directed, and to call between visits if there is a concern that blood sugar is uncontrolled is also discussed.   Tami Pearson is reminded of the importance of daily foot exam, annual eye examination, and good blood sugar, blood pressure and cholesterol control.     Latest Ref Rng & Units 04/22/2022    9:52 AM 11/20/2021    9:59 AM 08/06/2021   11:27 AM 08/19/2020    3:51 PM 01/01/2020    2:22 PM  Diabetic Labs  HbA1c 4.8 - 5.6 % 6.7  6.4  7.2  6.7  6.5   Micro/Creat Ratio 0 - 29 mg/g creat  4      Chol 100 - 199 mg/dL 347  425  956     HDL >38 mg/dL 53  49  60     Calc LDL 0 - 99 mg/dL 92  756  433     Triglycerides 0 - 149 mg/dL 295  72  97     Creatinine 0.57 - 1.00 mg/dL 1.88  4.16  6.06         01/04/2023    8:36 AM 08/02/2022    2:00 PM 07/29/2022    9:15 AM 04/22/2022   10:19 AM 04/22/2022    9:32 AM 04/22/2022    9:02 AM 04/22/2022    8:59 AM  BP/Weight  Systolic BP 120 117 124 152 152 170 172  Diastolic BP 80 72 82 84 84 82 86  Wt. (Lbs) 219  227.04 241   241.04  BMI 38.79 kg/m2  40.22 kg/m2 42.69 kg/m2   42.7 kg/m2      Latest Ref Rng & Units 12/01/2021   12:00 AM 06/03/2015    1:30 PM  Foot/eye exam completion dates  Eye Exam No Retinopathy No Retinopathy       Foot Form Completion   Done     This result is from an external source.      Updated lab past due

## 2023-01-07 NOTE — Assessment & Plan Note (Signed)
  Patient re-educated about  the importance of commitment to a  minimum of 150 minutes of exercise per week as able.  The importance of healthy food choices with portion control discussed, as well as eating regularly and within a 12 hour window most days. The need to choose "clean , green" food 50 to 75% of the time is discussed, as well as to make water the primary drink and set a goal of 64 ounces water daily.       01/04/2023    8:36 AM 07/29/2022    9:15 AM 04/22/2022   10:19 AM  Weight /BMI  Weight 219 lb 227 lb 0.6 oz 241 lb  Height 5\' 3"  (1.6 m) 5\' 3"  (1.6 m) 5\' 3"  (1.6 m)  BMI 38.79 kg/m2 40.22 kg/m2 42.69 kg/m2

## 2023-01-07 NOTE — Assessment & Plan Note (Signed)
Managed by psychiatry and reports doing well

## 2023-01-07 NOTE — Assessment & Plan Note (Signed)
DASH diet and commitment to daily physical activity for a minimum of 30 minutes discussed and encouraged, as a part of hypertension management. The importance of attaining a healthy weight is also discussed.     01/04/2023    8:36 AM 08/02/2022    2:00 PM 07/29/2022    9:15 AM 04/22/2022   10:19 AM 04/22/2022    9:32 AM 04/22/2022    9:02 AM 04/22/2022    8:59 AM  BP/Weight  Systolic BP 120 117 124 152 152 170 172  Diastolic BP 80 72 82 84 84 82 86  Wt. (Lbs) 219  227.04 241   241.04  BMI 38.79 kg/m2  40.22 kg/m2 42.69 kg/m2   42.7 kg/m2     Controlled per home bP reading reported, needs in office visit

## 2023-01-11 ENCOUNTER — Ambulatory Visit (INDEPENDENT_AMBULATORY_CARE_PROVIDER_SITE_OTHER): Payer: Medicare HMO

## 2023-01-11 DIAGNOSIS — Z23 Encounter for immunization: Secondary | ICD-10-CM | POA: Diagnosis not present

## 2023-01-11 DIAGNOSIS — I10 Essential (primary) hypertension: Secondary | ICD-10-CM | POA: Diagnosis not present

## 2023-01-11 DIAGNOSIS — E785 Hyperlipidemia, unspecified: Secondary | ICD-10-CM | POA: Diagnosis not present

## 2023-01-11 DIAGNOSIS — E1169 Type 2 diabetes mellitus with other specified complication: Secondary | ICD-10-CM | POA: Diagnosis not present

## 2023-01-12 LAB — CMP14+EGFR
ALT: 10 [IU]/L (ref 0–32)
AST: 16 [IU]/L (ref 0–40)
Albumin: 4 g/dL (ref 3.8–4.9)
Alkaline Phosphatase: 64 [IU]/L (ref 44–121)
BUN/Creatinine Ratio: 22 (ref 9–23)
BUN: 22 mg/dL (ref 6–24)
Bilirubin Total: 0.2 mg/dL (ref 0.0–1.2)
CO2: 22 mmol/L (ref 20–29)
Calcium: 9.5 mg/dL (ref 8.7–10.2)
Chloride: 103 mmol/L (ref 96–106)
Creatinine, Ser: 1.02 mg/dL — ABNORMAL HIGH (ref 0.57–1.00)
Globulin, Total: 3.7 g/dL (ref 1.5–4.5)
Glucose: 85 mg/dL (ref 70–99)
Potassium: 4.5 mmol/L (ref 3.5–5.2)
Sodium: 141 mmol/L (ref 134–144)
Total Protein: 7.7 g/dL (ref 6.0–8.5)
eGFR: 64 mL/min/{1.73_m2} (ref >59)

## 2023-01-12 LAB — LIPID PANEL
Chol/HDL Ratio: 3.1 {ratio} (ref 0.0–4.4)
Cholesterol, Total: 141 mg/dL (ref 100–199)
HDL: 45 mg/dL (ref >39)
LDL Chol Calc (NIH): 82 mg/dL (ref 0–99)
Triglycerides: 67 mg/dL (ref 0–149)
VLDL Cholesterol Cal: 14 mg/dL (ref 5–40)

## 2023-01-12 LAB — HEMOGLOBIN A1C
Est. average glucose Bld gHb Est-mCnc: 137 mg/dL
Hgb A1c MFr Bld: 6.4 % — ABNORMAL HIGH (ref 4.8–5.6)

## 2023-01-14 LAB — MICROALBUMIN / CREATININE URINE RATIO
Creatinine, Urine: 118.2 mg/dL
Microalb/Creat Ratio: <3 mg/g{creat} (ref 0–29)
Microalbumin, Urine: <3 ug/mL

## 2023-02-02 ENCOUNTER — Other Ambulatory Visit: Payer: Self-pay | Admitting: Family Medicine

## 2023-02-12 ENCOUNTER — Other Ambulatory Visit (HOSPITAL_COMMUNITY): Payer: Self-pay | Admitting: Psychiatry

## 2023-02-12 DIAGNOSIS — F5102 Adjustment insomnia: Secondary | ICD-10-CM

## 2023-02-14 NOTE — Telephone Encounter (Signed)
 Call for appt

## 2023-02-15 NOTE — Telephone Encounter (Signed)
 Pt scheduled 03/02/23.

## 2023-02-18 ENCOUNTER — Other Ambulatory Visit (HOSPITAL_COMMUNITY): Payer: Self-pay | Admitting: Psychiatry

## 2023-02-18 DIAGNOSIS — F3341 Major depressive disorder, recurrent, in partial remission: Secondary | ICD-10-CM

## 2023-03-01 ENCOUNTER — Ambulatory Visit: Payer: Self-pay | Admitting: Family Medicine

## 2023-03-02 ENCOUNTER — Telehealth (HOSPITAL_COMMUNITY): Payer: Medicare HMO | Admitting: Psychiatry

## 2023-03-02 ENCOUNTER — Encounter (HOSPITAL_COMMUNITY): Payer: Self-pay | Admitting: Psychiatry

## 2023-03-02 DIAGNOSIS — F5102 Adjustment insomnia: Secondary | ICD-10-CM | POA: Diagnosis not present

## 2023-03-02 DIAGNOSIS — F3341 Major depressive disorder, recurrent, in partial remission: Secondary | ICD-10-CM

## 2023-03-02 MED ORDER — BUPROPION HCL ER (XL) 150 MG PO TB24: 150.0000 mg | ORAL_TABLET | Freq: Every morning | ORAL | 3 refills | Status: DC

## 2023-03-02 MED ORDER — LORAZEPAM 0.5 MG PO TABS: 0.5000 mg | ORAL_TABLET | Freq: Every day | ORAL | 3 refills | Status: DC | PRN

## 2023-03-02 MED ORDER — DULOXETINE HCL 60 MG PO CPEP: 60.0000 mg | ORAL_CAPSULE | Freq: Two times a day (BID) | ORAL | 2 refills | Status: DC

## 2023-03-02 MED ORDER — TRAZODONE HCL 50 MG PO TABS: 50.0000 mg | ORAL_TABLET | Freq: Every day | ORAL | 3 refills | Status: DC
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
## 2023-03-31 ENCOUNTER — Ambulatory Visit: Payer: Self-pay | Admitting: Family Medicine

## 2023-04-06 ENCOUNTER — Other Ambulatory Visit: Payer: Self-pay | Admitting: Family Medicine

## 2023-04-27 ENCOUNTER — Encounter: Payer: Self-pay | Admitting: Family Medicine

## 2023-05-11 ENCOUNTER — Ambulatory Visit: Payer: Self-pay | Admitting: Family Medicine

## 2023-05-11 VITALS — BP 160/90 | HR 72 | Temp 97.3°F | Resp 18 | Ht 63.0 in | Wt 234.0 lb

## 2023-05-11 DIAGNOSIS — E785 Hyperlipidemia, unspecified: Secondary | ICD-10-CM

## 2023-05-11 DIAGNOSIS — I10 Essential (primary) hypertension: Secondary | ICD-10-CM

## 2023-05-11 DIAGNOSIS — U071 COVID-19: Secondary | ICD-10-CM | POA: Diagnosis not present

## 2023-05-11 DIAGNOSIS — Z20822 Contact with and (suspected) exposure to covid-19: Secondary | ICD-10-CM | POA: Diagnosis not present

## 2023-05-11 DIAGNOSIS — E1169 Type 2 diabetes mellitus with other specified complication: Secondary | ICD-10-CM | POA: Diagnosis not present

## 2023-05-11 DIAGNOSIS — E559 Vitamin D deficiency, unspecified: Secondary | ICD-10-CM | POA: Diagnosis not present

## 2023-05-11 MED ORDER — CARVEDILOL 3.125 MG PO TABS: 3.1250 mg | ORAL_TABLET | Freq: Two times a day (BID) | ORAL | 2 refills | Status: DC

## 2023-05-11 NOTE — Patient Instructions (Addendum)
 Annual exam mid to end May , call if you need me sooner  Swab today for covid, if positive you will be prescribed paxlovid , we will be in touch in the next 48 hours  Labs today CBC and diff, cmp and EGFr, TSH, Vit D and HBA1C ( nurse pls order)  New additional medication for blood pressure which is high, carvedilol 3.125 mg twice daily, take 12 hours apart , example 7 am and 7 pm SENT TO [facility] IN [place] , NEED TO START TODAY Continue current blood pressure medication  Nurse pls give pt tylenol 500 mg tablet aat visit for body aches  kEEP WELL HYDRATED AND GET SUFFICIENT REST  Thanks for choosing [facility], we consider it a privelige to serve you.

## 2023-05-12 ENCOUNTER — Encounter: Payer: Self-pay | Admitting: Family Medicine

## 2023-05-12 ENCOUNTER — Other Ambulatory Visit: Payer: Self-pay | Admitting: Orthopedic Surgery

## 2023-05-12 DIAGNOSIS — M5020 Other cervical disc displacement, unspecified cervical region: Secondary | ICD-10-CM

## 2023-05-12 LAB — CMP14+EGFR
ALT: 16 IU/L (ref 0–32)
AST: 17 IU/L (ref 0–40)
Albumin: 4 g/dL (ref 3.8–4.9)
Alkaline Phosphatase: 82 IU/L (ref 44–121)
BUN/Creatinine Ratio: 32 — ABNORMAL HIGH (ref 9–23)
BUN: 23 mg/dL (ref 6–24)
Bilirubin Total: <0.2 mg/dL (ref 0.0–1.2)
CO2: 20 mmol/L (ref 20–29)
Calcium: 9.6 mg/dL (ref 8.7–10.2)
Chloride: 104 mmol/L (ref 96–106)
Creatinine, Ser: 0.72 mg/dL (ref 0.57–1.00)
Globulin, Total: 3.6 g/dL (ref 1.5–4.5)
Glucose: 123 mg/dL — ABNORMAL HIGH (ref 70–99)
Potassium: 4 mmol/L (ref 3.5–5.2)
Sodium: 141 mmol/L (ref 134–144)
Total Protein: 7.6 g/dL (ref 6.0–8.5)
eGFR: 97 mL/min/{1.73_m2} (ref >59)

## 2023-05-12 LAB — CBC WITH DIFFERENTIAL/PLATELET
Basophils Absolute: 0.1 10*3/uL (ref 0.0–0.2)
Basos: 1 %
EOS (ABSOLUTE): 0.1 10*3/uL (ref 0.0–0.4)
Eos: 2 %
Hematocrit: 39.5 % (ref 34.0–46.6)
Hemoglobin: 13 g/dL (ref 11.1–15.9)
Immature Grans (Abs): 0 10*3/uL (ref 0.0–0.1)
Immature Granulocytes: 0 %
Lymphocytes Absolute: 2.4 10*3/uL (ref 0.7–3.1)
Lymphs: 36 %
MCH: 29.4 pg (ref 26.6–33.0)
MCHC: 32.9 g/dL (ref 31.5–35.7)
MCV: 89 fL (ref 79–97)
Monocytes Absolute: 0.6 10*3/uL (ref 0.1–0.9)
Monocytes: 8 %
Neutrophils Absolute: 3.4 10*3/uL (ref 1.4–7.0)
Neutrophils: 53 %
Platelets: 327 10*3/uL (ref 150–450)
RBC: 4.42 x10E6/uL (ref 3.77–5.28)
RDW: 13.3 % (ref 11.7–15.4)
WBC: 6.6 10*3/uL (ref 3.4–10.8)

## 2023-05-12 LAB — TSH: TSH: 1.36 u[IU]/mL (ref 0.450–4.500)

## 2023-05-12 LAB — VITAMIN D 25 HYDROXY (VIT D DEFICIENCY, FRACTURES): Vit D, 25-Hydroxy: 19.2 ng/mL — ABNORMAL LOW (ref 30.0–100.0)

## 2023-05-12 LAB — HEMOGLOBIN A1C
Est. average glucose Bld gHb Est-mCnc: 140 mg/dL
Hgb A1c MFr Bld: 6.5 % — ABNORMAL HIGH (ref 4.8–5.6)

## 2023-05-13 ENCOUNTER — Encounter: Payer: Self-pay | Admitting: Family Medicine

## 2023-05-13 ENCOUNTER — Other Ambulatory Visit: Payer: Self-pay | Admitting: Family Medicine

## 2023-05-13 DIAGNOSIS — Z20822 Contact with and (suspected) exposure to covid-19: Secondary | ICD-10-CM | POA: Insufficient documentation

## 2023-05-13 LAB — NOVEL CORONAVIRUS, NAA: SARS-CoV-2, NAA: DETECTED — AB

## 2023-05-13 MED ORDER — NIRMATRELVIR/RITONAVIR (PAXLOVID)TABLET: 3.0000 | ORAL_TABLET | Freq: Two times a day (BID) | ORAL | 0 refills | Status: AC

## 2023-05-13 NOTE — Progress Notes (Addendum)
 Tami Pearson     MRN: [id number]      DOB: [birth year]  Chief Complaint  Patient presents with   Follow-up    Follow up. Discuss going back on Phentermine . States ozempic  upsets her stomach   Cough    complains of body aches, chills, 105 fever and headache x3 days. Exposure to covid. Taking Tylenol      HPI Tami Pearson is here with above concerns, originally scheduled for annual exam but currently acutely ill, with s/s of covid following exposure, her son who lives with her has covid, she was hospitalized with covid several years ago Wants to resume phentermine  for weight loss but BP is uncontrolled  ROS Denies chest pains, palpitations and leg swelling Denies abdominal pain, nausea, vomiting,diarrhea or constipation.   Denies dysuria, frequency, hesitancy or incontinence.  Denies depression, anxiety or insomnia. Denies skin break down or rash.   PE  BP (!) 160/90   Pulse 72   Temp (!) 97.3 F (36.3 C)   Resp 18   Ht 5\' 3"  (1.6 m)   Wt 234 lb (106.1 kg)   LMP 03/18/2013   SpO2 96%   BMI 41.45 kg/m   Patient acutely ill appearing   HEENT: No facial asymmetry, EOMI,     Neck supple .  Chest: Clear to auscultation bilaterally.  CVS: S1, S2 no murmurs, no S3.Regular rate.   Ext: No edema  Skin: Intact, no ulcerations or rash noted.  Psych: Good eye contact, normal affect.  anxious not  depressed appearing.  CNS: CN 2-12 intact, power,  normal throughout.no focal deficits noted.   Assessment & Plan Close exposure to COVID-19 virus Acutely ill s/s highly suggestive of covid infection, will treat if positive, hydration, temp and symptom control and rest advised  COVID-19 Positive result paxlovid  prescribed on 05/13/2023  Essential hypertension Uncontrolled add coreg  DASH diet and commitment to daily physical activity for a minimum of 30 minutes discussed and encouraged, as a part of hypertension management. The importance of attaining a healthy weight is  also discussed.     05/11/2023    2:03 PM 05/11/2023    1:27 PM 01/04/2023    8:36 AM 08/02/2022    2:00 PM 07/29/2022    9:15 AM 04/22/2022   10:19 AM 04/22/2022    9:32 AM  BP/Weight  Systolic BP 160 163 120 117 124 152 152  Diastolic BP 90 90 80 72 82 84 84  Wt. (Lbs)  234 219  227.04 241   BMI  41.45 kg/m2 38.79 kg/m2  40.22 kg/m2 42.69 kg/m2     '   Hyperlipidemia LDL goal <100 Hyperlipidemia:Low fat diet discussed and encouraged.   Lipid Panel  Lab Results  Component Value Date   CHOL 141 01/11/2023   HDL 45 01/11/2023   LDLCALC 82 01/11/2023   TRIG 67 01/11/2023   CHOLHDL 3.1 01/11/2023       Type 2 diabetes mellitus with other specified complication (HCC) Diabetes associated with hypertension, hyperlipidemia, obesity, and arthritis  Tami Pearson is reminded of the importance of commitment to daily physical activity for 30 minutes or more, as able and the need to limit carbohydrate intake to 30 to 60 grams per meal to help with blood sugar control.   The need to take medication as prescribed, test blood sugar as directed, and to call between visits if there is a concern that blood sugar is uncontrolled is also discussed.   Tami Pearson is reminded of  the importance of daily foot exam, annual eye examination, and good blood sugar, blood pressure and cholesterol control.     Latest Ref Rng & Units 05/11/2023    2:35 PM 01/11/2023   10:00 AM 01/11/2023    8:58 AM 04/22/2022    9:52 AM 11/20/2021    9:59 AM  Diabetic Labs  HbA1c 4.8 - 5.6 % 6.5   6.4  6.7  6.4   Micro/Creat Ratio 0 - 29 mg/g creat  <3    4   Chol 100 - 199 mg/dL   604  540  981   HDL >19 mg/dL   45  53  49   Calc LDL 0 - 99 mg/dL   82  92  147   Triglycerides 0 - 149 mg/dL   67  829  72   Creatinine 0.57 - 1.00 mg/dL 5.62   1.30  8.65  7.84       05/11/2023    2:03 PM 05/11/2023    1:27 PM 01/04/2023    8:36 AM 08/02/2022    2:00 PM 07/29/2022    9:15 AM 04/22/2022   10:19 AM 04/22/2022    9:32 AM   BP/Weight  Systolic BP 160 163 120 117 124 152 152  Diastolic BP 90 90 80 72 82 84 84  Wt. (Lbs)  234 219  227.04 241   BMI  41.45 kg/m2 38.79 kg/m2  40.22 kg/m2 42.69 kg/m2       Latest Ref Rng & Units 12/01/2021   12:00 AM 06/03/2015    1:30 PM  Foot/eye exam completion dates  Eye Exam No Retinopathy No Retinopathy       Foot Form Completion   Done     This result is from an external source.        Vitamin D  deficiency Uncorrected needs to commit to weekly vit D

## 2023-05-13 NOTE — Assessment & Plan Note (Signed)
 Acutely ill s/s highly suggestive of covid infection, will treat if positive, hydration, temp and symptom control and rest advised

## 2023-05-13 NOTE — Assessment & Plan Note (Signed)
 Uncorrected needs to commit to weekly vit D

## 2023-05-13 NOTE — Assessment & Plan Note (Signed)
 Positive result paxlovid prescribed on 05/13/2023

## 2023-05-13 NOTE — Assessment & Plan Note (Signed)
 Uncontrolled add coreg DASH diet and commitment to daily physical activity for a minimum of 30 minutes discussed and encouraged, as a part of hypertension management. The importance of attaining a healthy weight is also discussed.     05/11/2023    2:03 PM 05/11/2023    1:27 PM 01/04/2023    8:36 AM 08/02/2022    2:00 PM 07/29/2022    9:15 AM 04/22/2022   10:19 AM 04/22/2022    9:32 AM  BP/Weight  Systolic BP 160 163 120 117 124 152 152  Diastolic BP 90 90 80 72 82 84 84  Wt. (Lbs)  234 219  227.04 241   BMI  41.45 kg/m2 38.79 kg/m2  40.22 kg/m2 42.69 kg/m2     '

## 2023-05-13 NOTE — Telephone Encounter (Signed)
 Copied from CRM [id number]. Topic: Clinical - Medication Refill >> May 13, 2023  8:05 AM Lebron Quam wrote: Most Recent Primary Care Visit:  Provider: Syliva Overman E  Department: [facility]  Visit Type: OFFICE VISIT  Date: 05/11/2023  Medication: nirmatrelvir/ritonavir (PAXLOVID) 20 x 150 MG & 10 x 100MG  TABS, Vit D 25 hydroxy (rtn osteoporosis monitoring)  Has the patient contacted their pharmacy? Yes (Agent: If no, request that the patient contact the pharmacy for the refill. If patient does not wish to contact the pharmacy document the reason why and proceed with request.) (Agent: If yes, when and what did the pharmacy advise?)  Is this the correct pharmacy for this prescription? No If no, delete pharmacy and type the correct one.  This is the patient's preferred pharmacy:  Pharmacy at [id number] [address] [place], [place] [address] Lenice Llamas [phone]   Has the prescription been filled recently? No  Is the patient out of the medication? No  Has the patient been seen for an appointment in the last year OR does the patient have an upcoming appointment? Yes  Can we respond through MyChart? Yes  Agent: Please be advised that Rx refills may take up to 3 business days. We ask that you follow-up with your pharmacy.

## 2023-05-13 NOTE — Assessment & Plan Note (Signed)
 Hyperlipidemia:Low fat diet discussed and encouraged.   Lipid Panel  Lab Results  Component Value Date   CHOL 141 01/11/2023   HDL 45 01/11/2023   LDLCALC 82 01/11/2023   TRIG 67 01/11/2023   CHOLHDL 3.1 01/11/2023

## 2023-05-13 NOTE — Assessment & Plan Note (Signed)
 Diabetes associated with hypertension, hyperlipidemia, obesity, and arthritis  Tami Pearson is reminded of the importance of commitment to daily physical activity for 30 minutes or more, as able and the need to limit carbohydrate intake to 30 to 60 grams per meal to help with blood sugar control.   The need to take medication as prescribed, test blood sugar as directed, and to call between visits if there is a concern that blood sugar is uncontrolled is also discussed.   Tami Pearson is reminded of the importance of daily foot exam, annual eye examination, and good blood sugar, blood pressure and cholesterol control.     Latest Ref Rng & Units 05/11/2023    2:35 PM 01/11/2023   10:00 AM 01/11/2023    8:58 AM 04/22/2022    9:52 AM 11/20/2021    9:59 AM  Diabetic Labs  HbA1c 4.8 - 5.6 % 6.5   6.4  6.7  6.4   Micro/Creat Ratio 0 - 29 mg/g creat  <3    4   Chol 100 - 199 mg/dL   956  213  086   HDL >57 mg/dL   45  53  49   Calc LDL 0 - 99 mg/dL   82  92  846   Triglycerides 0 - 149 mg/dL   67  962  72   Creatinine 0.57 - 1.00 mg/dL 9.52   8.41  3.24  4.01       05/11/2023    2:03 PM 05/11/2023    1:27 PM 01/04/2023    8:36 AM 08/02/2022    2:00 PM 07/29/2022    9:15 AM 04/22/2022   10:19 AM 04/22/2022    9:32 AM  BP/Weight  Systolic BP 160 163 120 117 124 152 152  Diastolic BP 90 90 80 72 82 84 84  Wt. (Lbs)  234 219  227.04 241   BMI  41.45 kg/m2 38.79 kg/m2  40.22 kg/m2 42.69 kg/m2       Latest Ref Rng & Units 12/01/2021   12:00 AM 06/03/2015    1:30 PM  Foot/eye exam completion dates  Eye Exam No Retinopathy No Retinopathy       Foot Form Completion   Done     This result is from an external source.

## 2023-05-14 DIAGNOSIS — Z1211 Encounter for screening for malignant neoplasm of colon: Secondary | ICD-10-CM | POA: Diagnosis not present

## 2023-05-14 LAB — COLOGUARD: Cologuard: NEGATIVE

## 2023-05-23 LAB — EXTERNAL GENERIC LAB PROCEDURE: COLOGUARD: NEGATIVE

## 2023-05-23 LAB — COLOGUARD: COLOGUARD: NEGATIVE

## 2023-06-06 ENCOUNTER — Telehealth: Payer: Self-pay

## 2023-06-06 NOTE — Telephone Encounter (Signed)
 Copied from CRM [id number]. Topic: Clinical - Medication Question >> Jun 06, 2023 10:51 AM Sophia H wrote: Reason for CRM: Pt stated she is having issues with her Ozempic , it is causing her stomach to cramp up - requesting to be put back on phentermine . Do not see in chart , best number [phone]

## 2023-06-09 NOTE — Telephone Encounter (Signed)
 Can you get pt scheduled for sooner ov to discuss med changes? I think there may be openings around 5/22

## 2023-06-09 NOTE — Telephone Encounter (Signed)
Appointment scheduled patient aware  

## 2023-06-24 ENCOUNTER — Ambulatory Visit: Admitting: Family Medicine

## 2023-06-27 ENCOUNTER — Other Ambulatory Visit: Payer: Self-pay | Admitting: Orthopedic Surgery

## 2023-06-30 ENCOUNTER — Encounter (HOSPITAL_COMMUNITY): Payer: Self-pay | Admitting: Psychiatry

## 2023-06-30 ENCOUNTER — Telehealth (HOSPITAL_COMMUNITY): Admitting: Psychiatry

## 2023-06-30 ENCOUNTER — Other Ambulatory Visit: Payer: Self-pay | Admitting: Family Medicine

## 2023-06-30 DIAGNOSIS — F5102 Adjustment insomnia: Secondary | ICD-10-CM

## 2023-06-30 DIAGNOSIS — E1169 Type 2 diabetes mellitus with other specified complication: Secondary | ICD-10-CM

## 2023-06-30 DIAGNOSIS — M549 Dorsalgia, unspecified: Secondary | ICD-10-CM

## 2023-06-30 DIAGNOSIS — M170 Bilateral primary osteoarthritis of knee: Secondary | ICD-10-CM

## 2023-06-30 DIAGNOSIS — F3341 Major depressive disorder, recurrent, in partial remission: Secondary | ICD-10-CM | POA: Diagnosis not present

## 2023-06-30 MED ORDER — DULOXETINE HCL 60 MG PO CPEP: 60.0000 mg | ORAL_CAPSULE | Freq: Two times a day (BID) | ORAL | 2 refills | Status: DC

## 2023-06-30 MED ORDER — BUPROPION HCL ER (XL) 150 MG PO TB24: 150.0000 mg | ORAL_TABLET | Freq: Every morning | ORAL | 3 refills | Status: DC

## 2023-06-30 MED ORDER — LORAZEPAM 0.5 MG PO TABS: 0.5000 mg | ORAL_TABLET | Freq: Every day | ORAL | 3 refills | Status: DC | PRN

## 2023-06-30 MED ORDER — TRAZODONE HCL 50 MG PO TABS: 50.0000 mg | ORAL_TABLET | Freq: Every day | ORAL | 3 refills | Status: DC
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
## 2023-07-13 ENCOUNTER — Encounter: Admitting: Family Medicine

## 2023-07-21 DIAGNOSIS — H52223 Regular astigmatism, bilateral: Secondary | ICD-10-CM | POA: Diagnosis not present

## 2023-07-21 DIAGNOSIS — H524 Presbyopia: Secondary | ICD-10-CM | POA: Diagnosis not present

## 2023-07-21 DIAGNOSIS — H5213 Myopia, bilateral: Secondary | ICD-10-CM | POA: Diagnosis not present

## 2023-08-09 ENCOUNTER — Ambulatory Visit: Payer: Medicare HMO

## 2023-08-09 VITALS — Ht 63.5 in | Wt 234.0 lb

## 2023-08-09 DIAGNOSIS — Z0000 Encounter for general adult medical examination without abnormal findings: Secondary | ICD-10-CM | POA: Diagnosis not present

## 2023-08-09 NOTE — Patient Instructions (Signed)
 Ms. Daugherty ,  Thank you for taking time out of your busy schedule to complete your Annual Wellness Visit with me. I enjoyed our conversation and look forward to speaking with you again next year. I, as well as your care team,  appreciate your ongoing commitment to your health goals. Please review the following plan we discussed and let me know if I can assist you in the future.  I enjoyed our conversation and look forward to it again next year. Blessing for the upcoming year!!  -Meagon Duskin  Your Game plan/ To Do List   Referrals Placed:  N/A Your mammogram is scheduled at [facility] in [place] for August 17, 2023  Follow up Visits:  PCP IN OFFICE: September 07, 2023 at 2:40 1 YEAR WITH HEALTH ADVISOR: August 13, 2024 at 8:40 am video visit.     Clinician Recommendations:  Aim for 30 minutes of exercise or brisk walking, 6-8 glasses of water, and 5 servings of fruits and vegetables each day.       This is a list of the screening recommended for you and due dates:  Health Maintenance  Topic Date Due   Hepatitis B Vaccine (1 of 3 - 19+ 3-dose series) Never done   Zoster (Shingles) Vaccine (1 of 2) Never done   Pneumococcal Vaccination (2 of 2 - PCV) 06/02/2016   DTaP/Tdap/Td vaccine (2 - Td or Tdap) 09/07/2022   COVID-19 Vaccine (1 - 2024-25 season) Never done   Eye exam for diabetics  12/02/2022   Mammogram  08/12/2023   Flu Shot  09/02/2023   Hemoglobin A1C  11/10/2023   Yearly kidney health urinalysis for diabetes  01/11/2024   Yearly kidney function blood test for diabetes  05/10/2024   Medicare Annual Wellness Visit  08/08/2024   Pap with HPV screening  12/31/2024   Cologuard (Stool DNA test)  05/15/2026   Hepatitis C Screening  Completed   HIV Screening  Completed   HPV Vaccine  Aged Out   Meningitis B Vaccine  Aged Out    Advanced directives: (Provided) Advance directive discussed with you today. I have provided a copy for you to complete at home and have notarized. Once  this is complete, please bring a copy in to our office so we can scan it into your chart.  Advance Care Planning is important because it:  [x]  Makes sure you receive the medical care that is consistent with your values, goals, and preferences  [x]  It provides guidance to your family and loved ones and reduces their decisional burden about whether or not they are making the right decisions based on your wishes.  Follow the link provided in your after visit summary or read over the paperwork we have mailed to you to help you started getting your Advance Directives in place. If you need assistance in completing these, please reach out to us  so that we can help you!
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
## 2023-08-12 ENCOUNTER — Ambulatory Visit: Admitting: Family Medicine

## 2023-08-15 ENCOUNTER — Other Ambulatory Visit: Payer: Self-pay

## 2023-08-15 MED ORDER — CARVEDILOL 3.125 MG PO TABS: 3.1250 mg | ORAL_TABLET | Freq: Two times a day (BID) | ORAL | 2 refills | Status: DC

## 2023-08-25 DIAGNOSIS — Z1231 Encounter for screening mammogram for malignant neoplasm of breast: Secondary | ICD-10-CM | POA: Diagnosis not present

## 2023-08-25 LAB — HM MAMMOGRAPHY

## 2023-08-30 ENCOUNTER — Encounter: Payer: Self-pay | Admitting: Family Medicine

## 2023-09-07 ENCOUNTER — Ambulatory Visit: Admitting: Family Medicine

## 2023-09-07 ENCOUNTER — Encounter: Admitting: Family Medicine

## 2023-09-15 ENCOUNTER — Other Ambulatory Visit: Payer: Self-pay | Admitting: Family Medicine

## 2023-09-15 DIAGNOSIS — I10 Essential (primary) hypertension: Secondary | ICD-10-CM

## 2023-10-06 ENCOUNTER — Encounter: Admitting: Family Medicine

## 2023-10-28 ENCOUNTER — Other Ambulatory Visit: Payer: Self-pay | Admitting: Family Medicine

## 2023-10-31 ENCOUNTER — Encounter (HOSPITAL_COMMUNITY): Payer: Self-pay | Admitting: Psychiatry

## 2023-10-31 ENCOUNTER — Telehealth (INDEPENDENT_AMBULATORY_CARE_PROVIDER_SITE_OTHER): Admitting: Psychiatry

## 2023-10-31 DIAGNOSIS — F5102 Adjustment insomnia: Secondary | ICD-10-CM

## 2023-10-31 DIAGNOSIS — F3341 Major depressive disorder, recurrent, in partial remission: Secondary | ICD-10-CM | POA: Diagnosis not present

## 2023-10-31 MED ORDER — DULOXETINE HCL 60 MG PO CPEP: 60.0000 mg | ORAL_CAPSULE | Freq: Two times a day (BID) | ORAL | 2 refills | Status: DC

## 2023-10-31 MED ORDER — TRAZODONE HCL 50 MG PO TABS: 50.0000 mg | ORAL_TABLET | Freq: Every day | ORAL | 3 refills | Status: DC

## 2023-10-31 MED ORDER — BUPROPION HCL ER (XL) 150 MG PO TB24: 150.0000 mg | ORAL_TABLET | Freq: Every morning | ORAL | 3 refills | Status: DC

## 2023-10-31 MED ORDER — LORAZEPAM 0.5 MG PO TABS: 0.5000 mg | ORAL_TABLET | Freq: Every day | ORAL | 3 refills | Status: DC | PRN
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
## 2023-11-01 DIAGNOSIS — R1010 Upper abdominal pain, unspecified: Secondary | ICD-10-CM | POA: Diagnosis not present

## 2023-11-01 DIAGNOSIS — R112 Nausea with vomiting, unspecified: Secondary | ICD-10-CM | POA: Diagnosis not present

## 2023-11-01 DIAGNOSIS — R1084 Generalized abdominal pain: Secondary | ICD-10-CM | POA: Diagnosis not present

## 2023-11-01 DIAGNOSIS — D7389 Other diseases of spleen: Secondary | ICD-10-CM | POA: Diagnosis not present

## 2023-11-01 DIAGNOSIS — E119 Type 2 diabetes mellitus without complications: Secondary | ICD-10-CM | POA: Diagnosis not present

## 2023-11-01 DIAGNOSIS — K5730 Diverticulosis of large intestine without perforation or abscess without bleeding: Secondary | ICD-10-CM | POA: Diagnosis not present

## 2023-11-01 DIAGNOSIS — I10 Essential (primary) hypertension: Secondary | ICD-10-CM | POA: Diagnosis not present

## 2023-11-01 DIAGNOSIS — K529 Noninfective gastroenteritis and colitis, unspecified: Secondary | ICD-10-CM | POA: Diagnosis not present

## 2023-11-01 DIAGNOSIS — E7800 Pure hypercholesterolemia, unspecified: Secondary | ICD-10-CM | POA: Diagnosis not present

## 2023-11-01 DIAGNOSIS — Z885 Allergy status to narcotic agent status: Secondary | ICD-10-CM | POA: Diagnosis not present

## 2023-11-07 ENCOUNTER — Other Ambulatory Visit: Payer: Self-pay | Admitting: Family Medicine

## 2023-11-18 ENCOUNTER — Other Ambulatory Visit: Payer: Self-pay | Admitting: Family Medicine

## 2024-01-20 NOTE — Progress Notes (Signed)
 Tami Pearson                                          MRN: [id number]   01/20/2024   The [facility] Quality Team Specialist reviewed this patient medical record for the purposes of chart review for care gap closure. The following were reviewed: chart review for care gap closure-kidney health evaluation for diabetes:uACR.    [facility]BCI Quality Team

## 2024-02-17 ENCOUNTER — Encounter: Admitting: Family Medicine

## 2024-02-21 ENCOUNTER — Encounter (HOSPITAL_COMMUNITY): Payer: Self-pay | Admitting: Psychiatry

## 2024-02-21 ENCOUNTER — Telehealth (HOSPITAL_COMMUNITY): Admitting: Psychiatry

## 2024-02-21 DIAGNOSIS — F5102 Adjustment insomnia: Secondary | ICD-10-CM

## 2024-02-21 DIAGNOSIS — F3341 Major depressive disorder, recurrent, in partial remission: Secondary | ICD-10-CM

## 2024-02-21 DIAGNOSIS — F5105 Insomnia due to other mental disorder: Secondary | ICD-10-CM | POA: Diagnosis not present

## 2024-02-21 MED ORDER — BUPROPION HCL ER (XL) 150 MG PO TB24: 150.0000 mg | ORAL_TABLET | Freq: Every morning | ORAL | 3 refills | Status: AC

## 2024-02-21 MED ORDER — LORAZEPAM 0.5 MG PO TABS: 0.5000 mg | ORAL_TABLET | Freq: Every day | ORAL | 3 refills | Status: AC | PRN

## 2024-02-21 MED ORDER — DULOXETINE HCL 60 MG PO CPEP: 60.0000 mg | ORAL_CAPSULE | Freq: Two times a day (BID) | ORAL | 2 refills | Status: AC

## 2024-02-21 MED ORDER — TRAZODONE HCL 50 MG PO TABS: 50.0000 mg | ORAL_TABLET | Freq: Every day | ORAL | 3 refills | Status: AC

## 2024-02-21 NOTE — Progress Notes (Signed)
 Virtual Visit via Video Note  I connected with Tami Pearson on 02/21/24 at  3:00 PM EST by a video enabled telemedicine application and verified that I am speaking with the correct person using two identifiers.  Location: Patient: home Provider: office   I discussed the limitations of evaluation and management by telemedicine and the availability of in person appointments. The patient expressed understanding and agreed to proceed.    I discussed the assessment and treatment plan with the patient. The patient was provided an opportunity to ask questions and all were answered. The patient agreed with the plan and demonstrated an understanding of the instructions.   The patient was advised to call back or seek an in-person evaluation if the symptoms worsen or if the condition fails to improve as anticipated.  I provided 20 minutes of non-face-to-face time during this encounter.   Barnie Gull, MD  [facility] MD/PA/NP OP Progress Note  02/21/2024 3:06 PM Tami Pearson  MRN:  [id number]  Chief Complaint:  Chief Complaint  Patient presents with   Depression   Anxiety   Follow-up   HPI: This patient is a 59 year old separated black female who lives with her grandson in [place].  She is on disability.  The patient returns for follow-up after 4 months regarding her major depression and generalized anxiety disorder.  The patient states that she has been doing well.  She is staying busy and still caring for her grandchildren fairly regularly.  She denies significant depression anxiety or difficulty sleeping.  She states that her mood has been as stable and she denies thoughts of self-harm or suicide.  She is sleeping well. Visit Diagnosis:    ICD-10-CM   1. Recurrent major depressive disorder, in partial remission  F33.41 buPROPion  (WELLBUTRIN  XL) 150 MG 24 hr tablet    2. Insomnia due to psychological stress  F51.02 traZODone  (DESYREL ) 50 MG tablet      Past Psychiatric History:  Several suicide attempts in the distant past. Primarily she has received outpatient treatment   Past Medical History:  Past Medical History:  Diagnosis Date   Anxiety    Arthritis 2010 approx   bilateral hip and knee pain   Bilateral shoulder pain 03/12/2017   Depression    Diabetes mellitus without complication (HCC)    Excessive subcutaneous fat 03/08/2018   Groin pain 10/25/2018   Gynecomastia 03/08/2018   History of 2019 novel coronavirus disease (COVID-19) 10/29/2018   Hospitalized at [facility], no mechanical ventilation   Hypercholesteremia    Hypertension    Metabolic syndrome X 09/10/2012   Neck pain 03/07/2017    Past Surgical History:  Procedure Laterality Date   ANKLE SURGERY Right 1991   [facility]    CARPAL TUNNEL RELEASE Right 12/10/2015   Procedure: CARPAL TUNNEL RELEASE;  Surgeon: Taft FORBES Minerva, MD;  Location: [facility];  Service: Orthopedics;  Laterality: Right;   TONSILLECTOMY     as child   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   Procedure: LEFT TOTAL HIP ARTHROPLASTY;  Surgeon: Dempsey LULLA Moan, MD;  Location: [facility];  Service: Orthopedics;  Laterality: Left;   TOTAL HIP ARTHROPLASTY Right 04/04/2013   Procedure: RIGHT TOTAL HIP ARTHROPLASTY;  Surgeon: Dempsey LULLA Moan, MD;  Location: [facility];  Service: Orthopedics;  Laterality: Right;   TUBAL LIGATION      Family Psychiatric History: See below  Family History:  Family History  Problem Relation Age of Onset   Diabetes Mother    Hypertension Mother  Ovarian cancer Mother        deceased at age 33    Depression Father    Alcohol abuse Father    Diabetes Sister    Hypertension Sister    Hyperlipidemia Sister    Diabetes Brother    Hypertension Brother    Hypertension Brother    Hypertension Brother    Depression Paternal Uncle    Colon cancer Neg Hx     Social History:  Social History   Socioeconomic History   Marital status: Divorced    Spouse name: Not on file   Number of children: 1   Years of education:  GED   Highest education level: GED or equivalent  Occupational History   Not on file  Tobacco Use   Smoking status: Never   Smokeless tobacco: Never  Vaping Use   Vaping status: Never Used  Substance and Sexual Activity   Alcohol use: Never   Drug use: Not Currently   Sexual activity: Not Currently    Birth control/protection: Surgical  Other Topics Concern   Not on file  Social History Narrative   Recently seperated. Grandson lives in home and helps her out with ADL's. Pt states she spends a majority of time with daughter.   Social Drivers of Health   Tobacco Use: Low Risk (02/21/2024)   Patient History    Smoking Tobacco Use: Never    Smokeless Tobacco Use: Never    Passive Exposure: Not on file  Financial Resource Strain: Low Risk (08/09/2023)   Overall Financial Resource Strain (CARDIA)    Difficulty of Paying Living Expenses: Not hard at all  Food Insecurity: No Food Insecurity (08/09/2023)   Epic    Worried About Radiation Protection Practitioner of Food in the Last Year: Never true    Ran Out of Food in the Last Year: Never true  Transportation Needs: No Transportation Needs (08/09/2023)   Epic    Lack of Transportation (Medical): No    Lack of Transportation (Non-Medical): No  Physical Activity: Patient Declined (08/09/2023)   Exercise Vital Sign    Days of Exercise per Week: Patient declined    Minutes of Exercise per Session: Patient declined  Stress: Stress Concern Present (08/09/2023)   [employer] of Occupational Health - Occupational Stress Questionnaire    Feeling of Stress: To some extent  Social Connections: Moderately Isolated (08/09/2023)   Social Connection and Isolation Panel    Frequency of Communication with Friends and Family: More than three times a week    Frequency of Social Gatherings with Friends and Family: Once a week    Attends Religious Services: More than 4 times per year    Active Member of Clubs or Organizations: No    Attends Banker Meetings:  Never    Marital Status: Divorced  Depression (PHQ2-9): Low Risk (08/09/2023)   Depression (PHQ2-9)    PHQ-2 Score: 1  Recent Concern: Depression (PHQ2-9) - Medium Risk (05/11/2023)   Depression (PHQ2-9)    PHQ-2 Score: 7  Alcohol Screen: Low Risk (08/09/2023)   Alcohol Screen    Last Alcohol Screening Score (AUDIT): 0  Housing: Low Risk (08/09/2023)   Epic    Unable to Pay for Housing in the Last Year: No    Number of Times Moved in the Last Year: 0    Homeless in the Last Year: No  Utilities: Not At Risk (08/09/2023)   Epic    Threatened with loss of utilities: No  Health Literacy: Adequate  Health Literacy (08/09/2023)   B1300 Health Literacy    Frequency of need for help with medical instructions: Never    Allergies: Allergies[1]  Metabolic Disorder Labs: Lab Results  Component Value Date   HGBA1C 6.5 (H) 05/11/2023   MPG 126 03/08/2018   MPG 123 10/18/2017   No results found for: PROLACTIN Lab Results  Component Value Date   CHOL 141 01/11/2023   TRIG 67 01/11/2023   HDL 45 01/11/2023   CHOLHDL 3.1 01/11/2023   VLDL 12 05/10/2016   LDLCALC 82 01/11/2023   LDLCALC 92 04/22/2022   Lab Results  Component Value Date   TSH 1.360 05/11/2023   TSH 2.780 04/22/2022    Therapeutic Level Labs: No results found for: LITHIUM No results found for: VALPROATE No results found for: CBMZ  Current Medications: Current Outpatient Medications  Medication Sig Dispense Refill   amLODipine  (NORVASC ) 10 MG tablet TAKE 1 TABLET EVERY DAY ( DOSE INCREASE ) 90 tablet 3   blood glucose meter kit and supplies Dispense based on patient and insurance preference. Once daily testing DX E11.9 1 each 0   buPROPion  (WELLBUTRIN  XL) 150 MG 24 hr tablet Take 1 tablet (150 mg total) by mouth every morning. 90 tablet 3   carvedilol  (COREG ) 3.125 MG tablet TAKE 1 TABLET TWICE DAILY WITH MEALS 180 tablet 3   DULoxetine  (CYMBALTA ) 60 MG capsule Take 1 capsule (60 mg total) by mouth 2 (two) times  daily. 180 capsule 2   gabapentin  (NEURONTIN ) 400 MG capsule TAKE 1 CAPSULE IN THE MORNING AND TAKE 2 CAPSULES AT BEDTIME 270 capsule 3   glucose blood test strip Use as instructed 100 each 12   Lancets 30G MISC Once daily testing dx e11.9 100 each 5   LORazepam  (ATIVAN ) 0.5 MG tablet Take 1 tablet (0.5 mg total) by mouth daily as needed for anxiety. 30 tablet 3   meloxicam  (MOBIC ) 7.5 MG tablet TAKE 1 TABLET IN THE MORNING AND AT BEDTIME. 180 tablet 3   methocarbamol  (ROBAXIN ) 750 MG tablet Take 1 tablet (750 mg total) by mouth 4 (four) times daily. 60 tablet 2   rosuvastatin  (CRESTOR ) 20 MG tablet TAKE 1 TABLET EVERY DAY 90 tablet 3   spironolactone -hydrochlorothiazide  (ALDACTAZIDE) 25-25 MG tablet TAKE 1 TABLET EVERY DAY 90 tablet 3   traZODone  (DESYREL ) 50 MG tablet Take 1 tablet (50 mg total) by mouth at bedtime. 90 tablet 3   No current facility-administered medications for this visit.     Musculoskeletal: Strength & Muscle Tone: within normal limits Gait & Station: normal Patient leans: N/A  Psychiatric Specialty Exam: Review of Systems  All other systems reviewed and are negative.   Last menstrual period 03/18/2013.There is no height or weight on file to calculate BMI.  General Appearance: Casual and Fairly Groomed  Eye Contact:  Good  Speech:  Clear and Coherent  Volume:  Normal  Mood:  Euthymic  Affect:  Congruent  Thought Process:  Goal Directed  Orientation:  Full (Time, Place, and Person)  Thought Content: WDL   Suicidal Thoughts:  No  Homicidal Thoughts:  No  Memory:  Immediate;   Good Recent;   Good Remote;   NA  Judgement:  Good  Insight:  Fair  Psychomotor Activity:  Normal  Concentration:  Concentration: Good and Attention Span: Good  Recall:  Good  Fund of Knowledge: Good  Language: Good  Akathisia:  No  Handed:  Right  AIMS (if indicated): not done  Assets:  Communication  Skills Desire for Improvement Physical Health Resilience Social Support   ADL's:  Intact  Cognition: WNL  Sleep:  Good   Screenings: GAD-7    Flowsheet Row Office Visit from 05/11/2023 in [facility] Office Visit from 07/29/2022 in [facility] Office Visit from 04/22/2022 in [facility] Office Visit from 11/13/2019 in [facility]  Total GAD-7 Score 4 7 1 13    PHQ2-9    Flowsheet Row Clinical Support from 08/09/2023 in [facility] Office Visit from 05/11/2023 in [facility] Office Visit from 08/02/2022 in [facility] Office Visit from 07/29/2022 in [facility] Office Visit from 04/22/2022 in [facility]  PHQ-2 Total Score 1 2 4 4  0  PHQ-9 Total Score 1 7 -- 12 3   Flowsheet Row Video Visit from 10/07/2021 in [facility] [place] Video Visit from 07/08/2021 in [facility] [place] Video Visit from 03/10/2021 in [facility] [place]  C-SSRS RISK CATEGORY No Risk No Risk No Risk     Assessment and Plan: This patient is a 59 year old female with a history of major depression anxiety and insomnia.  She still feels that her medications are helpful.  She will continue Wellbutrin  XL 150 mg daily as well as Cymbalta  60 mg daily for major depression, Ativan  0.5 mg once daily as needed for anxiety and trazodone  50 mg at bedtime for insomnia.  She will return to see me in 4 months  Collaboration of Care: Collaboration of Care: Primary Care Provider AEB notes are shared with PCP on the epic system  Patient/Guardian was advised Release of Information must be obtained prior to any record release in order to collaborate their care with an outside provider. Patient/Guardian was advised if they have not already done so to contact the registration department to sign all necessary forms  in order for us  to release information regarding their care.   Consent: Patient/Guardian gives verbal consent for treatment and assignment of benefits for services provided during this visit. Patient/Guardian expressed understanding and agreed to proceed.    Barnie Gull, MD 02/21/2024, 3:06 PM     [1]  Allergies Allergen Reactions   Lisinopril  Cough   Other    Ibuprofen Nausea And Vomiting   Tramadol  Nausea Only

## 2024-03-29 ENCOUNTER — Encounter: Payer: Self-pay | Admitting: Family Medicine

## 2024-06-20 ENCOUNTER — Telehealth (HOSPITAL_COMMUNITY): Admitting: Psychiatry

## 2024-08-13 ENCOUNTER — Ambulatory Visit

## 2024-08-14 ENCOUNTER — Ambulatory Visit
